# Patient Record
Sex: Female | Born: 1960 | Race: White | Hispanic: No | Smoking: Former smoker
Health system: Southern US, Academic
[De-identification: ages and names within clinical notes are randomized; demographics above are authoritative.]

## PROBLEM LIST (undated history)

## (undated) DIAGNOSIS — H409 Unspecified glaucoma: Secondary | ICD-10-CM

## (undated) DIAGNOSIS — E079 Disorder of thyroid, unspecified: Secondary | ICD-10-CM

## (undated) DIAGNOSIS — K219 Gastro-esophageal reflux disease without esophagitis: Secondary | ICD-10-CM

## (undated) DIAGNOSIS — I10 Essential (primary) hypertension: Secondary | ICD-10-CM

## (undated) DIAGNOSIS — H401330 Pigmentary glaucoma, bilateral, stage unspecified: Secondary | ICD-10-CM

## (undated) DIAGNOSIS — E785 Hyperlipidemia, unspecified: Secondary | ICD-10-CM

## (undated) DIAGNOSIS — Z972 Presence of dental prosthetic device (complete) (partial): Secondary | ICD-10-CM

## (undated) DIAGNOSIS — R32 Unspecified urinary incontinence: Secondary | ICD-10-CM

## (undated) DIAGNOSIS — J45909 Unspecified asthma, uncomplicated: Secondary | ICD-10-CM

## (undated) DIAGNOSIS — D649 Anemia, unspecified: Secondary | ICD-10-CM

## (undated) DIAGNOSIS — J309 Allergic rhinitis, unspecified: Secondary | ICD-10-CM

## (undated) DIAGNOSIS — G2581 Restless legs syndrome: Secondary | ICD-10-CM

## (undated) DIAGNOSIS — E063 Autoimmune thyroiditis: Secondary | ICD-10-CM

## (undated) DIAGNOSIS — T7840XA Allergy, unspecified, initial encounter: Secondary | ICD-10-CM

## (undated) DIAGNOSIS — R400 Somnolence: Secondary | ICD-10-CM

## (undated) DIAGNOSIS — J189 Pneumonia, unspecified organism: Secondary | ICD-10-CM

## (undated) DIAGNOSIS — K317 Polyp of stomach and duodenum: Secondary | ICD-10-CM

## (undated) DIAGNOSIS — I499 Cardiac arrhythmia, unspecified: Secondary | ICD-10-CM

## (undated) DIAGNOSIS — B019 Varicella without complication: Secondary | ICD-10-CM

## (undated) DIAGNOSIS — M1990 Unspecified osteoarthritis, unspecified site: Secondary | ICD-10-CM

## (undated) DIAGNOSIS — I471 Supraventricular tachycardia, unspecified: Secondary | ICD-10-CM

## (undated) DIAGNOSIS — S83206A Unspecified tear of unspecified meniscus, current injury, right knee, initial encounter: Secondary | ICD-10-CM

## (undated) DIAGNOSIS — E039 Hypothyroidism, unspecified: Secondary | ICD-10-CM

## (undated) DIAGNOSIS — Z8719 Personal history of other diseases of the digestive system: Secondary | ICD-10-CM

## (undated) DIAGNOSIS — N3941 Urge incontinence: Secondary | ICD-10-CM

## (undated) DIAGNOSIS — K760 Fatty (change of) liver, not elsewhere classified: Secondary | ICD-10-CM

## (undated) DIAGNOSIS — J4540 Moderate persistent asthma, uncomplicated: Secondary | ICD-10-CM

## (undated) DIAGNOSIS — B029 Zoster without complications: Secondary | ICD-10-CM

## (undated) DIAGNOSIS — K449 Diaphragmatic hernia without obstruction or gangrene: Secondary | ICD-10-CM

## (undated) DIAGNOSIS — R490 Dysphonia: Secondary | ICD-10-CM

## (undated) DIAGNOSIS — F32A Depression, unspecified: Secondary | ICD-10-CM

## (undated) DIAGNOSIS — I83893 Varicose veins of bilateral lower extremities with other complications: Secondary | ICD-10-CM

## (undated) DIAGNOSIS — D259 Leiomyoma of uterus, unspecified: Secondary | ICD-10-CM

## (undated) HISTORY — DX: Gastro-esophageal reflux disease without esophagitis: K21.9

## (undated) HISTORY — DX: Varicella without complication: B01.9

## (undated) HISTORY — DX: Anemia, unspecified: D64.9

## (undated) HISTORY — DX: Zoster without complications: B02.9

## (undated) HISTORY — DX: Pigmentary glaucoma, bilateral, stage unspecified: H40.1330

## (undated) HISTORY — DX: Leiomyoma of uterus, unspecified: D25.9

## (undated) HISTORY — DX: Allergy, unspecified, initial encounter: T78.40XA

## (undated) HISTORY — DX: Unspecified osteoarthritis, unspecified site: M19.90

## (undated) HISTORY — DX: Unspecified urinary incontinence: R32

## (undated) HISTORY — DX: Polyp of stomach and duodenum: K31.7

## (undated) HISTORY — DX: Diaphragmatic hernia without obstruction or gangrene: K44.9

## (undated) HISTORY — PX: EYE SURGERY: SHX253

## (undated) HISTORY — PX: HX SINUS SURGERY: 2100001108

## (undated) HISTORY — PX: HX LAP CHOLECYSTECTOMY: SHX56

## (undated) HISTORY — DX: Unspecified glaucoma: H40.9

## (undated) HISTORY — DX: Disorder of thyroid, unspecified: E07.9

---

## 1998-01-11 HISTORY — PX: NASAL SINUS SURGERY: SHX719

## 2000-01-12 HISTORY — PX: CHOLECYSTECTOMY, LAPAROSCOPIC: SHX56

## 2000-01-12 HISTORY — PX: CHOLECYSTECTOMY: SHX55

## 2012-01-12 HISTORY — PX: COLONOSCOPY: SHX174

## 2015-05-03 ENCOUNTER — Encounter (HOSPITAL_BASED_OUTPATIENT_CLINIC_OR_DEPARTMENT_OTHER): Payer: Self-pay | Admitting: Emergency Medicine

## 2015-05-03 ENCOUNTER — Emergency Department (HOSPITAL_BASED_OUTPATIENT_CLINIC_OR_DEPARTMENT_OTHER)
Admission: EM | Admit: 2015-05-03 | Discharge: 2015-05-04 | Disposition: A | Discharge status: Home / Self Care | Payer: BLUE CROSS/BLUE SHIELD | Attending: Emergency Medicine | Admitting: Emergency Medicine

## 2015-05-03 DIAGNOSIS — M549 Dorsalgia, unspecified: Secondary | ICD-10-CM | POA: Insufficient documentation

## 2015-05-03 DIAGNOSIS — Z9049 Acquired absence of other specified parts of digestive tract: Secondary | ICD-10-CM | POA: Insufficient documentation

## 2015-05-03 DIAGNOSIS — R1011 Right upper quadrant pain: Secondary | ICD-10-CM | POA: Insufficient documentation

## 2015-05-03 DIAGNOSIS — J45909 Unspecified asthma, uncomplicated: Secondary | ICD-10-CM | POA: Insufficient documentation

## 2015-05-03 DIAGNOSIS — R10A0 Flank pain, unspecified side: Secondary | ICD-10-CM

## 2015-05-03 DIAGNOSIS — Z79899 Other long term (current) drug therapy: Secondary | ICD-10-CM | POA: Diagnosis not present

## 2015-05-03 DIAGNOSIS — R109 Unspecified abdominal pain: Secondary | ICD-10-CM

## 2015-05-03 HISTORY — DX: Disorder of thyroid, unspecified: E07.9

## 2015-05-03 HISTORY — DX: Unspecified asthma, uncomplicated: J45.909

## 2015-05-03 LAB — URINALYSIS, ROUTINE W REFLEX MICROSCOPIC
Bilirubin Urine: NEGATIVE
Glucose, UA: NEGATIVE mg/dL
Hgb urine dipstick: NEGATIVE
Ketones, ur: NEGATIVE mg/dL
Leukocytes, UA: NEGATIVE
Nitrite: NEGATIVE
Protein, ur: NEGATIVE mg/dL
Specific Gravity, Urine: 1.004 — ABNORMAL LOW (ref 1.005–1.030)
pH: 5.5 (ref 5.0–8.0)

## 2015-05-03 LAB — COMPREHENSIVE METABOLIC PANEL
ALT: 27 U/L (ref 14–54)
AST: 27 U/L (ref 15–41)
Albumin: 4.2 g/dL (ref 3.5–5.0)
Alkaline Phosphatase: 61 U/L (ref 38–126)
Anion gap: 13 (ref 5–15)
BUN: 17 mg/dL (ref 6–20)
CO2: 21 mmol/L — ABNORMAL LOW (ref 22–32)
Calcium: 9.1 mg/dL (ref 8.9–10.3)
Chloride: 106 mmol/L (ref 101–111)
Creatinine, Ser: 0.77 mg/dL (ref 0.44–1.00)
GFR calc Af Amer: >60 mL/min (ref >60)
GFR calc non Af Amer: >60 mL/min (ref >60)
Glucose, Bld: 91 mg/dL (ref 65–99)
Potassium: 3.8 mmol/L (ref 3.5–5.1)
Sodium: 140 mmol/L (ref 135–145)
Total Bilirubin: 0.4 mg/dL (ref 0.3–1.2)
Total Protein: 7.7 g/dL (ref 6.5–8.1)

## 2015-05-03 LAB — CBC WITH DIFFERENTIAL/PLATELET
Basophils Absolute: 0.1 10*3/uL (ref 0.0–0.1)
Basophils Relative: 1 %
Eosinophils Absolute: 0.4 10*3/uL (ref 0.0–0.7)
Eosinophils Relative: 4 %
HCT: 37.4 % (ref 36.0–46.0)
Hemoglobin: 12.4 g/dL (ref 12.0–15.0)
Lymphocytes Relative: 27 %
Lymphs Abs: 2.7 10*3/uL (ref 0.7–4.0)
MCH: 34.3 pg — ABNORMAL HIGH (ref 26.0–34.0)
MCHC: 33.2 g/dL (ref 30.0–36.0)
MCV: 103.3 fL — ABNORMAL HIGH (ref 78.0–100.0)
Monocytes Absolute: 0.8 10*3/uL (ref 0.1–1.0)
Monocytes Relative: 8 %
Neutro Abs: 6.3 10*3/uL (ref 1.7–7.7)
Neutrophils Relative %: 60 %
Platelets: 390 10*3/uL (ref 150–400)
RBC: 3.62 MIL/uL — ABNORMAL LOW (ref 3.87–5.11)
RDW: 11.8 % (ref 11.5–15.5)
WBC: 10.3 10*3/uL (ref 4.0–10.5)

## 2015-05-03 NOTE — ED Provider Notes (Signed)
CSN: [id number]     Arrival date & time 05/03/15  2215 History  By signing my name below, I, Cathy Baxter, attest that this documentation has been prepared under the direction and in the presence of Everlene Balls, MD . Electronically Signed: Rowan Baxter, Scribe. 05/03/2015. 11:33 PM.   Chief Complaint  Patient presents with  . Flank Pain   The history is provided by the patient. No language interpreter was used.   HPI Comments:  Cathy Baxter is a 55 y.o. female who presents to the Emergency Department complaining of intermittent stabbing right flank pain for the past 3-4 months. Friend reports pt can drink one beer with occasional relief; no exacerbating factors noted. Pt reports associated back pain today. She has not seen her PCP for her symptoms. Pt reports past cholecystectomy. Denies vomiting, diarrhea, dysuria, hematuria, fever, irregular vaginal bleeding, or irregular vaginal discharge.  Past Medical History  Diagnosis Date  . Thyroid disease   . Asthma    Past Surgical History  Procedure Laterality Date  . Cholecystectomy     History reviewed. No pertinent family history. Social History  Substance Use Topics  . Smoking status: Never Smoker   . Smokeless tobacco: None  . Alcohol Use: 1.2 - 1.8 oz/week    2-3 Cans of beer per week     Comment: 2 -3 days    OB History    No data available     Review of Systems  Gastrointestinal: Negative for vomiting and diarrhea.  Genitourinary: Positive for flank pain. Negative for dysuria, hematuria, vaginal bleeding and vaginal discharge.  Musculoskeletal: Positive for back pain.  All other systems reviewed and are negative.   Allergies  Percocet; Amoxicillin; and Demerol  Home Medications   Prior to Admission medications   Medication Sig Start Date End Date Taking? Authorizing Provider  levothyroxine (SYNTHROID, LEVOTHROID) 50 MCG tablet Take 50 mcg by mouth daily before breakfast.   Yes Historical Provider, MD   mometasone-formoterol (DULERA) 100-5 MCG/ACT AERO Inhale 2 puffs into the lungs 2 (two) times daily.   Yes Historical Provider, MD   BP 115/82 mmHg  Pulse 85  Temp(Src) 97.8 F (36.6 C) (Oral)  Resp 18  Ht 5\' 3"  (1.6 m)  Wt 160 lb (72.576 kg)  BMI 28.35 kg/m2  SpO2 99% Physical Exam  Constitutional: She is oriented to person, place, and time. She appears well-developed and well-nourished. No distress.  HENT:  Head: Normocephalic and atraumatic.  Nose: Nose normal.  Mouth/Throat: Oropharynx is clear and moist. No oropharyngeal exudate.  Eyes: Conjunctivae and EOM are normal. Pupils are equal, round, and reactive to light. No scleral icterus.  Neck: Normal range of motion. Neck supple. No JVD present. No tracheal deviation present. No thyromegaly present.  Cardiovascular: Normal rate, regular rhythm and normal heart sounds.  Exam reveals no gallop and no friction rub.   No murmur heard. Pulmonary/Chest: Effort normal and breath sounds normal. No respiratory distress. She has no wheezes. She exhibits no tenderness.  Abdominal: Soft. Bowel sounds are normal. She exhibits no distension and no mass. There is no tenderness. There is no rebound and no guarding.  Musculoskeletal: Normal range of motion. She exhibits no edema or tenderness.  Lymphadenopathy:    She has no cervical adenopathy.  Neurological: She is alert and oriented to person, place, and time. No cranial nerve deficit. She exhibits normal muscle tone.  Skin: Skin is warm and dry. No rash noted. No erythema. No pallor.  Nursing note and  vitals reviewed.   ED Course  Procedures  DIAGNOSTIC STUDIES:  Oxygen Saturation is 96% on RA, adequate by my interpretation.    COORDINATION OF CARE:  11:29 PM Will evaluate blood work. Recommended pt follow-up with OB/GYN for vaginal [place]. Discussed treatment plan with pt at bedside and pt agreed to plan.  Labs Review Labs Reviewed  CBC WITH DIFFERENTIAL/PLATELET - Abnormal; Notable  for the following:    RBC 3.62 (*)    MCV 103.3 (*)    MCH 34.3 (*)    All other components within normal limits  COMPREHENSIVE METABOLIC PANEL - Abnormal; Notable for the following:    CO2 21 (*)    All other components within normal limits  URINALYSIS, ROUTINE W REFLEX MICROSCOPIC (NOT AT [facility]) - Abnormal; Notable for the following:    Specific Gravity, Urine 1.004 (*)    All other components within normal limits  LIPASE, BLOOD    Imaging Review No results found. I have personally reviewed and evaluated these images and lab results as part of my medical decision-making.   EKG Interpretation None      MDM   Final diagnoses:  Flank pain   Patient presents emergency department for abdominal pain. She states this has been going on for several months. Laboratory studies are unremarkable, physical exam reveals no tenderness. Patient advised to follow-up with primary care physician for further evaluation and possible vaginal ultrasound to evaluate for fibroids or cysts. She images good understanding. She appears well in no acute distress, vital signs were within her normal limits and she is safe for discharge.   I personally performed the services described in this documentation, which was scribed in my presence. The recorded information has been reviewed and is accurate.      Everlene Balls, MD 05/04/15 [phone]

## 2015-05-03 NOTE — ED Notes (Signed)
Patient reports that she has had right flank pain x "months" that patient. Is giggling and in no distress in triage. The patient reports that she came tonight because " I did not want to live with this all my life"

## 2015-05-03 NOTE — Discharge Instructions (Signed)
Abdominal Pain, Adult Cathy Baxter, your blood work and urine results are normal.  See a primary care physician within 3 days for close follow up.  Take tylenol or ibuprofen as needed for pain control.  If symptoms worsen, come back to the ED immediately. Thank you. Many things can cause belly (abdominal) pain. Most times, the belly pain is not dangerous. Many cases of belly pain can be watched and treated at home. HOME CARE   Do not take medicines that help you go poop (laxatives) unless told to by your doctor.  Only take medicine as told by your doctor.  Eat or drink as told by your doctor. Your doctor will tell you if you should be on a special diet. GET HELP IF:  You do not know what is causing your belly pain.  You have belly pain while you are sick to your stomach (nauseous) or have runny poop (diarrhea).  You have pain while you pee or poop.  Your belly pain wakes you up at night.  You have belly pain that gets worse or better when you eat.  You have belly pain that gets worse when you eat fatty foods.  You have a fever. GET HELP RIGHT AWAY IF:   The pain does not go away within 2 hours.  You keep throwing up (vomiting).  The pain changes and is only in the right or left part of the belly.  You have bloody or tarry looking poop. MAKE SURE YOU:   Understand these instructions.  Will watch your condition.  Will get help right away if you are not doing well or get worse.   This information is not intended to replace advice given to you by your health care provider. Make sure you discuss any questions you have with your health care provider.   Document Released: 06/16/2007 Document Revised: 01/18/2014 Document Reviewed: 09/06/2012 Elsevier Interactive Patient Education 2016 [employer]  Flank Pain Flank pain is pain in your side. The flank is the area of your side between your upper belly (abdomen) and your back. Pain in this area can be caused by many different  things. [place] care and treatment will depend on the cause of your pain.  Rest as told by your doctor.  Drink enough fluids to keep your pee (urine) clear or pale yellow.  Only take medicine as told by your doctor.  Tell your doctor about any changes in your pain.  Follow up with your doctor. GET HELP RIGHT AWAY IF:   Your pain does not get better with medicine.   You have new symptoms or your symptoms get worse.  Your pain gets worse.   You have belly (abdominal) pain.   You are short of breath.   You always feel sick to your stomach (nauseous).   You keep throwing up (vomiting).   You have puffiness (swelling) in your belly.   You feel light-headed or you pass out (faint).   You have blood in your pee.  You have a fever or lasting symptoms for more than 2-3 days.  You have a fever and your symptoms suddenly get worse. MAKE SURE YOU:   Understand these instructions.  Will watch your condition.  Will get help right away if you are not doing well or get worse.   This information is not intended to replace advice given to you by your health care provider. Make sure you discuss any questions you have with your health care provider.  Document Released: 10/07/2007 Document Revised: 01/18/2014 Document Reviewed: 08/12/2011 Elsevier Interactive Patient Education [employer].

## 2015-05-03 NOTE — ED Notes (Addendum)
Denies any sx at this time. Reports intermitant RUQ pain for months. Has had pain and nausea. (denies: vd, fever, bleeding, urinary or vaginal sx, last BM yesterday, "thought she was constipated", last ate 1900. H/o gall bladder surgery and hiatal hernia. Alert, NAD, calm, interactive.

## 2015-05-04 LAB — LIPASE, BLOOD: Lipase: 21 U/L (ref 11–51)

## 2015-05-04 NOTE — ED Notes (Signed)
Pt given d/c instructions as per chart. Verbalizes understanding. No questions. 

## 2015-05-08 ENCOUNTER — Inpatient Hospital Stay: Payer: BLUE CROSS/BLUE SHIELD

## 2015-05-09 ENCOUNTER — Ambulatory Visit: Payer: BLUE CROSS/BLUE SHIELD | Attending: Physician Assistant | Admitting: Physician Assistant

## 2015-05-09 VITALS — BP 118/81 | HR 70 | Temp 98.3°F | Resp 16 | Ht 63.0 in | Wt 165.0 lb

## 2015-05-09 DIAGNOSIS — Z79899 Other long term (current) drug therapy: Secondary | ICD-10-CM | POA: Insufficient documentation

## 2015-05-09 DIAGNOSIS — Z9049 Acquired absence of other specified parts of digestive tract: Secondary | ICD-10-CM | POA: Diagnosis not present

## 2015-05-09 DIAGNOSIS — G8929 Other chronic pain: Secondary | ICD-10-CM | POA: Diagnosis not present

## 2015-05-09 DIAGNOSIS — R1031 Right lower quadrant pain: Secondary | ICD-10-CM | POA: Insufficient documentation

## 2015-05-09 DIAGNOSIS — Z880 Allergy status to penicillin: Secondary | ICD-10-CM | POA: Diagnosis not present

## 2015-05-09 MED ORDER — FLUTICASONE PROPIONATE 50 MCG/ACT NA SUSP: 2.0000 | Freq: Every day | NASAL | Status: DC

## 2015-05-09 MED ORDER — OMEPRAZOLE 40 MG PO CPDR: 40.0000 mg | DELAYED_RELEASE_CAPSULE | Freq: Every day | ORAL | Status: DC

## 2015-05-09 NOTE — Progress Notes (Signed)
Patient's here for ED f/up flank pain mid and lower back  rating pain at 8/10.  Patient is also experiencing pain in L hip, that started this morning, rated pain 8/10.

## 2015-05-09 NOTE — Progress Notes (Signed)
Cathy Baxter  [id number]  [id number]  DOB - 08-Apr-1960  Chief Complaint  Patient presents with  . Follow-up  . Flank Pain       Subjective:   Cathy Baxter is a 55 y.o. female here today for establishment of care. She was in the emergency department on 05/03/2015. She is new to this area. She states that maybe one year ago she had some abdominal pain and was seen by gastroenterology. She had endoscopy and colonoscopy which looked okay. Since that time she's had a CT of the abdomen and that was reportedly normal as well. I do not have any records, again she recently moved to this area. Paragraph on 422 she went to our emergency department with complaints of stabbing right flank and abdominal pain. Located to the right lower quadrant through to her back occasionally. It's been going on for the last 3-4 months almost daily. No nausea or vomiting. No dysuria. No fevers. No vaginal bleeding or vaginal discharge. No injury or trauma. She's been afebrile. She stopped taking Protonix and Bentyl lately because she didn't feel like they were working. Also she states that the Protonix cost too much. In the emergency department her labs looked okay. Her urinalysis looked okay. She does not describe anything at that visit.  ROS: GEN: denies fever or chills, denies change in weight Skin: denies lesions or rashes HEENT: denies headache, earache, epistaxis, sore throat, or neck pain LUNGS: denies SHOB, dyspnea, PND, orthopnea CV: denies CP or palpitations ABD: denies abd pain, N or V EXT: denies muscle spasms or swelling; no pain in lower ext, no weakness NEURO: denies numbness or tingling, denies sz, stroke or TIA  Problem  Abdominal Pain, Chronic, Right Lower Quadrant    ALLERGIES: Allergies  Allergen Reactions  . Percocet [Oxycodone-Acetaminophen] Shortness Of Breath  . Amoxicillin Hives  . Demerol [Meperidine] Palpitations    PAST MEDICAL HISTORY: Past Medical History    Diagnosis Date  . Thyroid disease   . Asthma     PAST SURGICAL HISTORY: Past Surgical History  Procedure Laterality Date  . Cholecystectomy      MEDICATIONS AT HOME: Prior to Admission medications   Medication Sig Start Date End Date Taking? Authorizing Provider  cetirizine (ZYRTEC) 10 MG tablet Take 10 mg by mouth daily.   Yes Historical Provider, MD  dicyclomine (BENTYL) 20 MG tablet Take 20 mg by mouth every 6 (six) hours.   Yes Historical Provider, MD  levothyroxine (SYNTHROID, LEVOTHROID) 50 MCG tablet Take 50 mcg by mouth daily before breakfast.   Yes Historical Provider, MD  mometasone-formoterol (DULERA) 100-5 MCG/ACT AERO Inhale 2 puffs into the lungs 2 (two) times daily.   Yes Historical Provider, MD  fluticasone (FLONASE) 50 MCG/ACT nasal spray Place 2 sprays into both nostrils daily. 05/09/15   Tiffany Daneil Dan, PA-C  omeprazole (PRILOSEC) 40 MG capsule Take 1 capsule (40 mg total) by mouth daily. 05/09/15   Tiffany Daneil Dan, PA-C  pantoprazole (PROTONIX) 40 MG tablet Take 40 mg by mouth daily. Reported on 05/09/2015    Historical Provider, MD     Objective:   Filed Vitals:   05/09/15 1401  BP: 118/81  Pulse: 70  Temp: 98.3 F (36.8 C)  TempSrc: Oral  Resp: 16  Height: 5\' 3"  (1.6 m)  Weight: 165 lb (74.844 kg)  SpO2: 98%    Exam General appearance : Awake, alert, not in any distress. Speech Clear. Not toxic looking HEENT: Atraumatic and Normocephalic, pupils equally reactive  to light and accomodation Neck: supple, no JVD. No cervical lymphadenopathy.   Abdomen: Bowel sounds present, slightly diffusely tender and not distended with no guarding, rigidity or rebound. Extremities: B/L Lower Ext shows no edema, both legs are warm to touch Neurology: Awake alert, and oriented X 3, CN II-XII intact, Non focal Skin:No Rash   Assessment & Plan  1. Acute on Chronic RLQ Abdominal Pain/right flank pain unclear etiology  -Restart Bentyl at higher dose  -Restart  PPI  -GYN referral  -Made need GI referral if sxs persists   Return in about 4 weeks (around 06/06/2015).  The patient was given clear instructions to go to ER or return to medical center if symptoms don't improve, worsen or new problems develop. The patient verbalized understanding. The patient was told to call to get lab results if they haven't heard anything in the next week.   This note has been created with Surveyor, quantity. Any transcriptional errors are unintentional.    Zettie Pho, PA-C [facility] [place], [place]   05/09/2015, 2:33 PM

## 2015-05-14 ENCOUNTER — Encounter: Payer: Self-pay | Admitting: Obstetrics and Gynecology

## 2015-06-09 ENCOUNTER — Ambulatory Visit: Payer: BLUE CROSS/BLUE SHIELD | Admitting: Family Medicine

## 2015-06-12 ENCOUNTER — Encounter: Payer: BLUE CROSS/BLUE SHIELD | Admitting: Obstetrics and Gynecology

## 2015-06-13 ENCOUNTER — Ambulatory Visit: Payer: BLUE CROSS/BLUE SHIELD | Admitting: Family Medicine

## 2015-07-13 ENCOUNTER — Emergency Department (HOSPITAL_COMMUNITY): Payer: BLUE CROSS/BLUE SHIELD

## 2015-07-13 ENCOUNTER — Emergency Department (HOSPITAL_COMMUNITY)
Admission: EM | Admit: 2015-07-13 | Discharge: 2015-07-13 | Disposition: A | Discharge status: Home / Self Care | Payer: BLUE CROSS/BLUE SHIELD | Attending: Emergency Medicine | Admitting: Emergency Medicine

## 2015-07-13 ENCOUNTER — Encounter (HOSPITAL_COMMUNITY): Payer: Self-pay | Admitting: *Deleted

## 2015-07-13 DIAGNOSIS — Z79899 Other long term (current) drug therapy: Secondary | ICD-10-CM | POA: Insufficient documentation

## 2015-07-13 DIAGNOSIS — Y9389 Activity, other specified: Secondary | ICD-10-CM | POA: Insufficient documentation

## 2015-07-13 DIAGNOSIS — Z7951 Long term (current) use of inhaled steroids: Secondary | ICD-10-CM | POA: Insufficient documentation

## 2015-07-13 DIAGNOSIS — Y990 Civilian activity done for income or pay: Secondary | ICD-10-CM | POA: Diagnosis not present

## 2015-07-13 DIAGNOSIS — Y929 Unspecified place or not applicable: Secondary | ICD-10-CM | POA: Insufficient documentation

## 2015-07-13 DIAGNOSIS — S82832A Other fracture of upper and lower end of left fibula, initial encounter for closed fracture: Secondary | ICD-10-CM | POA: Insufficient documentation

## 2015-07-13 DIAGNOSIS — J45909 Unspecified asthma, uncomplicated: Secondary | ICD-10-CM | POA: Insufficient documentation

## 2015-07-13 DIAGNOSIS — X501XXA Overexertion from prolonged static or awkward postures, initial encounter: Secondary | ICD-10-CM | POA: Diagnosis not present

## 2015-07-13 DIAGNOSIS — S8992XA Unspecified injury of left lower leg, initial encounter: Secondary | ICD-10-CM | POA: Diagnosis present

## 2015-07-13 MED ORDER — HYDROCODONE-ACETAMINOPHEN 5-325 MG PO TABS: 1.0000 | ORAL_TABLET | Freq: Four times a day (QID) | ORAL | Status: DC | PRN

## 2015-07-13 MED ORDER — KETOROLAC TROMETHAMINE 60 MG/2ML IM SOLN
60.0000 mg | Freq: Once | INTRAMUSCULAR | Status: AC
  Administered 2015-07-13: 60 mg via INTRAMUSCULAR
  Filled 2015-07-13: qty 2

## 2015-07-13 MED ORDER — HYDROCODONE-ACETAMINOPHEN 5-325 MG PO TABS
1.0000 | ORAL_TABLET | Freq: Once | ORAL | Status: AC
  Administered 2015-07-13: 1 via ORAL
  Filled 2015-07-13: qty 1

## 2015-07-13 NOTE — Discharge Instructions (Signed)
Fibular Fracture With Rehab °The fibula is the smaller of the two lower leg bones and is vulnerable to breaks (fracture). Fibular fractures may go fully through the bone (complete) or partially (incomplete). The bone fragments are rarely out of alignment (displaced fracture). Fibula fractures may occur anywhere along the bone. However, this document only discusses fractures that do not involve a leg joint. Fibular fractures are not often a severe injury because the bone supports only about 17% of the body weight. °SYMPTOMS  °· Moderate to severe pain in the lower leg. °· Tenderness and swelling in the leg or calf. °· Bleeding and/or bruising (contusion) in the leg. °· Inability to bear weight on the injured extremity. °· Visible deformity, if the fracture is displaced. °· Numbness and coldness in the leg and foot, beyond the fracture site, if blood supply is impaired. °CAUSES  °Fractures occur when a force is placed on the bone that is greater than it can withstand. Common causes of fibular fracture include: °· Direct hit (trauma) (i.e., hockey or lacrosse check to the lower leg). °· Stress fracture (weakening of the bone from repeated stress). °· Indirect injury, caused by twisting, turning quickly, or violent muscle contraction. °RISK INCREASES WITH: °· Contact sports (i.e., football, soccer, lacrosse, hockey). °· Sports that can cause twisted ankle injury (i.e., skiing, basketball). °· Bony abnormalities (i.e., osteoporosis or bone tumors). °· Metabolism disorders, hormone problems, and nutrition deficiency and disorders (i.e., anorexia and bulimia). °· Poor strength and flexibility. °PREVENTION  °· Warm up and stretch properly before activity. °· Maintain physical fitness: °¨ Strength, flexibility, and endurance. °¨ Cardiovascular fitness. °· Wear properly fitted and padded protective equipment (i.e., shin guards for soccer). °PROGNOSIS  °If treated properly, fibular fractures usually heal in 4 to 6 weeks.    °RELATED COMPLICATIONS  °· Failure of bone to heal (nonunion). °· Bone heals in a poor position (malunion). °· Increased pressure inside the leg (compartment syndrome) due to injury that disrupts the blood supply to the leg and foot and injures the nerves and muscles (uncommon). °· Shortening of the injured bones. °· Hindrance of normal bone growth in children. °· Risks of surgery: infection, bleeding, injury to nerves (numbness, weakness, paralysis), need for further surgery. °· Longer healing time if activity is resumed too soon. °TREATMENT °Treatment first involves ice, medicine, and elevation of the leg to reduce pain and inflammation. People with fibular fractures are advised to walk using crutches. A brace or walking boot may be given to restrain the injured leg and allow for healing. Sometimes, surgery is needed to place a rod, plate, or screws in the bones in order to fix the fracture. After surgery, the leg is restrained. After restraint (with or without surgery), it is important to complete strengthening and stretching exercises to regain strength and a full range of motion. Exercises may be completed at home or with a therapist. °MEDICATION  °· If pain medicine is needed, nonsteroidal anti-inflammatory medicines (aspirin and ibuprofen), or other minor pain relievers (acetaminophen), are often advised. °· Do not take pain medicine for 7 days before surgery. °· Prescription pain relievers may be given if your health care provider thinks they are needed. Use only as directed and only as much as you need. °SEEK MEDICAL CARE IF: °· Symptoms get worse or do not improve in 2 weeks, despite treatment. °· The following occur after restraint or surgery. (Report any of these signs immediately): °¨ Swelling above or below the fracture site. °¨ Severe, persistent pain. °¨   Blue or gray skin below the fracture site, especially under the toenails. Numbness or loss of feeling below the fracture site. °¨ New, unexplained  symptoms develop. (Drugs used in treatment may produce side effects.) °EXERCISES  °RANGE OF MOTION (ROM) AND STRETCHING EXERCISES - Fibular Fracture °These exercises may help you when beginning to recover from your injury. Your symptoms may go away with or without further involvement from your physician, physical therapist or athletic trainer. While completing these exercises, remember:  °· Restoring tissue flexibility helps normal motion to return to the joints. This allows healthier, less painful movement and activity. °· An effective stretch should be held for at least 30 seconds. °· A stretch should never be painful. You should only feel a gentle lengthening or release in the stretched tissue. °RANGE OF MOTION - Dorsi/Plantar Flexion °· While sitting with your right / left knee straight, draw the top of your foot upwards by flexing your ankle. Then reverse the motion, pointing your toes downward. °· Hold each position for __________ seconds. °· After completing your first set of exercises, repeat this exercise with your knee bent. °Repeat __________ times. Complete this exercise __________ times per day.  °STRETCH - Gastrocsoleus  °· Sit with your right / left leg extended. Holding onto both ends of a belt or towel, loop it around the ball of your foot. °· Keeping your right / left ankle and foot relaxed and your knee straight, pull your foot and ankle toward you using the belt. °· You should feel a gentle stretch behind your calf or knee. Hold this position for __________ seconds. °Repeat __________ times. Complete this stretch __________ times per day.  °RANGE OF MOTION- Ankle Plantar Flexion  °· Sit with your right / left leg crossed over your opposite knee. °· Use your opposite hand to pull the top of your foot and toes toward you. °· You should feel a gentle stretch on the top of your foot and ankle. Hold this position for __________ seconds. °Repeat __________ times. Complete __________ times per day.    °RANGE OF MOTION - Ankle Eversion °· Sit with your right / left ankle crossed over your opposite knee. °· Grip your foot with your opposite hand, placing your thumb on the top of your foot and your fingers across the bottom of your foot. °· Gently push your foot downward with a slight rotation so your littlest toes rise slightly toward the ceiling. °· You should feel a gentle stretch on the inside of your ankle. Hold the stretch for __________ seconds. °Repeat __________ times. Complete this exercise __________ times per day.  °RANGE OF MOTION - Ankle Inversion °· Sit with your right / left ankle crossed over your opposite knee. °· Grip your foot with your opposite hand, placing your thumb on the bottom of your foot and your fingers across the top of your foot. °· Gently pull your foot so the smallest toe comes toward you and your thumb pushes the inside of the ball of your foot away from you. °· You should feel a gentle stretch on the outside of your ankle. Hold the stretch for __________ seconds. °Repeat __________ times. Complete this exercise __________ times per day.  °RANGE OF MOTION - Ankle Alphabet °· Imagine your right / left big toe is a pen. °· Keeping your hip and knee still, write out the entire alphabet with your "pen." Make the letters as large as you can, without increasing any discomfort. °Repeat __________ times. Complete this exercise   __________ times per day.  °RANGE OF MOTION - Ankle Dorsiflexion, Active Assisted °· Remove your shoes and sit on a chair, preferably not on a carpeted surface. °· Place your right / left foot on the floor, directly under your knee. Extend your opposite leg for support. °· Keeping your heel down, slide your right / left foot back toward the chair, until you feel a stretch at your ankle or calf. If you do not feel a stretch, slide your bottom forward to the edge of the chair, while still keeping your heel down. °· Hold this stretch for __________ seconds. °Repeat  __________ times. Complete this stretch __________ times per day.  °STRENGTHENING EXERCISES - Fibular Fracture °These exercises may help you when beginning to recover from your injury. They may resolve your symptoms with or without further involvement from your physician, physical therapist or athletic trainer. While completing these exercises, remember:  °· Muscles can gain both the endurance and the strength needed for everyday activities through controlled exercises. °· Complete these exercises as instructed by your physician, physical therapist or athletic trainer. Increase the resistance and repetitions only as guided. °· You may experience muscle soreness or fatigue, but the pain or discomfort you are trying to eliminate should never worsen during these exercises. If this pain does get worse, stop and make certain you are following the directions exactly. If the pain is still present after adjustments, discontinue the exercise until you can discuss the trouble with your clinician. °STRENGTH - Dorsiflexors °· Secure a rubber exercise band or tubing to a fixed object (table, pole) and loop the other end around your right / left foot. °· Sit on the floor, facing the fixed object. The band should be slightly tense when your foot is relaxed. °· Slowly draw your foot back toward you, using your ankle and toes. °· Hold this position for __________ seconds. Slowly release the tension in the band and return your foot to the starting position. °Repeat __________ times. Complete this exercise __________ times per day.  °STRENGTH - Plantar-flexors °· Sit with your right / left leg extended. Holding onto both ends of a rubber exercise band or tubing, loop it around the ball of your foot. Keep a slight tension in the band. °· Slowly push your toes away from you, pointing them downward. °· Hold this position for __________ seconds. Return to the starting position slowly, controlling the tension in the band. °Repeat  __________ times. Complete this exercise __________ times per day.  °STRENGTH - Plantar-flexors, Standing  °· Stand with your feet shoulder width apart. Place your hands on a wall or table to steady yourself, using as little support as needed. °· Keeping your weight evenly spread over the width of your feet, rise up on your toes.* °· Hold this position for __________ seconds. °Repeat __________ times. Complete this exercise __________ times per day.  °*If this is too easy, shift your weight toward your right / left leg until you feel challenged. Ultimately, you may be asked to do this exercise while standing on your right / left foot only. °STRENGTH - Towel Curls °· Sit in a chair, on a non-carpeted surface. °· Place your foot on a towel, keeping your heel on the floor. °· Pull the towel toward your heel only by curling your toes. Keep your heel on the floor. °· If instructed by your physician, physical therapist or athletic trainer, add ____________________ at the end of the towel. °Repeat __________ times. Complete this exercise __________   times per day. °STRENGTH - Ankle Eversion °· Secure one end of a rubber exercise band or tubing to a fixed object (table, pole). Loop the other end around your foot, just before your toes. °· Place your fists between your knees. This will focus your strengthening at your ankle. °· Drawing the band across your opposite foot, away from the pole, slowly pull your little toe out and up. Make sure the band is positioned to resist the entire motion. °· Hold this position for __________ seconds. °· Return to the starting position slowly, controlling the tension in the band. °Repeat __________ times. Complete this exercise __________ times per day.  °STRENGTH - Ankle Inversion °· Secure one end of a rubber exercise band or tubing to a fixed object (table, pole). Loop the other end around your foot, just before your toes. °· Place your fists between your knees. This will focus your  strengthening at your ankle. °· Slowly, pull your big toe up and in, making sure the band is positioned to resist the entire motion. °· Hold this position for __________ seconds. °· Return to the starting position slowly, controlling the tension in the band. °Repeat __________ times. Complete this exercises __________ times per day.  °  °This information is not intended to replace advice given to you by your health care provider. Make sure you discuss any questions you have with your health care provider. °  °Document Released: 12/28/2004 Document Revised: 01/18/2014 Document Reviewed: 04/11/2008 °Elsevier Interactive Patient Education ©2016 [employer] ° °

## 2015-07-13 NOTE — ED Provider Notes (Addendum)
CSN: [id number]     Arrival date & time 07/13/15  1506 History   First MD Initiated Contact with Patient 07/13/15 1513     Chief Complaint  Patient presents with  . Knee Injury     (Consider location/radiation/quality/duration/timing/severity/associated sxs/prior Treatment) HPI Comments: Patient states that she has had pain in her left knee for the last several days and attempted to go to the clinic yesterday but they were close. However today while at work she turned which twisted her knee and she felt a pop with sudden severe pain in her left knee. She has been unable to ambulate since that time. The pain is significantly worse with bending the knee. She has never had anything like this in the past. No prior surgery to the knee  Patient is a 55 y.o. female presenting with knee pain. The history is provided by the patient.  Knee Pain Location:  Knee Time since incident:  2 days Injury: no   Knee location:  L knee Pain details:    Quality:  Shooting and throbbing   Radiates to:  L leg   Severity:  Severe   Onset quality:  Gradual   Duration:  2 days   Timing:  Constant   Progression:  Worsening Chronicity:  New Prior injury to area:  No Relieved by:  Nothing Worsened by:  Bearing weight, flexion and rotation Ineffective treatments:  NSAIDs Associated symptoms: decreased ROM, stiffness and swelling   Risk factors: obesity   Risk factors: no known bone disorder and no recent illness     Past Medical History  Diagnosis Date  . Thyroid disease   . Asthma    Past Surgical History  Procedure Laterality Date  . Cholecystectomy     No family history on file. Social History  Substance Use Topics  . Smoking status: Never Smoker   . Smokeless tobacco: Not on file  . Alcohol Use: 1.2 - 1.8 oz/week    2-3 Cans of beer per week     Comment: 2 -3 days    OB History    No data available     Review of Systems  Musculoskeletal: Positive for stiffness.  All other systems  reviewed and are negative.     Allergies  Percocet; Amoxicillin; and Demerol  Home Medications   Prior to Admission medications   Medication Sig Start Date End Date Taking? Authorizing Provider  cetirizine (ZYRTEC) 10 MG tablet Take 10 mg by mouth daily.    Historical Provider, MD  dicyclomine (BENTYL) 20 MG tablet Take 20 mg by mouth every 6 (six) hours.    Historical Provider, MD  fluticasone (FLONASE) 50 MCG/ACT nasal spray Place 2 sprays into both nostrils daily. 05/09/15   Brayton Caves, PA-C  levothyroxine (SYNTHROID, LEVOTHROID) 50 MCG tablet Take 50 mcg by mouth daily before breakfast.    Historical Provider, MD  mometasone-formoterol (DULERA) 100-5 MCG/ACT AERO Inhale 2 puffs into the lungs 2 (two) times daily.    Historical Provider, MD  omeprazole (PRILOSEC) 40 MG capsule Take 1 capsule (40 mg total) by mouth daily. 05/09/15   Tiffany Daneil Dan, PA-C  pantoprazole (PROTONIX) 40 MG tablet Take 40 mg by mouth daily. Reported on 05/09/2015    Historical Provider, MD   There were no vitals taken for this visit. Physical Exam  Constitutional: She is oriented to person, place, and time. She appears well-developed and well-nourished. She appears distressed.  Crying and appears to be in pain  HENT:  Head: Normocephalic and atraumatic.  Mouth/Throat: Oropharynx is clear and moist.  Eyes: Conjunctivae and EOM are normal. Pupils are equal, round, and reactive to light.  Neck: Normal range of motion. Neck supple.  Cardiovascular: Normal rate and intact distal pulses.   Pulmonary/Chest: Effort normal.  Musculoskeletal: She exhibits tenderness. She exhibits no edema.       Left knee: She exhibits decreased range of motion and swelling. She exhibits no deformity and no LCL laxity. Tenderness found. Lateral joint line tenderness noted. No medial joint line tenderness noted.       Legs: Neurological: She is alert and oriented to person, place, and time.  Skin: Skin is warm and dry. No rash  noted. No erythema.  Psychiatric: She has a normal mood and affect. Her behavior is normal.  Nursing note and vitals reviewed.   ED Course  Procedures (including critical care time) Labs Review Labs Reviewed - No data to display  Imaging Review Dg Knee Complete 4 Views Left  07/13/2015  CLINICAL DATA:  Acute on chronic knee pain after twist and fall at work today. Heard a pop. EXAM: LEFT KNEE - COMPLETE 4+ VIEW COMPARISON:  None. FINDINGS: Tiny bony fragment at fibular head with underlying donor site. No evidence of dislocation, or joint effusion. No evidence of arthropathy or other focal bone abnormality. Soft tissues are unremarkable. IMPRESSION: Age indeterminate tiny fibular head avulsion injury, recommend correlation with point tenderness. No dislocation. Electronically Signed   By: Elon Alas M.D.   On: 07/13/2015 16:02   I have personally reviewed and evaluated these images and lab results as part of my medical decision-making.   EKG Interpretation None      MDM   Final diagnoses:  Closed fracture fibula, head, left, initial encounter   Patient is a 55 year old female with a history of asthma and thyroid disease presenting today with worsening left knee pain. Negative and sore for the last few days but today while at work she twisted and felt a pop and since that time is unable to bear weight. Do a lot of walking and climbing ladders at work but cannot recall other than turning any other specific trauma. She will occasionally have pain in her knee but nothing like this.  Pain with flexion and extension of the left knee and the majority the pain is localized over the left meniscus and popliteal fossa.  Mild effusion on exam.  Imaging pending.  4:09 PM Imaging with possible fibular head avulsion.  She does have tenderness over that area.  Will place pt in knee immobilizer and f/u with ortho.  Blanchie Dessert, MD 07/13/15 1611  Blanchie Dessert, MD 07/13/15 1626

## 2015-07-13 NOTE — ED Notes (Signed)
See triage note.  Pt reports her L leg has been swollen and was going to be seen for it today after work.  Pt is A&Ox 4.

## 2015-07-13 NOTE — ED Notes (Signed)
Bed: [id number] Expected date:  Expected time:  Means of arrival:  Comments: EMS- knee injury

## 2015-07-13 NOTE — ED Notes (Signed)
Per EMS, pt picked up from [employer] where she works.  States she turned and heard a "pop" from her L knee.  EMS unable to assess pt's L knee because pt did not allow them to cut her pants.

## 2015-08-05 ENCOUNTER — Other Ambulatory Visit: Payer: Self-pay | Admitting: Orthopaedic Surgery

## 2015-08-05 DIAGNOSIS — M25562 Pain in left knee: Secondary | ICD-10-CM

## 2015-08-07 ENCOUNTER — Ambulatory Visit
Admission: RE | Admit: 2015-08-07 | Discharge: 2015-08-07 | Disposition: A | Discharge status: Home / Self Care | Payer: BLUE CROSS/BLUE SHIELD | Source: Ambulatory Visit | Attending: Orthopaedic Surgery | Admitting: Orthopaedic Surgery

## 2015-08-07 DIAGNOSIS — M25562 Pain in left knee: Secondary | ICD-10-CM

## 2015-08-12 HISTORY — PX: KNEE ARTHROSCOPY: SUR90

## 2015-08-14 ENCOUNTER — Ambulatory Visit (INDEPENDENT_AMBULATORY_CARE_PROVIDER_SITE_OTHER): Payer: Self-pay | Admitting: Ophthalmology

## 2015-09-18 ENCOUNTER — Ambulatory Visit (INDEPENDENT_AMBULATORY_CARE_PROVIDER_SITE_OTHER): Payer: BC Managed Care – PPO | Admitting: Ophthalmology

## 2015-09-18 DIAGNOSIS — H5213 Myopia, bilateral: Secondary | ICD-10-CM

## 2015-09-18 DIAGNOSIS — E039 Hypothyroidism, unspecified: Secondary | ICD-10-CM

## 2015-09-18 DIAGNOSIS — H401333 Pigmentary glaucoma, bilateral, severe stage: Secondary | ICD-10-CM

## 2015-09-18 DIAGNOSIS — H269 Unspecified cataract: Secondary | ICD-10-CM

## 2015-09-19 ENCOUNTER — Encounter (INDEPENDENT_AMBULATORY_CARE_PROVIDER_SITE_OTHER): Payer: Self-pay | Admitting: Ophthalmology

## 2015-09-19 DIAGNOSIS — E039 Hypothyroidism, unspecified: Secondary | ICD-10-CM | POA: Insufficient documentation

## 2015-09-19 DIAGNOSIS — H5213 Myopia, bilateral: Secondary | ICD-10-CM | POA: Insufficient documentation

## 2015-09-19 DIAGNOSIS — H401333 Pigmentary glaucoma, bilateral, severe stage: Secondary | ICD-10-CM | POA: Insufficient documentation

## 2015-09-19 DIAGNOSIS — H269 Unspecified cataract: Secondary | ICD-10-CM | POA: Insufficient documentation

## 2015-09-19 NOTE — Progress Notes (Signed)
[facility]  [facility]ated by [facility]  [address]  [place] [id number]  Dept: [phone]       Patient Name: Kaitlyn PiqueDebra Dean  MRN#: [id number]  Birthdate: [birth year]    Date of Service: 09/18/2015    Chief Complaint     Glaucoma          Kaitlyn Dean is a 55 y.o. female who presents today for evaluation/consultation of:  HPI     Glaucoma   In both eyes.  Duration of months.           Comments   Patient referred by Kaitlyn Dean for elevated IOP.    Patient was noted to have IOP greater than 30 with c/d ratios of 0.7 OU on 02/22/13 and 04/08/15.  Patient was referred to Kaitlyn Dean in 2015 and to me most recently.  Patient was to see me in August, but she canceled due to illness.         Last edited by Kaitlyn Maineharlton, Kaitlyn Stenzel, MD on 09/19/2015  4:43 PM. (History)        ROS     Positive for: Gastrointestinal, HENT, Endocrine, Eyes, Psychiatric, Allergic/Imm    Negative for: Constitutional, Neurological, Skin, Genitourinary, Musculoskeletal, Cardiovascular, Respiratory, Heme/Lymph    Last edited by Kaitlyn Maineharlton, Kaitlyn Morga, MD on 09/19/2015  4:43 PM. (History)          Past Surgical History:   Procedure Laterality Date    HX LAP CHOLECYSTECTOMY      HX SINUS SURGERY              has a past medical history of GERD (gastroesophageal reflux disease); Glaucoma; and Thyroid disease.    Patient Active Problem List   Diagnosis    Pigmentary glaucoma of both eyes, severe stage    Myopia of both eyes    Cataract of both eyes    Hypothyroid       Family History:  Family History   Problem Relation Age of Onset    Cataract Father     Cataract Sister     Cancer Son     Glaucoma Daughter     Stroke Daughter            Social History:     Social History   Substance Use Topics    Smoking status: Former Smoker     Packs/day: 3.00     Years: 10.00    Smokeless tobacco: Not on file    Alcohol use No              Assessment:    1. Pigmentary glaucoma of both eyes, severe stage    2. Myopia of both eyes    3. Cataract of both  eyes    4. Hypothyroidism, unspecified type        Ophthalmic Plan of Care:    Start Cosopt OU BID  ON photos taken today   Pachymetry today (thin CCT)  RTC in 4 weeks for VF, repeat IOP check on Cosopt    Follow up:    I have asked Kaitlyn Dean to follow up in 4 weeks         I have seen and examined the above patient. I discussed the above diagnoses listed in the assessment and the above ophthalmic plan of care with the patient and patient's family. All questions were answered. I reviewed and, when necessary, made changes to the technician/resident note, documented ophthalmology exam, chief complaint, history of present illness, allergies, review  of systems, past medical, past surgical, family and social history. I personally reviewed and interpreted all testing and/or imaging performed at this visit and agree with the resident's or fellow's interpretation. Any exceptions/additions are edited/noted in the relevant encounter fields.    Kaitlyn Maine, MD 09/19/2015, 16:54

## 2015-09-27 ENCOUNTER — Encounter (HOSPITAL_COMMUNITY): Payer: Self-pay | Admitting: Emergency Medicine

## 2015-09-27 ENCOUNTER — Emergency Department (HOSPITAL_COMMUNITY)
Admission: EM | Admit: 2015-09-27 | Discharge: 2015-09-28 | Disposition: A | Discharge status: Home / Self Care | Payer: BLUE CROSS/BLUE SHIELD | Attending: Emergency Medicine | Admitting: Emergency Medicine

## 2015-09-27 DIAGNOSIS — M7989 Other specified soft tissue disorders: Secondary | ICD-10-CM | POA: Diagnosis not present

## 2015-09-27 DIAGNOSIS — M25562 Pain in left knee: Secondary | ICD-10-CM | POA: Diagnosis not present

## 2015-09-27 DIAGNOSIS — Z79899 Other long term (current) drug therapy: Secondary | ICD-10-CM | POA: Insufficient documentation

## 2015-09-27 DIAGNOSIS — J45909 Unspecified asthma, uncomplicated: Secondary | ICD-10-CM | POA: Insufficient documentation

## 2015-09-27 NOTE — ED Notes (Signed)
Bed: WA14 Expected date:  Expected time:  Means of arrival:  Comments: Knee pain 

## 2015-09-27 NOTE — ED Provider Notes (Signed)
[facility] DEPT Provider Note   CSN: [id number] Arrival date & time: 09/27/15  2335  By signing my name below, I, Jeanell Sparrow, attest that this documentation has been prepared under the direction and in the presence of non-physician practitioner, Shary Decamp, PA-C. Electronically Signed: Jeanell Sparrow, Scribe. 09/27/2015. 11:42 PM.  History   Chief Complaint Chief Complaint  Patient presents with  . Knee Pain   The history is provided by the patient. No language interpreter was used.   HPI Comments: Cathy Baxter is a 55 y.o. female who presents to the Emergency Department complaining of constant moderate left knee pain that started more than 3 weeks ago. Pt states she had an arthroscopic knee surgery done by Dr. Ninfa Linden for a "tear" on 09/04/15. Follow-up appointment is in 5 days. Pt describes the pain as worsening, non-radiating, 8/10 in severity, exacerbated by movement, and relieved by ice and massage. Knee has associated swelling. Denies any known trauma, hx of DVT, knee redness, fever, chest pain, or SOB. No other symptoms noted.    Past Medical History:  Diagnosis Date  . Asthma   . Thyroid disease     Patient Active Problem List   Diagnosis Date Noted  . Abdominal pain, chronic, right lower quadrant 05/09/2015    Past Surgical History:  Procedure Laterality Date  . CHOLECYSTECTOMY    . KNEE SURGERY Left 08/2015    OB History    No data available       Home Medications    Prior to Admission medications   Medication Sig Start Date End Date Taking? Authorizing Provider  cetirizine (ZYRTEC) 10 MG tablet Take 10 mg by mouth daily.    Historical Provider, MD  dicyclomine (BENTYL) 20 MG tablet Take 20 mg by mouth every 6 (six) hours.    Historical Provider, MD  fluticasone (FLONASE) 50 MCG/ACT nasal spray Place 2 sprays into both nostrils daily. 05/09/15   Brayton Caves, PA-C  HYDROcodone-acetaminophen (NORCO/VICODIN) 5-325 MG tablet Take 1-2 tablets by mouth  every 6 (six) hours as needed for moderate pain. 07/13/15   Blanchie Dessert, MD  levothyroxine (SYNTHROID, LEVOTHROID) 50 MCG tablet Take 50 mcg by mouth daily before breakfast.    Historical Provider, MD  mometasone-formoterol (DULERA) 100-5 MCG/ACT AERO Inhale 2 puffs into the lungs 2 (two) times daily.    Historical Provider, MD  omeprazole (PRILOSEC) 40 MG capsule Take 1 capsule (40 mg total) by mouth daily. 05/09/15   Tiffany Daneil Dan, PA-C  pantoprazole (PROTONIX) 40 MG tablet Take 40 mg by mouth daily. Reported on 05/09/2015    Historical Provider, MD   Family History No family history on file.  Social History Social History  Substance Use Topics  . Smoking status: Never Smoker  . Smokeless tobacco: Never Used  . Alcohol use 1.2 - 1.8 oz/week    2 - 3 Cans of beer per week     Comment: 2 -3 days     Allergies   Percocet [oxycodone-acetaminophen]; Amoxicillin; and Demerol [meperidine]   Review of Systems Review of Systems A complete 10 system review of systems was obtained and all systems are negative except as noted in the HPI and PMH.   Physical Exam Updated Vital Signs BP 92/64 (BP Location: Left Arm)   Pulse 104   Temp 98.5 F (36.9 C) (Oral)   Resp 20   SpO2 100%   Physical Exam  Constitutional: She is oriented to person, place, and time. She appears well-developed and well-nourished. No  distress.  HENT:  Head: Normocephalic and atraumatic.  Eyes: Conjunctivae are normal.  Neck: Neck supple.  Cardiovascular: Normal rate and regular rhythm.   Pulmonary/Chest: Effort normal.  Abdominal: Soft.  Musculoskeletal:       Left knee: She exhibits decreased range of motion and swelling. She exhibits no deformity, no laceration and no erythema.  Left Knee with circumferential swelling noted. No erythema. NVI. Motor/sensation intact.    Neurological: She is alert and oriented to person, place, and time.  Skin: Skin is warm and dry.  Psychiatric: She has a normal mood and  affect.  Nursing note and vitals reviewed.  ED Treatments / Results  DIAGNOSTIC STUDIES: Oxygen Saturation is 100% on RA, normal by my interpretation.    COORDINATION OF CARE: 11:48 PM- Pt advised of plan for treatment and pt agrees.  Labs (all labs ordered are listed, but only abnormal results are displayed) Labs Reviewed - No data to display  EKG  EKG Interpretation None      Radiology No results found.  Procedures Procedures (including critical care time)  Medications Ordered in ED Medications - No data to display   Initial Impression / Assessment and Plan / ED Course  I have reviewed the triage vital signs and the nursing notes.  Pertinent labs & imaging results that were available during my care of the patient were reviewed by me and considered in my medical decision making (see chart for details).  Clinical Course   Final Clinical Impressions(s) / ED Diagnoses  I have reviewed and evaluated the relevant laboratory values I have reviewed and evaluated the relevant imaging studies.  I have reviewed the relevant previous healthcare records. I have reviewed EMS Documentation. I obtained HPI from historian. Patient discussed with supervising physician  ED Course:  Assessment: Pt is a 55yF with hx Left Knee Arthroscopy on 8-24 by Dr. Ninfa Linden who presents with left knee swelling. Noted swelling increase from surgery. No fevers at home. On exam, pt in NAD. Nontoxic/nonseptic appearing. VSS. Afebrile. Lungs CTA. Heart RRR. Left knee with no erythema. NVI. Motor/sensation intact. Pt had knee arthroscopy done in August due to torn meniscus. CBC unremarkable. BMP unremarkable. XR withotu acute abnormalities. DVT U/S ordered for AM due BLE pain along thighs. Discussed with supervising physician. Will order DVT work up for AM. Plan is to [place] with follow up to Orthopedics. I do not suspect septic joint based on labs and examination. Likely post op inflammation due to  arthroscopic surgical repair of meniscal damage. Given LovenoxAt time of discharge, Patient is in no acute distress. Vital Signs are stable. Patient is able to ambulate. Patient able to tolerate PO.    Disposition/Plan:  DC Home Additional Verbal discharge instructions given and discussed with patient.  Pt Instructed to f/u with Orthopedics in the next week for evaluation and treatment of symptoms. Return precautions given Pt acknowledges and agrees with plan  Supervising Physician April Palumbo, MD   Final diagnoses:  Left knee pain    New Prescriptions New Prescriptions   No medications on file    I personally performed the services described in this documentation, which was scribed in my presence. The recorded information has been reviewed and is accurate.     Shary Decamp, PA-C 09/28/15 0221    Veatrice Kells, MD 09/28/15 [facility] [place]

## 2015-09-27 NOTE — ED Triage Notes (Signed)
Pt transported from home by EMS with c/o L knee pain. Pt reports recent surgery 8/24, worsening pain last 2 week. EMS reports edema can be seen around knee.

## 2015-09-28 ENCOUNTER — Ambulatory Visit (HOSPITAL_COMMUNITY): Payer: BLUE CROSS/BLUE SHIELD

## 2015-09-28 ENCOUNTER — Emergency Department (HOSPITAL_COMMUNITY): Payer: BLUE CROSS/BLUE SHIELD

## 2015-09-28 LAB — CBC
HCT: 39.9 % (ref 36.0–46.0)
Hemoglobin: 13.2 g/dL (ref 12.0–15.0)
MCH: 33.4 pg (ref 26.0–34.0)
MCHC: 33.1 g/dL (ref 30.0–36.0)
MCV: 101 fL — ABNORMAL HIGH (ref 78.0–100.0)
Platelets: 438 10*3/uL — ABNORMAL HIGH (ref 150–400)
RBC: 3.95 MIL/uL (ref 3.87–5.11)
RDW: 12.4 % (ref 11.5–15.5)
WBC: 9.6 10*3/uL (ref 4.0–10.5)

## 2015-09-28 LAB — BASIC METABOLIC PANEL
Anion gap: 8 (ref 5–15)
BUN: 19 mg/dL (ref 6–20)
CO2: 29 mmol/L (ref 22–32)
Calcium: 9.7 mg/dL (ref 8.9–10.3)
Chloride: 104 mmol/L (ref 101–111)
Creatinine, Ser: 0.86 mg/dL (ref 0.44–1.00)
GFR calc Af Amer: >60 mL/min (ref >60)
GFR calc non Af Amer: >60 mL/min (ref >60)
Glucose, Bld: 97 mg/dL (ref 65–99)
Potassium: 4 mmol/L (ref 3.5–5.1)
Sodium: 141 mmol/L (ref 135–145)

## 2015-09-28 MED ORDER — ENOXAPARIN SODIUM 80 MG/0.8ML ~~LOC~~ SOLN
75.0000 mg | Freq: Once | SUBCUTANEOUS | Status: AC
  Administered 2015-09-28: 75 mg via SUBCUTANEOUS
  Filled 2015-09-28: qty 0.8

## 2015-09-28 NOTE — Discharge Instructions (Signed)
Please read and follow all provided instructions.  Your diagnoses today include:  1. Left knee pain     Tests performed today include: Vital signs. See below for your results today.   Medications prescribed:  Take as prescribed   Home care instructions:  Follow any educational materials contained in this packet.  Follow-up instructions: Please follow-up with your orthopedic provider for further evaluation of symptoms and treatment   IMPORTANT PATIENT INSTRUCTIONS:  You have been scheduled for an Outpatient Vascular Study at [facility].    If tomorrow is a Saturday or Sunday, please go to the [facility] Emergency Department Registration Desk at 8 am tomorrow morning and tell them you are there for a vascular study.  If tomorrow is a weekday (Monday-Friday), please go to [facility] Admitting Department at 8 am and tell them you are  there for a vascular study.  Return instructions:  Please return to the Emergency Department if you do not get better, if you get worse, or new symptoms OR  - Fever (temperature greater than 101.64F)  - Bleeding that does not stop with holding pressure to the area    -Severe pain (please note that you may be more sore the day after your accident)  - Chest Pain  - Difficulty breathing  - Severe nausea or vomiting  - Inability to tolerate food and liquids  - Passing out  - Skin becoming red around your wounds  - Change in mental status (confusion or lethargy)  - New numbness or weakness    Please return if you have any other emergent concerns.  Additional Information:  Your vital signs today were: BP 92/64 (BP Location: Left Arm)    Pulse 104    Temp 98.5 F (36.9 C) (Oral)    Resp 20    Ht 5\' 1"  (1.549 m)    Wt 74.8 kg    SpO2 100%    BMI 31.18 kg/m  If your blood pressure (BP) was elevated above 135/85 this visit, please have this repeated by your doctor within one month. ---------------

## 2015-09-29 ENCOUNTER — Ambulatory Visit (HOSPITAL_COMMUNITY)
Admission: RE | Admit: 2015-09-29 | Discharge: 2015-09-29 | Disposition: A | Discharge status: Home / Self Care | Payer: BLUE CROSS/BLUE SHIELD | Source: Ambulatory Visit | Attending: Physician Assistant | Admitting: Physician Assistant

## 2015-09-29 DIAGNOSIS — M79661 Pain in right lower leg: Secondary | ICD-10-CM | POA: Insufficient documentation

## 2015-09-29 DIAGNOSIS — M79609 Pain in unspecified limb: Secondary | ICD-10-CM

## 2015-09-29 DIAGNOSIS — M79662 Pain in left lower leg: Secondary | ICD-10-CM | POA: Diagnosis not present

## 2015-09-29 DIAGNOSIS — M7989 Other specified soft tissue disorders: Secondary | ICD-10-CM | POA: Diagnosis not present

## 2015-09-29 NOTE — Progress Notes (Signed)
VASCULAR LAB PRELIMINARY  PRELIMINARY  PRELIMINARY  PRELIMINARY  Bilateral lower extremity venous duplex completed.    Preliminary report:  Bilateral:  No evidence of DVT, superficial thrombosis, or Baker's Cyst.   Nachman Sundt, RVS 09/29/2015, 12:07 PM

## 2015-10-17 ENCOUNTER — Ambulatory Visit (INDEPENDENT_AMBULATORY_CARE_PROVIDER_SITE_OTHER): Payer: BLUE CROSS/BLUE SHIELD | Admitting: Internal Medicine

## 2015-10-17 ENCOUNTER — Encounter: Payer: Self-pay | Admitting: Internal Medicine

## 2015-10-17 VITALS — BP 120/82 | HR 81 | Ht 62.0 in | Wt 188.0 lb

## 2015-10-17 DIAGNOSIS — E039 Hypothyroidism, unspecified: Secondary | ICD-10-CM | POA: Insufficient documentation

## 2015-10-17 DIAGNOSIS — R7989 Other specified abnormal findings of blood chemistry: Secondary | ICD-10-CM

## 2015-10-17 DIAGNOSIS — R946 Abnormal results of thyroid function studies: Secondary | ICD-10-CM | POA: Diagnosis not present

## 2015-10-17 LAB — T4, FREE: Free T4: 0.9 ng/dL (ref 0.60–1.60)

## 2015-10-17 LAB — TSH: TSH: 2.5 u[IU]/mL (ref 0.35–4.50)

## 2015-10-17 LAB — T3, FREE: T3, Free: 3.1 pg/mL (ref 2.3–4.2)

## 2015-10-17 MED ORDER — LEVOTHYROXINE SODIUM 25 MCG PO TABS: 25.0000 ug | ORAL_TABLET | Freq: Every day | ORAL | 1 refills | Status: DC

## 2015-10-17 NOTE — Progress Notes (Addendum)
Patient ID: Cathy Baxter, female   DOB: 07-Nov-1960, 55 y.o.   MRN: [id number]   HPI  Cathy Baxter is a 55 y.o.-year-old female, referred by her gastroenterologist, Dr. Collene Mares, for evaluation for uncontrolled hypothyroidism.  She was dx'ed with hypothyroidism 3 years ago. She was started on LT4 50 mcg and decreased to LT4 25 mcg daily in 08/2015.  I reviewed pt's thyroid tests: 09/09/2015: TSH 0.25 (0.4-4.3) Reportedly normal thyroid tests before, by checks   She takes LT4: - am, fasting - with water - eats >30 min later - Protonix in the evening   Pt describes: - + weight gain - + fatigue - + cold intolerance - mo tremors - no palpitations - no depression, no anxiety - + constipation now but was having frequent stools before changing the LT4 dose  - no dry skin - no hair loss  Pt denies feeling nodules in neck, hoarseness, dysphagia/odynophagia, SOB with lying down.  She has + FH of thyroid disorders in: sister, daughter (who also has celiac ds). No FH of thyroid cancer.  No h/o radiation tx to head or neck. No recent use of iodine supplements.  ROS: Constitutional: + see HPI, + excessive urination Eyes: + blurry vision, no xerophthalmia ENT: no sore throat, + see HPI Cardiovascular: no CP/SOB/palpitations/leg swelling Respiratory: no cough/SOB Gastrointestinal: no N/V/D/+ C Musculoskeletal: no muscle/joint aches Skin: no rashes Neurological: no tremors/numbness/tingling/dizziness Psychiatric: no depression/anxiety  Past Medical History:  Diagnosis Date  . Asthma   . Thyroid disease    Past Surgical History:  Procedure Laterality Date  . CHOLECYSTECTOMY    . KNEE SURGERY Left 08/2015   Social History   Social History  . Marital status: Divorced    Spouse name: N/A  . Number of children: 3   Occupational History  . sales   Social History Main Topics  . Smoking status: former Smoker, quit in the 80s  . Smokeless tobacco: Never Used  . Alcohol use 1.2  - 1.8 oz/week    2 - 3 Cans of beer per week     Comment: 2 -3 days   . Drug use: No   Current Outpatient Prescriptions on File Prior to Visit  Medication Sig Dispense Refill  . cetirizine (ZYRTEC) 10 MG tablet Take 10 mg by mouth daily as needed for allergies.     Marland Kitchen dorzolamide-timolol (COSOPT) 22.3-6.8 MG/ML ophthalmic solution Place 1 drop into both eyes 2 (two) times daily.     . fluticasone (FLONASE) 50 MCG/ACT nasal spray Place 2 sprays into both nostrils daily. (Patient taking differently: Place 2 sprays into both nostrils daily as needed (for nasal congestion). ) 16 g 6  . levothyroxine (SYNTHROID, LEVOTHROID) 25 MCG tablet Take 25 mcg by mouth daily before breakfast.     . mometasone-formoterol (DULERA) 100-5 MCG/ACT AERO Inhale 2 puffs into the lungs 2 (two) times daily as needed (for exacerbation of asthma symtoms).     . pantoprazole (PROTONIX) 40 MG tablet Take 40 mg by mouth 2 (two) times daily. Reported on 05/09/2015    . diclofenac (VOLTAREN) 75 MG EC tablet Take 75 mg by mouth 2 (two) times daily as needed (for pain.).     Marland Kitchen HYDROcodone-acetaminophen (NORCO) 7.5-325 MG tablet Take 1 tablet by mouth every 6 (six) hours as needed (for pain.).     Marland Kitchen HYDROcodone-acetaminophen (NORCO/VICODIN) 5-325 MG tablet Take 1-2 tablets by mouth every 6 (six) hours as needed for moderate pain. (Patient not taking: Reported on 10/17/2015) 10  tablet 0  . methocarbamol (ROBAXIN) 500 MG tablet Take 500 mg by mouth every 6 (six) hours as needed for muscle spasms.     Marland Kitchen omeprazole (PRILOSEC) 40 MG capsule Take 1 capsule (40 mg total) by mouth daily. (Patient not taking: Reported on 10/17/2015) 30 capsule 3   No current facility-administered medications on file prior to visit.    Allergies  Allergen Reactions  . Percocet [Oxycodone-Acetaminophen] Shortness Of Breath  . Amoxicillin Hives  . Demerol [Meperidine] Palpitations   No family history on file.  PE: BP 120/82 (BP Location: Left Arm,  Patient Position: Sitting)   Pulse 81   Ht 5\' 2"  (1.575 m)   Wt 188 lb (85.3 kg)   SpO2 96%   BMI 34.39 kg/m  Wt Readings from Last 3 Encounters:  10/17/15 188 lb (85.3 kg)  09/27/15 165 lb (74.8 kg)  05/09/15 165 lb (74.8 kg)   Constitutional: overweight, in NAD Eyes: PERRLA, EOMI, no exophthalmos ENT: moist mucous membranes, no thyromegaly, no cervical lymphadenopathy Cardiovascular: RRR, No MRG Respiratory: CTA B Gastrointestinal: abdomen soft, NT, ND, BS+ Musculoskeletal: no deformities, strength intact in all 4 Skin: moist, warm, no rashes Neurological: no tremor with outstretched hands, DTR normal in all 4  ASSESSMENT: 1. Hypothyroidism  PLAN:  1. Patient with 3 years h/o hypothyroidism, on levothyroxine therapy. She had hyperthyroid sxs: hyperdefecation, fatigue, weight loss this summer >> referred to GI >> A TSH checked by Dr. Collene Mares was found to be suppressed >> Pt was advised to decrease her LT4 dose. Sxs improved after this >> now constipated, gained weight, irritability. - she does not appear to have a goiter, thyroid nodules, or neck compression symptoms - She continues LT4 25 mcg now - We discussed about correct intake of levothyroxine, fasting, with water, separated by at least 30 minutes from breakfast, and separated by more than 4 hours from calcium, iron, multivitamins, acid reflux medications (PPIs). She is taking it correctly, but sometimes later in the day >> advised to put tablets on the nightstand to take 1st thing in am. - will check thyroid tests today: TSH, free T4, free T3, TPO and ATA Abs - If labs today are abnormal, we will need to change the LT4 dose and she will need to return in 6 weeks for repeat labs - Otherwise, I will see her back in 4 months  Office Visit on 10/17/2015  Component Date Value Ref Range Status  . TSH 10/17/2015 2.50  0.35 - 4.50 uIU/mL Final  . Free T4 10/17/2015 0.90  0.60 - 1.60 ng/dL Final   Comment: Specimens from patients  who are undergoing biotin therapy and /or ingesting biotin supplements may contain high levels of biotin.  The higher biotin concentration in these specimens interferes with this Free T4 assay.  Specimens that contain high levels  of biotin may cause false high results for this Free T4 assay.  Please interpret results in light of the total clinical presentation of the patient.    . T3, Free 10/17/2015 3.1  2.3 - 4.2 pg/mL Final  . Thyroglobulin Ab 10/18/2015 <1  <2 IU/mL Final  . Thyroperoxidase Ab SerPl-aCnc 10/18/2015 <1  <9 IU/mL Final   Thyroid Ab's negative. TFTs are normal >> will refill her LT4 25 mcg   Philemon Kingdom, MD PhD [facility] Endocrinology

## 2015-10-17 NOTE — Patient Instructions (Signed)
Please continue Levothyroxine 25 mcg daily.  Take the thyroid hormone every day, with water, at least 30 minutes before breakfast, separated by at least 4 hours from: - acid reflux medications - calcium - iron - multivitamins  Please come back in 4 months.

## 2015-10-18 LAB — THYROID PEROXIDASE ANTIBODY: Thyroperoxidase Ab SerPl-aCnc: <1 IU/mL (ref <9)

## 2015-10-18 LAB — THYROGLOBULIN ANTIBODY: Thyroglobulin Ab: <1 IU/mL (ref <2)

## 2015-10-21 ENCOUNTER — Telehealth: Payer: Self-pay

## 2015-10-21 NOTE — Telephone Encounter (Signed)
Called patient and gave lab results. Patient had no questions or concerns.  

## 2015-10-23 ENCOUNTER — Ambulatory Visit (INDEPENDENT_AMBULATORY_CARE_PROVIDER_SITE_OTHER): Payer: BLUE CROSS/BLUE SHIELD | Admitting: Orthopaedic Surgery

## 2015-10-23 ENCOUNTER — Encounter (INDEPENDENT_AMBULATORY_CARE_PROVIDER_SITE_OTHER): Payer: BC Managed Care – PPO | Admitting: Ophthalmology

## 2015-10-23 DIAGNOSIS — M25562 Pain in left knee: Secondary | ICD-10-CM

## 2015-11-06 DIAGNOSIS — D259 Leiomyoma of uterus, unspecified: Secondary | ICD-10-CM | POA: Insufficient documentation

## 2015-11-06 DIAGNOSIS — E785 Hyperlipidemia, unspecified: Secondary | ICD-10-CM | POA: Insufficient documentation

## 2015-11-06 DIAGNOSIS — F1010 Alcohol abuse, uncomplicated: Secondary | ICD-10-CM | POA: Insufficient documentation

## 2015-11-06 DIAGNOSIS — R748 Abnormal levels of other serum enzymes: Secondary | ICD-10-CM | POA: Insufficient documentation

## 2015-11-06 DIAGNOSIS — O3410 Maternal care for benign tumor of corpus uteri, unspecified trimester: Secondary | ICD-10-CM

## 2015-11-06 DIAGNOSIS — K828 Other specified diseases of gallbladder: Secondary | ICD-10-CM | POA: Insufficient documentation

## 2015-11-06 DIAGNOSIS — J45909 Unspecified asthma, uncomplicated: Secondary | ICD-10-CM | POA: Insufficient documentation

## 2015-11-06 DIAGNOSIS — K449 Diaphragmatic hernia without obstruction or gangrene: Secondary | ICD-10-CM | POA: Insufficient documentation

## 2015-11-06 HISTORY — DX: Leiomyoma of uterus, unspecified: D25.9

## 2015-11-06 HISTORY — DX: Maternal care for benign tumor of corpus uteri, unspecified trimester: O34.10

## 2015-11-06 NOTE — Progress Notes (Deleted)
*  IMAGE* Office Visit Note  Patient: Cathy Baxter             Date of Birth: 1960-05-05           MRN: [id number]             PCP: Linda Hedges D.O. Referring: Juanita Craver M.D. Visit Date: 11/07/2015    Subjective:  No chief complaint on file.   History of Present Illness: Cathy Baxter is a 55 y.o. female ***   Activities of Daily Living:  Patient reports morning stiffness for *** {minute/hour:19697}.   Patient {ACTIONS;DENIES/REPORTS:21021675::"Denies"} nocturnal pain.  Difficulty dressing/grooming: {ACTIONS;DENIES/REPORTS:21021675::"Denies"} Difficulty climbing stairs: {ACTIONS;DENIES/REPORTS:21021675::"Denies"} Difficulty getting out of chair: {ACTIONS;DENIES/REPORTS:21021675::"Denies"} Difficulty using hands for taps, buttons, cutlery, and/or writing: {ACTIONS;DENIES/REPORTS:21021675::"Denies"}   No Rheumatology ROS completed.   PMFS History:  Patient Active Problem List   Diagnosis Date Noted  . Hypothyroidism 10/17/2015  . Abdominal pain, chronic, right lower quadrant 05/09/2015    Past Medical History:  Diagnosis Date  . Asthma   . Thyroid disease     No family history on file. Past Surgical History:  Procedure Laterality Date  . CHOLECYSTECTOMY    . KNEE SURGERY Left 08/2015   Social History   Social History Narrative  . No narrative on file     Objective: Vital Signs: There were no vitals taken for this visit.   Physical Exam   Musculoskeletal Exam: ***  CDAI Exam: No CDAI exam completed.    Investigation: No additional findings.   Imaging: No results found.  Speciality Comments: No specialty comments available.    Procedures:  No procedures performed Allergies: Percocet [oxycodone-acetaminophen]; Amoxicillin; and Demerol [meperidine]   Assessment / Plan: Visit Diagnoses: No diagnosis found.   No problem-specific Assessment & Plan notes found for this encounter.   Follow-Up Instructions: No Follow-up on file.  Orders: No  orders of the defined types were placed in this encounter.  No orders of the defined types were placed in this encounter.

## 2015-11-07 ENCOUNTER — Ambulatory Visit: Payer: Self-pay | Admitting: Rheumatology

## 2015-11-20 ENCOUNTER — Ambulatory Visit (INDEPENDENT_AMBULATORY_CARE_PROVIDER_SITE_OTHER): Payer: BLUE CROSS/BLUE SHIELD | Admitting: Orthopaedic Surgery

## 2015-12-08 ENCOUNTER — Ambulatory Visit: Payer: Self-pay | Admitting: Rheumatology

## 2015-12-25 ENCOUNTER — Encounter (INDEPENDENT_AMBULATORY_CARE_PROVIDER_SITE_OTHER): Payer: Self-pay | Admitting: Ophthalmology

## 2016-01-08 ENCOUNTER — Encounter (HOSPITAL_BASED_OUTPATIENT_CLINIC_OR_DEPARTMENT_OTHER): Payer: Self-pay | Admitting: *Deleted

## 2016-01-08 ENCOUNTER — Emergency Department (HOSPITAL_BASED_OUTPATIENT_CLINIC_OR_DEPARTMENT_OTHER)
Admission: EM | Admit: 2016-01-08 | Discharge: 2016-01-08 | Disposition: A | Discharge status: Home / Self Care | Payer: BLUE CROSS/BLUE SHIELD | Attending: Emergency Medicine | Admitting: Emergency Medicine

## 2016-01-08 DIAGNOSIS — R109 Unspecified abdominal pain: Secondary | ICD-10-CM | POA: Diagnosis not present

## 2016-01-08 DIAGNOSIS — J45909 Unspecified asthma, uncomplicated: Secondary | ICD-10-CM | POA: Diagnosis not present

## 2016-01-08 DIAGNOSIS — R197 Diarrhea, unspecified: Secondary | ICD-10-CM | POA: Diagnosis not present

## 2016-01-08 DIAGNOSIS — Z79899 Other long term (current) drug therapy: Secondary | ICD-10-CM | POA: Insufficient documentation

## 2016-01-08 DIAGNOSIS — E039 Hypothyroidism, unspecified: Secondary | ICD-10-CM | POA: Insufficient documentation

## 2016-01-08 DIAGNOSIS — R112 Nausea with vomiting, unspecified: Secondary | ICD-10-CM | POA: Diagnosis not present

## 2016-01-08 LAB — URINALYSIS, ROUTINE W REFLEX MICROSCOPIC
Bilirubin Urine: NEGATIVE
Glucose, UA: NEGATIVE mg/dL
Hgb urine dipstick: NEGATIVE
Ketones, ur: NEGATIVE mg/dL
Leukocytes, UA: NEGATIVE
Nitrite: NEGATIVE
Protein, ur: NEGATIVE mg/dL
Specific Gravity, Urine: 1.015 (ref 1.005–1.030)
pH: 5 (ref 5.0–8.0)

## 2016-01-08 LAB — COMPREHENSIVE METABOLIC PANEL
ALT: 42 U/L (ref 14–54)
AST: 32 U/L (ref 15–41)
Albumin: 4 g/dL (ref 3.5–5.0)
Alkaline Phosphatase: 66 U/L (ref 38–126)
Anion gap: 7 (ref 5–15)
BUN: 16 mg/dL (ref 6–20)
CO2: 26 mmol/L (ref 22–32)
Calcium: 8.6 mg/dL — ABNORMAL LOW (ref 8.9–10.3)
Chloride: 106 mmol/L (ref 101–111)
Creatinine, Ser: 0.62 mg/dL (ref 0.44–1.00)
GFR calc Af Amer: >60 mL/min (ref >60)
GFR calc non Af Amer: >60 mL/min (ref >60)
Glucose, Bld: 94 mg/dL (ref 65–99)
Potassium: 3.5 mmol/L (ref 3.5–5.1)
Sodium: 139 mmol/L (ref 135–145)
Total Bilirubin: 0.2 mg/dL — ABNORMAL LOW (ref 0.3–1.2)
Total Protein: 7.4 g/dL (ref 6.5–8.1)

## 2016-01-08 LAB — CBC WITH DIFFERENTIAL/PLATELET
Basophils Absolute: 0 10*3/uL (ref 0.0–0.1)
Basophils Relative: 0 %
Eosinophils Absolute: 0.2 10*3/uL (ref 0.0–0.7)
Eosinophils Relative: 3 %
HCT: 39.8 % (ref 36.0–46.0)
Hemoglobin: 13 g/dL (ref 12.0–15.0)
Lymphocytes Relative: 20 %
Lymphs Abs: 1.6 10*3/uL (ref 0.7–4.0)
MCH: 33.2 pg (ref 26.0–34.0)
MCHC: 32.7 g/dL (ref 30.0–36.0)
MCV: 101.5 fL — ABNORMAL HIGH (ref 78.0–100.0)
Monocytes Absolute: 0.7 10*3/uL (ref 0.1–1.0)
Monocytes Relative: 9 %
Neutro Abs: 5.3 10*3/uL (ref 1.7–7.7)
Neutrophils Relative %: 68 %
Platelets: 342 10*3/uL (ref 150–400)
RBC: 3.92 MIL/uL (ref 3.87–5.11)
RDW: 11.3 % — ABNORMAL LOW (ref 11.5–15.5)
WBC: 7.9 10*3/uL (ref 4.0–10.5)

## 2016-01-08 LAB — LIPASE, BLOOD: Lipase: 20 U/L (ref 11–51)

## 2016-01-08 MED ORDER — LOPERAMIDE HCL 2 MG PO CAPS: 2.0000 mg | ORAL_CAPSULE | Freq: Four times a day (QID) | ORAL | 0 refills | Status: DC | PRN

## 2016-01-08 MED ORDER — DICYCLOMINE HCL 20 MG PO TABS: 20.0000 mg | ORAL_TABLET | Freq: Two times a day (BID) | ORAL | 0 refills | Status: DC

## 2016-01-08 MED ORDER — LOPERAMIDE HCL 2 MG PO CAPS
2.0000 mg | ORAL_CAPSULE | Freq: Once | ORAL | Status: AC
  Administered 2016-01-08: 2 mg via ORAL
  Filled 2016-01-08: qty 1

## 2016-01-08 MED ORDER — DICYCLOMINE HCL 10 MG PO CAPS
20.0000 mg | ORAL_CAPSULE | Freq: Once | ORAL | Status: AC
  Administered 2016-01-08: 20 mg via ORAL
  Filled 2016-01-08: qty 2

## 2016-01-08 MED FILL — SM ANTI-DIARRHEAL 2 MG CAPL: 2 | 12 days supply | Qty: 48 | Fill #0

## 2016-01-08 MED FILL — DICYCLOMINE 20 MG TABLET: 20 | 10 days supply | Qty: 20 | Fill #0

## 2016-01-08 NOTE — ED Triage Notes (Signed)
Vomiting and diarrhea 

## 2016-01-08 NOTE — Discharge Instructions (Signed)
Take the prescribed medication as directed.  Recommend gentle diet for now, progress back to normal as tolerated. Follow-up with GI if symptoms persist or do not seem to be improving over the next few days. Return to the ED for new or worsening symptoms.

## 2016-01-08 NOTE — ED Provider Notes (Signed)
[facility] Provider Note   CSN: [id number] Arrival date & time: 01/08/16  1224     History   Chief Complaint Chief Complaint  Patient presents with  . Emesis    HPI Cathy Baxter is a 55 y.o. female.  The history is provided by the patient and medical records.    55 year old female with history of asthma and thyroid disease, presenting to the ED for abdominal pain, nausea, vomiting, diarrhea. Patient reports she went to [place] over the Christmas holiday worse visiting her daughter was sick with a GI bug as well as her grandson. States initially she had some nausea and vomiting, but has had diarrhea for the past 3 days now. Vomiting seems to have resolved. States she has some intermittent pains in her mid abdomen. Sometimes sharp, sometimes a dull ache. He has not had any difficulty eating or drinking, but states food seems to "go right to her". States diarrhea has been watery and nonbloody. She denies any fever or chills. No travel out of the country or dietary changes. States she was told in the past that she may have IBS. States she saw a GI doctor at one point and was started on medication, however stopped taking it.  States she has been told she has gastritis in the past as well.  She has not tried any medication for her symptoms.  Hx of cholecystectomy.  Past Medical History:  Diagnosis Date  . Asthma   . Thyroid disease     Patient Active Problem List   Diagnosis Date Noted  . Elevated liver enzymes 11/06/2015  . Hiatal hernia 11/06/2015  . Biliary dyskinesia 11/06/2015  . Hyperlipidemia 11/06/2015  . Asthma 11/06/2015  . Alcohol abuse 11/06/2015  . Uterine fibroids in pregnancy, postpartum condition 11/06/2015  . Hypothyroidism 10/17/2015  . Abdominal pain, chronic, right lower quadrant 05/09/2015    Past Surgical History:  Procedure Laterality Date  . CHOLECYSTECTOMY    . KNEE SURGERY Left 08/2015    OB History    No data available        Home Medications    Prior to Admission medications   Medication Sig Start Date End Date Taking? Authorizing Provider  cetirizine (ZYRTEC) 10 MG tablet Take 10 mg by mouth daily as needed for allergies.    Yes Historical Provider, MD  levothyroxine (SYNTHROID, LEVOTHROID) 25 MCG tablet Take 1 tablet (25 mcg total) by mouth daily before breakfast. 10/17/15  Yes Philemon Kingdom, MD  mometasone-formoterol (DULERA) 100-5 MCG/ACT AERO Inhale 2 puffs into the lungs 2 (two) times daily as needed (for exacerbation of asthma symtoms).    Yes Historical Provider, MD  diclofenac (VOLTAREN) 75 MG EC tablet Take 75 mg by mouth 2 (two) times daily as needed (for pain.).  09/12/15   Historical Provider, MD  dorzolamide-timolol (COSOPT) 22.3-6.8 MG/ML ophthalmic solution Place 1 drop into both eyes 2 (two) times daily.  09/18/15   Historical Provider, MD  fluticasone (FLONASE) 50 MCG/ACT nasal spray Place 2 sprays into both nostrils daily. Patient taking differently: Place 2 sprays into both nostrils daily as needed (for nasal congestion).  05/09/15   Tiffany Daneil Dan, PA-C  HYDROcodone-acetaminophen (NORCO) 7.5-325 MG tablet Take 1 tablet by mouth every 6 (six) hours as needed (for pain.).  09/04/15   Historical Provider, MD  HYDROcodone-acetaminophen (NORCO/VICODIN) 5-325 MG tablet Take 1-2 tablets by mouth every 6 (six) hours as needed for moderate pain. Patient not taking: Reported on 10/17/2015 07/13/15   Blanchie Dessert,  MD  methocarbamol (ROBAXIN) 500 MG tablet Take 500 mg by mouth every 6 (six) hours as needed for muscle spasms.  09/04/15   Historical Provider, MD  omeprazole (PRILOSEC) 40 MG capsule Take 1 capsule (40 mg total) by mouth daily. Patient not taking: Reported on 10/17/2015 05/09/15   Brayton Caves, PA-C  pantoprazole (PROTONIX) 40 MG tablet Take 40 mg by mouth 2 (two) times daily. Reported on 05/09/2015    Historical Provider, MD    Family History No family history on file.  Social  History Social History  Substance Use Topics  . Smoking status: Never Smoker  . Smokeless tobacco: Never Used  . Alcohol use 1.2 - 1.8 oz/week    2 - 3 Cans of beer per week     Comment: 2 -3 days      Allergies   Percocet [oxycodone-acetaminophen]; Amoxicillin; and Demerol [meperidine]   Review of Systems Review of Systems  Gastrointestinal: Positive for abdominal pain, diarrhea, nausea and vomiting.  All other systems reviewed and are negative.    Physical Exam Updated Vital Signs BP 124/77   Pulse 78   Temp 98.4 F (36.9 C) (Oral)   Resp 16   Ht 5' 1.5" (1.562 m)   Wt 85.3 kg   SpO2 99%   BMI 34.95 kg/m   Physical Exam  Constitutional: She is oriented to person, place, and time. Vital signs are normal. She appears well-developed and well-nourished.  Water bottle and bag of peanut M&M's at the bedside  HENT:  Head: Normocephalic and atraumatic.  Mouth/Throat: Oropharynx is clear and moist.  Eyes: Conjunctivae and EOM are normal. Pupils are equal, round, and reactive to light.  Neck: Normal range of motion.  Cardiovascular: Normal rate, regular rhythm and normal heart sounds.   Pulmonary/Chest: Effort normal and breath sounds normal.  Abdominal: Soft. Bowel sounds are normal. There is no tenderness. There is no rigidity and no guarding.  Abdomen soft, no distention, no focal tenderness, no peritoneal signs  Musculoskeletal: Normal range of motion.  Neurological: She is alert and oriented to person, place, and time.  Skin: Skin is warm and dry.  Psychiatric: She has a normal mood and affect.  Nursing note and vitals reviewed.    ED Treatments / Results  Labs (all labs ordered are listed, but only abnormal results are displayed) Labs Reviewed  CBC WITH DIFFERENTIAL/PLATELET - Abnormal; Notable for the following:       Result Value   MCV 101.5 (*)    RDW 11.3 (*)    All other components within normal limits  COMPREHENSIVE METABOLIC PANEL - Abnormal;  Notable for the following:    Calcium 8.6 (*)    Total Bilirubin 0.2 (*)    All other components within normal limits  LIPASE, BLOOD  URINALYSIS, ROUTINE W REFLEX MICROSCOPIC    EKG  EKG Interpretation None       Radiology No results found.  Procedures Procedures (including critical care time)  Medications Ordered in ED Medications  dicyclomine (BENTYL) capsule 20 mg (20 mg Oral Given 01/08/16 1403)  loperamide (IMODIUM) capsule 2 mg (2 mg Oral Given 01/08/16 1403)     Initial Impression / Assessment and Plan / ED Course  I have reviewed the triage vital signs and the nursing notes.  Pertinent labs & imaging results that were available during my care of the patient were reviewed by me and considered in my medical decision making (see chart for details).  Clinical Course  55 year old female here with nausea, vomiting, and diarrhea. Daughter and grandson recently sick with similar symptoms. She is afebrile and nontoxic. Abdomen is soft and benign. Lab work is reassuring. Patient was treated here with oral medications with improvement of her symptoms. She has been eating candy and drinking fluids in the ED without difficulty. She's not had any active emesis or diarrhea here. Suspect. We'll plan to discharge home with symptomatic care. She has GI doctor that she will follow-up with if symptoms not improving in the next few days.  Discussed plan with patient, she acknowledged understanding and agreed with plan of care.  Return precautions given for new or worsening symptoms.  Final Clinical Impressions(s) / ED Diagnoses   Final diagnoses:  Nausea vomiting and diarrhea    New Prescriptions New Prescriptions   DICYCLOMINE (BENTYL) 20 MG TABLET    Take 1 tablet (20 mg total) by mouth 2 (two) times daily.   LOPERAMIDE (IMODIUM) 2 MG CAPSULE    Take 1 capsule (2 mg total) by mouth 4 (four) times daily as needed for diarrhea or loose stools.     Larene Pickett, PA-C 01/08/16  1614    Virgel Manifold, MD 01/09/16 1154

## 2016-02-17 ENCOUNTER — Ambulatory Visit: Payer: BLUE CROSS/BLUE SHIELD | Admitting: Internal Medicine

## 2016-02-20 ENCOUNTER — Encounter: Payer: Self-pay | Admitting: Family Medicine

## 2016-02-20 ENCOUNTER — Ambulatory Visit (INDEPENDENT_AMBULATORY_CARE_PROVIDER_SITE_OTHER): Payer: BLUE CROSS/BLUE SHIELD | Admitting: Family Medicine

## 2016-02-20 VITALS — BP 128/85 | HR 74 | Temp 98.2°F | Resp 20 | Ht 62.0 in | Wt 188.2 lb

## 2016-02-20 DIAGNOSIS — Z6834 Body mass index (BMI) 34.0-34.9, adult: Secondary | ICD-10-CM | POA: Diagnosis not present

## 2016-02-20 DIAGNOSIS — R635 Abnormal weight gain: Secondary | ICD-10-CM

## 2016-02-20 DIAGNOSIS — H401330 Pigmentary glaucoma, bilateral, stage unspecified: Secondary | ICD-10-CM

## 2016-02-20 DIAGNOSIS — J45909 Unspecified asthma, uncomplicated: Secondary | ICD-10-CM

## 2016-02-20 DIAGNOSIS — E039 Hypothyroidism, unspecified: Secondary | ICD-10-CM | POA: Diagnosis not present

## 2016-02-20 DIAGNOSIS — H269 Unspecified cataract: Secondary | ICD-10-CM | POA: Insufficient documentation

## 2016-02-20 DIAGNOSIS — Z7689 Persons encountering health services in other specified circumstances: Secondary | ICD-10-CM

## 2016-02-20 DIAGNOSIS — E669 Obesity, unspecified: Secondary | ICD-10-CM | POA: Insufficient documentation

## 2016-02-20 LAB — TSH: TSH: 1.09 u[IU]/mL (ref 0.35–4.50)

## 2016-02-20 NOTE — Progress Notes (Signed)
Patient ID: Cathy Baxter, female  DOB: [birth year], 56 y.o.   MRN: [id number] Patient Care Team    Relationship Specialty Notifications Start End  Ma Hillock, DO PCP - General Family Medicine  02/20/16     Subjective:  Cathy Baxter is a 56 y.o.  female present for new patient establishment. All past medical history, surgical history, allergies, family history, immunizations, medications and social history were obtained and entered in the electronic medical record today. All recent labs, ED visits and hospitalizations within the last year were reviewed.  She has multiple concerns today.   She complains to for weight gain, blurred vision and need of hypothyroid medication. She states on October she established with an endocrinologist for her thyroid and her dose was of synthroid was decreased form 50 mcg to 25 mcg daily. She reports compliance with medication and does take on empty stomach with water only. She states she has increase in unintentional weight gain and occasional constipation.   She has a h/o asthma that is worse with spring, and use dulera and flonase, zyrtec during at that time.   She reports R>L hand numbness at night and sometimes when grasping the bar on her exercise machine.    There is no immunization history on file for this patient.   Past Medical History:  Diagnosis Date  . Allergy   . Anemia   . Arthritis   . Asthma   . Chicken pox   . GERD (gastroesophageal reflux disease)   . Glaucoma   . Thyroid disease   . Urinary incontinence    Allergies  Allergen Reactions  . Percocet [Oxycodone-Acetaminophen] Shortness Of Breath  . Amoxicillin Hives    Has patient had a PCN reaction causing immediate rash, facial/tongue/throat swelling, SOB or lightheadedness with hypotension: Yes Has patient had a PCN reaction causing severe rash involving mucus membranes or skin necrosis: No Has patient had a PCN reaction that required hospitalization No Has  patient had a PCN reaction occurring within the last 10 years: Yes  If all of the above answers are "NO", then may proceed with Cephalosporin use.   . Demerol [Meperidine] Palpitations   Past Surgical History:  Procedure Laterality Date  . CHOLECYSTECTOMY    . KNEE SURGERY Left 08/2015  . NASAL SINUS SURGERY     Family History  Problem Relation Age of Onset  . Heart disease Mother   . Early death Father   . Cancer Son    Social History   Social History  . Marital status: Divorced    Spouse name: N/A  . Number of children: N/A  . Years of education: N/A   Occupational History  . Not on file.   Social History Main Topics  . Smoking status: Former Research scientist (life sciences)  . Smokeless tobacco: Never Used  . Alcohol use No  . Drug use: No  . Sexual activity: Yes   Other Topics Concern  . Not on file   Social History Narrative  . No narrative on file   Allergies as of 02/20/2016      Reactions   Percocet [oxycodone-acetaminophen] Shortness Of Breath   Amoxicillin Hives   Has patient had a PCN reaction causing immediate rash, facial/tongue/throat swelling, SOB or lightheadedness with hypotension: Yes Has patient had a PCN reaction causing severe rash involving mucus membranes or skin necrosis: No Has patient had a PCN reaction that required hospitalization No Has patient had a PCN reaction occurring within the last 10 years:  Yes  If all of the above answers are "NO", then may proceed with Cephalosporin use.   Demerol [meperidine] Palpitations      Medication List       Accurate as of 02/20/16  2:16 PM. Always use your most recent med list.          cetirizine 10 MG tablet Commonly known as:  ZYRTEC Take 10 mg by mouth daily as needed for allergies.   dicyclomine 20 MG tablet Commonly known as:  BENTYL Take 1 tablet (20 mg total) by mouth 2 (two) times daily.   dorzolamide-timolol 22.3-6.8 MG/ML ophthalmic solution Commonly known as:  COSOPT Place 1 drop into both eyes 2  (two) times daily.   DULERA 100-5 MCG/ACT Aero Generic drug:  mometasone-formoterol Inhale 2 puffs into the lungs 2 (two) times daily as needed (for exacerbation of asthma symtoms).   fluticasone 50 MCG/ACT nasal spray Commonly known as:  FLONASE Place 2 sprays into both nostrils daily.   levothyroxine 25 MCG tablet Commonly known as:  SYNTHROID, LEVOTHROID Take 1 tablet (25 mcg total) by mouth daily before breakfast.   loperamide 2 MG capsule Commonly known as:  IMODIUM Take 1 capsule (2 mg total) by mouth 4 (four) times daily as needed for diarrhea or loose stools.   pantoprazole 40 MG tablet Commonly known as:  PROTONIX Take 40 mg by mouth 2 (two) times daily. Reported on 05/09/2015   valACYclovir 1000 MG tablet Commonly known as:  VALTREX        Recent Results (from the past 2160 hour(s))  CBC with Differential     Status: Abnormal   Collection Time: 01/08/16  2:01 PM  Result Value Ref Range   WBC 7.9 4.0 - 10.5 K/uL   RBC 3.92 3.87 - 5.11 MIL/uL   Hemoglobin 13.0 12.0 - 15.0 g/dL   HCT 39.8 36.0 - 46.0 %   MCV 101.5 (H) 78.0 - 100.0 fL   MCH 33.2 26.0 - 34.0 pg   MCHC 32.7 30.0 - 36.0 g/dL   RDW 11.3 (L) 11.5 - 15.5 %   Platelets 342 150 - 400 K/uL   Neutrophils Relative % 68 %   Neutro Abs 5.3 1.7 - 7.7 K/uL   Lymphocytes Relative 20 %   Lymphs Abs 1.6 0.7 - 4.0 K/uL   Monocytes Relative 9 %   Monocytes Absolute 0.7 0.1 - 1.0 K/uL   Eosinophils Relative 3 %   Eosinophils Absolute 0.2 0.0 - 0.7 K/uL   Basophils Relative 0 %   Basophils Absolute 0.0 0.0 - 0.1 K/uL  Comprehensive metabolic panel     Status: Abnormal   Collection Time: 01/08/16  2:01 PM  Result Value Ref Range   Sodium 139 135 - 145 mmol/L   Potassium 3.5 3.5 - 5.1 mmol/L   Chloride 106 101 - 111 mmol/L   CO2 26 22 - 32 mmol/L   Glucose, Bld 94 65 - 99 mg/dL   BUN 16 6 - 20 mg/dL   Creatinine, Ser 0.62 0.44 - 1.00 mg/dL   Calcium 8.6 (L) 8.9 - 10.3 mg/dL   Total Protein 7.4 6.5 - 8.1  g/dL   Albumin 4.0 3.5 - 5.0 g/dL   AST 32 15 - 41 U/L   ALT 42 14 - 54 U/L   Alkaline Phosphatase 66 38 - 126 U/L   Total Bilirubin 0.2 (L) 0.3 - 1.2 mg/dL   GFR calc non Af Amer >60 >60 mL/min   GFR calc Af  Amer >60 >60 mL/min    Comment: (NOTE) The eGFR has been calculated using the CKD EPI equation. This calculation has not been validated in all clinical situations. eGFR's persistently <60 mL/min signify possible Chronic Kidney Disease.    Anion gap 7 5 - 15  Lipase, blood     Status: None   Collection Time: 01/08/16  2:01 PM  Result Value Ref Range   Lipase 20 11 - 51 U/L  Urinalysis, Routine w reflex microscopic     Status: None   Collection Time: 01/08/16  2:08 PM  Result Value Ref Range   Color, Urine YELLOW YELLOW   APPearance CLEAR CLEAR   Specific Gravity, Urine 1.015 1.005 - 1.030   pH 5.0 5.0 - 8.0   Glucose, UA NEGATIVE NEGATIVE mg/dL   Hgb urine dipstick NEGATIVE NEGATIVE   Bilirubin Urine NEGATIVE NEGATIVE   Ketones, ur NEGATIVE NEGATIVE mg/dL   Protein, ur NEGATIVE NEGATIVE mg/dL   Nitrite NEGATIVE NEGATIVE   Leukocytes, UA NEGATIVE NEGATIVE    Comment: Microscopic not done on urines with negative protein, blood, leukocytes, nitrite, or glucose < 500 mg/dL.    No results found.   ROS: 14 pt review of systems performed and negative (unless mentioned in an HPI)  Objective: BP 128/85 (BP Location: Right Arm, Patient Position: Sitting, Cuff Size: Large)   Pulse 74   Temp 98.2 F (36.8 C)   Resp 20   Ht _0  (1.575 m)   Wt 188 lb 4 oz (85.4 kg)   SpO2 98%   BMI 34.43 kg/m  Gen: Afebrile. No acute distress. Nontoxic in appearance, well-developed, well-nourished, obese.  HENT: AT. [place]. MMM Eyes:Pupils Equal Round Reactive to light, Extraocular movements intact,  Conjunctiva without redness, discharge or icterus. Neck/lymp/endocrine: Supple,no lymphadenopathy, no thyromegaly CV: RRR 1/6 SM, no edema Chest: CTAB, no wheeze, rhonchi or crackles.  Abd:  Soft. NTND. BS present.  Skin: no rashes, purpura or petechiae.  Neuro/Msk: Normal gait. PERLA. EOMi. Alert. Oriented x3.   Psych: Normal affect, dress and demeanor. Normal speech. Normal thought content and judgment.   Assessment/plan: Cathy Baxter is a 56 y.o. female present for establishment of care with multiple concerns.  BMI 34.0-34.9,adult/Weight gain - exercise > 150 minutes a week. Watch diet closely. Gave reference for calorie calculator to track calorie daily need.  - try myfittnesspal.  - TSH - Hemoglobin A1c - F/u as needed  Pigmentary glaucoma of both eyes, unspecified glaucoma stage - h/o glaucoma, myopia and cataracts.  - Ambulatory referral to Ophthalmology  Hypothyroidism, unspecified type - uncertain with pts story. She is established with endocrine and was checked in Oct for her thyroid. No h/o nodules.  - will repeat today since symptomatic and advise once lab results are available.   Asthma, unspecified asthma severity, unspecified whether complicated, unspecified whether persistent - currently controlled.  - only needs inhalers during spring (usually).   No records currently available. Yearly physical advised.   Electronically signed by: Howard Pouch, DO [place]

## 2016-02-20 NOTE — Patient Instructions (Signed)
Take flonase daily.  I will call you with lab results and better guide you with weight after I get those results.   Get a arm band, as we discussed and use for 2 weeks to see if it is helpful. If discomfort persists then we will need to consider referral. Try to not to sleep on a bended arm.   Exercise > 150 minutes a week.  Try using -->  http://www.calculator.net/calorie-calculator.html This can guide you on amount of calories needed for your daily intake.    Please help [place] help you:  We are honored you have chosen [facility] for your Primary Care home. Below you will find basic instructions that you may need to access in the future. Please help [place] help you by reading the instructions, which cover many of the frequent questions we experience.   Prescription refills and request:  -In order to allow more efficient response time, please call your pharmacy for all refills. They will forward the request electronically to [place]. This allows for the quickest possible response. Request left on a nurse line can take longer to refill, since these are checked as time allows between office patients and other phone calls.  - refill request can take up to 3-5 working days to complete.  - If request is sent electronically and request is appropiate, it is usually completed in 1-2 business days.  - all patients will need to be seen routinely for all chronic medical conditions requiring prescription medications (see follow-up below). If you are overdue for follow up on your condition, you will be asked to make an appointment and we will call in enough medication to cover you until your appointment (up to 30 days).  - all controlled substances will require a face to face visit to request/refill.  - if you desire your prescriptions to go through a new pharmacy, and have an active script at original pharmacy, you will need to call your pharmacy and have scripts transferred to new pharmacy. This is completed  between the pharmacy locations and not by your provider.    Results: If any images or labs were ordered, it can take up to 1 week to get results depending on the test ordered and the lab/facility running and resulting the test. - Normal or stable results, which do not need further discussion, will be released to your mychart immediately with attached note to you. A call will not be generated for normal results. Please make certain to sign up for mychart. If you have questions on how to activate your mychart you can call the front office.  - If your results need further discussion, our office will attempt to contact you via phone, and if unable to reach you after 2 attempts, we will release your abnormal result to your mychart with instructions.  - All results will be automatically released in mychart after 1 week.  - Your provider will provide you with explanation and instruction on all relevant material in your results. Please keep in mind, results and labs may appear confusing or abnormal to the untrained eye, but it does not mean they are actually abnormal for you personally. If you have any questions about your results that are not covered, or you desire more detailed explanation than what was provided, you should make an appointment with your provider to do so.   Our office handles many outgoing and incoming calls daily. If we have not contacted you within 1 week about your results, please check  your mychart to see if there is a message first and if not, then contact our office.  In helping with this matter, you help decrease call volume, and therefore allow [place] to be able to respond to patients needs more efficiently.   Acute office visits (sick visit):  An acute visit is intended for a new problem and are scheduled in shorter time slots to allow schedule openings for patients with new problems. This is the appropriate visit to discuss a new problem. In order to provide you with excellent quality  medical care with proper time for you to explain your problem, have an exam and receive treatment with instructions, these appointments should be limited to one new problem per visit. If you experience a new problem, in which you desire to be addressed, please make an acute office visit, we save openings on the schedule to accommodate you. Please do not save your new problem for any other type of visit, let us take care of it properly and quickly for you.   Follow up visits:  Depending on your condition(s) your provider will need to see you routinely in order to provide you with quality care and prescribe medication(s). Most chronic conditions (Example: hypertension, Diabetes, depression/anxiety... etc), require visits a couple times a year. Your provider will instruct you on proper follow up for your personal medical conditions and history. Please make certain to make follow up appointments for your condition as instructed. Failing to do so could result in lapse in your medication treatment/refills. If you request a refill, and are overdue to be seen on a condition, we will always provide you with a 30 day script (once) to allow you time to schedule.    Medicare wellness (well visit): - we have a wonderful Nurse Maudie Mercury), that will meet with you and provide you will yearly medicare wellness visits. These visits should occur yearly (can not be scheduled less than 1 calendar year apart) and cover preventive health, immunizations, advance directives and screenings you are entitled to yearly through your medicare benefits. Do not miss out on your entitled benefits, this is when medicare will pay for these benefits to be ordered for you.  These are strongly encouraged by your provider and is the appropriate type of visit to make certain you are up to date with all preventive health benefits. If you have not had your medicare wellness exam in the last 12 months, please make certain to schedule one by calling the  office and schedule your medicare wellness with Maudie Mercury as soon as possible.   Yearly physical (well visit):  - Adults are recommended to be seen yearly for physicals. Check with your insurance and date of your last physical, most insurances require one calendar year between physicals. Physicals include all preventive health topics, screenings, medical exam and labs that are appropriate for gender/age and history. You may have fasting labs needed at this visit. This is a well visit (not a sick visit), acute topics should not be covered during this visit.  - Pediatric patients are seen more frequently when they are younger. Your provider will advise you on well child visit timing that is appropriate for your their age. - This is not a medicare wellness visit. Medicare wellness exams do not have an exam portion to the visit. Some medicare companies allow for a physical, some do not allow a yearly physical. If your medicare allows a yearly physical you can schedule the medicare wellness with our nurse Maudie Mercury and have  your physical with your provider after, on the same day. Please check with insurance for your full benefits.   Late Policy/No Shows:  - all new patients should arrive 15-30 minutes earlier than appointment to allow [place] time  to  obtain all personal demographics,  insurance information and for you to complete office paperwork. - All established patients should arrive 10-15 minutes earlier than appointment time to update all information and be checked in .  - In our best efforts to run on time, if you are late for your appointment you will be asked to either reschedule or if able, we will work you back into the schedule. There will be a wait time to work you back in the schedule,  depending on availability.  - If you are unable to make it to your appointment as scheduled, please call 24 hours ahead of time to allow [place] to fill the time slot with someone else who needs to be seen. If you do not cancel your  appointment ahead of time, you may be charged a no show fee.

## 2016-02-21 LAB — HEMOGLOBIN A1C
Hgb A1c MFr Bld: 5.2 % (ref <5.7)
Mean Plasma Glucose: 103 mg/dL

## 2016-02-23 ENCOUNTER — Telehealth: Payer: Self-pay | Admitting: Family Medicine

## 2016-02-23 MED ORDER — LEVOTHYROXINE SODIUM 25 MCG PO TABS: 25.0000 ug | ORAL_TABLET | Freq: Every day | ORAL | 3 refills | Status: DC

## 2016-02-23 NOTE — Telephone Encounter (Signed)
Spoke with patient reviewed lab results and instructions with patient. Patient verbalized understanding . 

## 2016-02-23 NOTE — Telephone Encounter (Signed)
Please call pt: - her labs are normal.  - She had concerns about weight gain. I would recommend she exercise > 150 minutes a week, and monitor calorie intake using the calorie calculator site provided on AVS and try Myfittnesspal (phone APP to help track). - The PREP program at [employer] has also been helpful to others. I would encourage her to look into this program.

## 2016-04-06 ENCOUNTER — Ambulatory Visit: Payer: BLUE CROSS/BLUE SHIELD | Admitting: Internal Medicine

## 2016-05-28 ENCOUNTER — Ambulatory Visit (INDEPENDENT_AMBULATORY_CARE_PROVIDER_SITE_OTHER): Payer: BLUE CROSS/BLUE SHIELD | Admitting: Internal Medicine

## 2016-05-28 ENCOUNTER — Encounter: Payer: Self-pay | Admitting: Internal Medicine

## 2016-05-28 VITALS — BP 104/66 | HR 65 | Temp 98.2°F | Ht 62.0 in | Wt 187.4 lb

## 2016-05-28 DIAGNOSIS — E039 Hypothyroidism, unspecified: Secondary | ICD-10-CM | POA: Diagnosis not present

## 2016-05-28 LAB — T4, FREE: Free T4: 0.97 ng/dL (ref 0.60–1.60)

## 2016-05-28 LAB — TSH: TSH: 0.87 u[IU]/mL (ref 0.35–4.50)

## 2016-05-28 MED ORDER — LEVOTHYROXINE SODIUM 25 MCG PO TABS: 25.0000 ug | ORAL_TABLET | Freq: Every day | ORAL | 3 refills | Status: DC

## 2016-05-28 NOTE — Progress Notes (Signed)
Patient ID: Cathy Baxter, female   DOB: [birth year], 56 y.o.   MRN: [id number]   HPI  Cathy Baxter is a 56 y.o.-year-old female, initially referred by her gastroenterologist, Dr. Collene Mares, now returning for uncontrolled hypothyroidism, dx 2014  I reviewed pt's thyroid tests: Lab Results  Component Value Date   TSH 1.09 02/20/2016   TSH 2.50 10/17/2015   FREET4 0.90 10/17/2015   T3FREE 3.1 10/17/2015  09/09/2015: TSH 0.25 (0.4-4.3) Reportedly normal thyroid tests before.  Thyroid Abs were normal: Component     Latest Ref Rng & Units 10/17/2015  Thyroglobulin Ab     <2 IU/mL <1  Thyroperoxidase Ab SerPl-aCnc     <9 IU/mL <1   Pt is on levothyroxine 25 mcg daily (dose decreased in 08/2015), taken: - in am - fasting - at least 30 min from b'fast - no Ca, Fe, MVI - + PPIs in the evenings - not on Biotin  Pt denies: - feeling nodules in neck - hoarseness - dysphagia - choking - SOB with lying down  She has + FH of thyroid disorders in: sister, daughter (who also has celiac ds). No FH of thyroid cancer. No h/o radiation tx to head or neck.  No seaweed or kelp. No recent contrast studies. No herbal supplements. No Biotin use. No recent steroids use.   New dx of glaucoma.  ROS: Constitutional: + weight gain, + fatigue, + subjective hypothermia, + nocturia Eyes: + blurry vision, no xerophthalmia ENT: no sore throat, no nodules palpated in throat, + dysphagia, no odynophagia, no hoarseness Cardiovascular: no CP/no SOB/+ palpitations/+ leg swelling Respiratory: + Cough, + wheezing Gastrointestinal: no N/no V/no D/+ C/+ acid reflux Musculoskeletal: no muscle aches/no joint aches Skin: no rashes, no hair loss Neurological: no tremors/no numbness/no tingling/no dizziness + Irritability  I reviewed pt's medications, allergies, PMH, social hx, family hx, and changes were documented in the history of present illness. Otherwise, unchanged from my initial visit note.  Past Medical  History:  Diagnosis Date  . Allergy   . Anemia   . Arthritis   . Asthma   . Chicken pox   . GERD (gastroesophageal reflux disease)   . Hiatal hernia   . Pigmentary glaucoma of both eyes   . Polyp of stomach   . Shingles   . Thyroid disease   . Urinary incontinence   . Uterine fibroid    Past Surgical History:  Procedure Laterality Date  . CHOLECYSTECTOMY  2002  . KNEE ARTHROSCOPY Left 08/2015  . NASAL SINUS SURGERY  2000   Social History   Social History  . Marital status: Divorced    Spouse name: N/A  . Number of children: 3   Occupational History  . sales   Social History Main Topics  . Smoking status: former Smoker, quit in the 18s  . Smokeless tobacco: Never Used  . Alcohol use 1.2 - 1.8 oz/week    2 - 3 Cans of beer per week     Comment: 2 -3 days   . Drug use: No   Current Outpatient Prescriptions on File Prior to Visit  Medication Sig Dispense Refill  . cetirizine (ZYRTEC) 10 MG tablet Take 10 mg by mouth daily as needed for allergies.     Marland Kitchen dicyclomine (BENTYL) 20 MG tablet Take 1 tablet (20 mg total) by mouth 2 (two) times daily. (Patient not taking: Reported on 02/20/2016) 20 tablet 0  . dorzolamide-timolol (COSOPT) 22.3-6.8 MG/ML ophthalmic solution Place 1 drop into both eyes  2 (two) times daily.     . fluticasone (FLONASE) 50 MCG/ACT nasal spray Place 2 sprays into both nostrils daily. (Patient taking differently: Place 2 sprays into both nostrils daily as needed (for nasal congestion). ) 16 g 6  . levothyroxine (SYNTHROID, LEVOTHROID) 25 MCG tablet Take 1 tablet (25 mcg total) by mouth daily before breakfast. 90 tablet 3  . loperamide (IMODIUM) 2 MG capsule Take 1 capsule (2 mg total) by mouth 4 (four) times daily as needed for diarrhea or loose stools. 12 capsule 0  . mometasone-formoterol (DULERA) 100-5 MCG/ACT AERO Inhale 2 puffs into the lungs 2 (two) times daily as needed (for exacerbation of asthma symtoms).     . pantoprazole (PROTONIX) 40 MG tablet  Take 40 mg by mouth 2 (two) times daily. Reported on 05/09/2015    . valACYclovir (VALTREX) 1000 MG tablet      No current facility-administered medications on file prior to visit.    Allergies  Allergen Reactions  . Percocet [Oxycodone-Acetaminophen] Shortness Of Breath  . Amoxicillin Hives  . Demerol [Meperidine] Palpitations   Family History  Problem Relation Age of Onset  . Heart disease Mother   . Early death Father   . Cancer Son 10       Neuroepithelioma     PE: BP 104/66 (BP Location: Left Arm, Patient Position: Sitting, Cuff Size: Normal)   Pulse 65   Temp 98.2 F (36.8 C) (Oral)   Ht 5\' 2"  (1.575 m)   Wt 187 lb 6.4 oz (85 kg)   SpO2 98%   BMI 34.28 kg/m  Wt Readings from Last 3 Encounters:  05/28/16 187 lb 6.4 oz (85 kg)  02/20/16 188 lb 4 oz (85.4 kg)  01/08/16 188 lb (85.3 kg)   Constitutional: overweight, in NAD Eyes: PERRLA, EOMI, no exophthalmos ENT: moist mucous membranes, no thyromegaly, no cervical lymphadenopathy Cardiovascular: RRR,  + 1/6 SEM, no RG Respiratory: CTA B Gastrointestinal: abdomen soft, NT, ND, BS+ Musculoskeletal: no deformities, strength intact in all 4 Skin: moist, warm, no rashes Neurological: no tremor with outstretched hands, DTR normal in all 4  ASSESSMENT: 1. Hypothyroidism  2. Obesity  PLAN:  1. Patient with 3 years h/o hypothyroidism, on levothyroxine therapy. She had hyperthyroid sxs: hyperdefecation, fatigue, weight loss this summer >> referred to GI >> TSH found to be low >> LT4 dose decreased from 50 to 25 mcg daily. Sxs improved after this, but she developed weight gain, constipation, irritability. These are now better. - latest thyroid labs reviewed with pt >> normal  - she continues on LT4 25 mcg daily - we discussed about taking the thyroid hormone every day, with water, >30 minutes before breakfast, separated by >4 hours from acid reflux medications, calcium, iron, multivitamins. Pt. is taking it correctly  now. - will check thyroid tests today: TSH and fT4 - If labs are abnormal, she will need to return for repeat TFTs in 1.5 months - OTW, RTC in 1 year for a visit and  6 mo for labs  2. Obesity - We discussed at length about what to do to improve her weight. I suggested a plant-based diet and gave her materials..   Needs refills.  Office Visit on 05/28/2016  Component Date Value Ref Range Status  . TSH 05/28/2016 0.87  0.35 - 4.50 uIU/mL Final  . Free T4 05/28/2016 0.97  0.60 - 1.60 ng/dL Final   Comment: Specimens from patients who are undergoing biotin therapy and /or ingesting biotin supplements  may contain high levels of biotin.  The higher biotin concentration in these specimens interferes with this Free T4 assay.  Specimens that contain high levels  of biotin may cause false high results for this Free T4 assay.  Please interpret results in light of the total clinical presentation of the patient.     Labs are great. We'll continue the current dose of levothyroxine.  Philemon Kingdom, MD PhD [facility] Endocrinology

## 2016-05-28 NOTE — Patient Instructions (Addendum)
Please continue Levothyroxine 25 mcg daily.  Take the thyroid hormone every day, with water, at least 30 minutes before breakfast, separated by at least 4 hours from: - acid reflux medications - calcium - iron - multivitamins  Please come back in 1 year but in 6 months for labs.  Plant-based diet materials: - Lectures (you tube):  Alyssa Grove: "Breaking the Food Seduction"  Doug Lisle: "How to Lose Weight, without Losing Your Mind" - Documentaries:  [place] over [place] and Nearly Dead  The [employer] of the [place]  Overweight and undernourished - Books:  Alyssa Grove: "Program for Reversing Diabetes"  Alyssa Grove: "[employer]ger: "How Not to Die"  Heath Gold: "The [place] Study"  Norma Fredrickson: "Supermarket Vegan" (cookbook) - Facebook pages:   Moshe Salis[place] versus Knives  Nutrition facts  Vegucated  [place]

## 2016-05-31 ENCOUNTER — Encounter (HOSPITAL_BASED_OUTPATIENT_CLINIC_OR_DEPARTMENT_OTHER): Payer: Self-pay | Admitting: Emergency Medicine

## 2016-05-31 ENCOUNTER — Emergency Department (HOSPITAL_BASED_OUTPATIENT_CLINIC_OR_DEPARTMENT_OTHER)
Admission: EM | Admit: 2016-05-31 | Discharge: 2016-05-31 | Disposition: A | Discharge status: Home / Self Care | Payer: BLUE CROSS/BLUE SHIELD | Attending: Emergency Medicine | Admitting: Emergency Medicine

## 2016-05-31 ENCOUNTER — Telehealth: Payer: Self-pay

## 2016-05-31 DIAGNOSIS — T7840XA Allergy, unspecified, initial encounter: Secondary | ICD-10-CM | POA: Diagnosis present

## 2016-05-31 DIAGNOSIS — E039 Hypothyroidism, unspecified: Secondary | ICD-10-CM | POA: Diagnosis not present

## 2016-05-31 DIAGNOSIS — Z87891 Personal history of nicotine dependence: Secondary | ICD-10-CM | POA: Insufficient documentation

## 2016-05-31 DIAGNOSIS — T3695XA Adverse effect of unspecified systemic antibiotic, initial encounter: Secondary | ICD-10-CM | POA: Insufficient documentation

## 2016-05-31 DIAGNOSIS — J45909 Unspecified asthma, uncomplicated: Secondary | ICD-10-CM | POA: Insufficient documentation

## 2016-05-31 DIAGNOSIS — T50905A Adverse effect of unspecified drugs, medicaments and biological substances, initial encounter: Secondary | ICD-10-CM

## 2016-05-31 DIAGNOSIS — Z79899 Other long term (current) drug therapy: Secondary | ICD-10-CM | POA: Insufficient documentation

## 2016-05-31 MED ORDER — LORATADINE 10 MG PO TABS: 10.0000 mg | ORAL_TABLET | Freq: Every day | ORAL | 0 refills | Status: DC

## 2016-05-31 NOTE — ED Provider Notes (Signed)
[facility]PT [facility] Provider Note   CSN: [id number] Arrival date & time: 05/31/16  1217     History   Chief Complaint Chief Complaint  Patient presents with  . Facial Swelling  . Allergic Reaction    HPI Cathy Baxter is a 56 y.o. female.  HPI Patient presents to the emergency room for evaluation for facial swelling. Patient states she was seen at an urgent care this past week for sinus congestion and inflammation. She had been noticing green nasal drainage. She was prescribed doxycycline and prednisone. His morning when she woke up she felt that her face was swollen and she was itchy all over. She also felt that her throat was clogged. She denied any difficulty speaking. She denied any difficulty breathing. She did not notice any rash.  Patient states she called the clinic was told to come to the emergency room for evaluation Past Medical History:  Diagnosis Date  . Allergy   . Anemia   . Arthritis   . Asthma   . Chicken pox   . GERD (gastroesophageal reflux disease)   . Hiatal hernia   . Pigmentary glaucoma of both eyes   . Polyp of stomach   . Shingles   . Thyroid disease   . Urinary incontinence   . Uterine fibroid     Patient Active Problem List   Diagnosis Date Noted  . BMI 34.0-34.9,adult 02/20/2016  . Cataract of both eyes 02/20/2016  . Pigmentary glaucoma of both eyes   . Hiatal hernia 11/06/2015  . Hyperlipidemia 11/06/2015  . Asthma 11/06/2015  . Uterine fibroids in pregnancy, postpartum condition 11/06/2015  . Hypothyroidism 10/17/2015    Past Surgical History:  Procedure Laterality Date  . CHOLECYSTECTOMY  2002  . KNEE ARTHROSCOPY Left 08/2015  . NASAL SINUS SURGERY  2000    OB History    Gravida Para Term Preterm AB Living   3 3 3     2    SAB TAB Ectopic Multiple Live Births                   Home Medications    Prior to Admission medications   Medication Sig Start Date End Date Taking? Authorizing Provider  cetirizine (ZYRTEC)  10 MG tablet Take 10 mg by mouth daily as needed for allergies.     [provider]  dicyclomine (BENTYL) 20 MG tablet Take 1 tablet (20 mg total) by mouth 2 (two) times daily. 01/08/16   Larene Pickett, PA-C  dorzolamide-timolol (COSOPT) 22.3-6.8 MG/ML ophthalmic solution Place 1 drop into both eyes 2 (two) times daily.  09/18/15   [provider]  doxycycline (VIBRA-TABS) 100 MG tablet  05/20/16   [provider]  fluticasone (FLONASE) 50 MCG/ACT nasal spray Place 2 sprays into both nostrils daily. Patient taking differently: Place 2 sprays into both nostrils daily as needed (for nasal congestion).  05/09/15   Brayton Caves, PA-C  latanoprost (XALATAN) 0.005 % ophthalmic solution  05/24/16   [provider]  levothyroxine (SYNTHROID, LEVOTHROID) 25 MCG tablet Take 1 tablet (25 mcg total) by mouth daily before breakfast. 05/28/16   Philemon Kingdom, MD  loperamide (IMODIUM) 2 MG capsule Take 1 capsule (2 mg total) by mouth 4 (four) times daily as needed for diarrhea or loose stools. 01/08/16   Larene Pickett, PA-C  loratadine (CLARITIN) 10 MG tablet Take 1 tablet (10 mg total) by mouth daily. 05/31/16   Dorie Rank, MD  mometasone-formoterol [facility]) 100-5 MCG/ACT  AERO Inhale 2 puffs into the lungs 2 (two) times daily as needed (for exacerbation of asthma symtoms).     [provider]  pantoprazole (PROTONIX) 40 MG tablet Take 40 mg by mouth 2 (two) times daily. Reported on 05/09/2015    [provider]  predniSONE (STERAPRED UNI-PAK 48 TAB) 10 MG (48) TBPK tablet  05/20/16   [provider]  valACYclovir (VALTREX) 1000 MG tablet  02/13/16   [provider]    Family History Family History  Problem Relation Age of Onset  . Heart disease Mother   . Early death Father   . Cancer Son 46       Neuroepithelioma     Social History Social History  Substance Use Topics  . Smoking status: Former Smoker    Types: Cigarettes  .  Smokeless tobacco: Never Used  . Alcohol use No     Allergies   Percocet [oxycodone-acetaminophen]; Amoxicillin; and Demerol [meperidine]   Review of Systems Review of Systems  All other systems reviewed and are negative.    Physical Exam Updated Vital Signs BP (!) 128/94   Pulse 76   Temp 99.1 F (37.3 C) (Oral)   Resp 18   Ht 1.575 m (5\' 2" )   Wt 84.8 kg (187 lb)   SpO2 100%   BMI 34.20 kg/m   Physical Exam  Constitutional: She appears well-developed and well-nourished. No distress.  HENT:  Head: Normocephalic and atraumatic.  Right Ear: External ear normal.  Left Ear: External ear normal.  Mouth/Throat: No oropharyngeal exudate.  No edema  Eyes: Conjunctivae are normal. Pupils are equal, round, and reactive to light. Right eye exhibits no discharge. Left eye exhibits no discharge. No scleral icterus.  Neck: Normal range of motion. Neck supple. No tracheal deviation present.  No neck swelling  Cardiovascular: Normal rate, regular rhythm and normal heart sounds.   Pulmonary/Chest: Effort normal and breath sounds normal. No stridor. No respiratory distress.  No stridor, no wheezes  Abdominal: She exhibits no distension.  Musculoskeletal: She exhibits no edema.  Lymphadenopathy:    She has no cervical adenopathy.  Neurological: She is alert. Cranial nerve deficit: no gross deficits.  Skin: Skin is warm and dry. No rash noted.  Psychiatric: She has a normal mood and affect.  Nursing note and vitals reviewed.    ED Treatments / Results    Radiology No results found.  Procedures Procedures (including critical care time)  Medications Ordered in ED Medications - No data to display   Initial Impression / Assessment and Plan / ED Course  I have reviewed the triage vital signs and the nursing notes.  Pertinent labs & imaging results that were available during my care of the patient were reviewed by me and considered in my medical decision making (see chart  for details).   patient felt like she was having an allergic reaction to the antibiotics. I don't see any objective evidence of allergic reaction at this point however her symptoms are improving.  I do not think she needs to continue any antibiotics.  I would have her continue her steroids and I will add on antihistamines. Final Clinical Impressions(s) / ED Diagnoses   Final diagnoses:  Adverse effect of drug, initial encounter    New Prescriptions New Prescriptions   LORATADINE (CLARITIN) 10 MG TABLET    Take 1 tablet (10 mg total) by mouth daily.     Dorie Rank, MD 05/31/16 1321

## 2016-05-31 NOTE — Telephone Encounter (Signed)
-----   Message from Philemon Kingdom, MD sent at 05/28/2016  5:30 PM EDT ----- Cathy Baxter, can you please call pt: Labs are great. I refilled her levothyroxine.

## 2016-05-31 NOTE — Telephone Encounter (Signed)
Called patient and gave lab results. Patient had no questions or concerns.  

## 2016-05-31 NOTE — ED Notes (Signed)
Pt reports that her "throat feels clogged" since about 0730 this morning.  Pt has been taking antibx for past 4 days for sinus infection.  States she "feels itchy" all over but has not noticed any rash.

## 2016-05-31 NOTE — ED Triage Notes (Signed)
Pt states she feels her face swelling and throat swelling several hours ago, pt talking in full sentences, NAD notd

## 2016-05-31 NOTE — Discharge Instructions (Signed)
Continue the steroids, take antihistamines as prescribed, he can also take over-the-counter decongestants for sinus symptoms. Follow-up with primary doctor if not improving over the next week

## 2016-06-02 ENCOUNTER — Other Ambulatory Visit: Payer: Self-pay | Admitting: Physician Assistant

## 2016-06-17 ENCOUNTER — Encounter (HOSPITAL_BASED_OUTPATIENT_CLINIC_OR_DEPARTMENT_OTHER): Payer: Self-pay

## 2016-06-17 DIAGNOSIS — E039 Hypothyroidism, unspecified: Secondary | ICD-10-CM | POA: Diagnosis not present

## 2016-06-17 DIAGNOSIS — J45909 Unspecified asthma, uncomplicated: Secondary | ICD-10-CM | POA: Insufficient documentation

## 2016-06-17 DIAGNOSIS — Z87891 Personal history of nicotine dependence: Secondary | ICD-10-CM | POA: Insufficient documentation

## 2016-06-17 DIAGNOSIS — L03011 Cellulitis of right finger: Secondary | ICD-10-CM | POA: Insufficient documentation

## 2016-06-17 DIAGNOSIS — M79644 Pain in right finger(s): Secondary | ICD-10-CM | POA: Diagnosis present

## 2016-06-17 DIAGNOSIS — Z79899 Other long term (current) drug therapy: Secondary | ICD-10-CM | POA: Diagnosis not present

## 2016-06-17 NOTE — ED Triage Notes (Signed)
Pt has swelling and redness around nailbed to index finger on right hand

## 2016-06-18 ENCOUNTER — Emergency Department (HOSPITAL_BASED_OUTPATIENT_CLINIC_OR_DEPARTMENT_OTHER)
Admission: EM | Admit: 2016-06-18 | Discharge: 2016-06-18 | Disposition: A | Discharge status: Home / Self Care | Payer: BLUE CROSS/BLUE SHIELD | Attending: Emergency Medicine | Admitting: Emergency Medicine

## 2016-06-18 DIAGNOSIS — L03011 Cellulitis of right finger: Secondary | ICD-10-CM

## 2016-06-18 MED ORDER — LIDOCAINE HCL 2 % IJ SOLN
5.0000 mL | Freq: Once | INTRAMUSCULAR | Status: DC
  Filled 2016-06-18: qty 20

## 2016-06-18 MED ORDER — TRAMADOL HCL 50 MG PO TABS: 50.0000 mg | ORAL_TABLET | Freq: Four times a day (QID) | ORAL | 0 refills | Status: DC | PRN

## 2016-06-18 MED ORDER — SULFAMETHOXAZOLE-TRIMETHOPRIM 800-160 MG PO TABS: 2.0000 | ORAL_TABLET | Freq: Two times a day (BID) | ORAL | 0 refills | Status: AC

## 2016-06-18 NOTE — ED Provider Notes (Signed)
[facility] Provider Note   CSN: [id number] Arrival date & time: 06/17/16  2330     History   Chief Complaint Chief Complaint  Patient presents with  . Hand Pain    HPI Cathy Baxter is a 56 y.o. female.  HPI  Pt presentign with redness and pain around edge of fingernail of right index finger.  She states she first noted the pain and swelling this morning and it has been getting worse throughout the day today.  She states she is not sure but thinks she had a small cut in that area prior.  Pain is worse with palpation but is constantly throbbing.  No drainage from wound.  No fever/chills.  She has not had any treatment prior to arrival.  There are no other associated systemic symptoms, there are no other alleviating or modifying factors.   Past Medical History:  Diagnosis Date  . Allergy   . Anemia   . Arthritis   . Asthma   . Chicken pox   . GERD (gastroesophageal reflux disease)   . Hiatal hernia   . Pigmentary glaucoma of both eyes   . Polyp of stomach   . Shingles   . Thyroid disease   . Urinary incontinence   . Uterine fibroid     Patient Active Problem List   Diagnosis Date Noted  . BMI 34.0-34.9,adult 02/20/2016  . Cataract of both eyes 02/20/2016  . Pigmentary glaucoma of both eyes   . Hiatal hernia 11/06/2015  . Hyperlipidemia 11/06/2015  . Asthma 11/06/2015  . Uterine fibroids in pregnancy, postpartum condition 11/06/2015  . Hypothyroidism 10/17/2015    Past Surgical History:  Procedure Laterality Date  . CHOLECYSTECTOMY  2002  . KNEE ARTHROSCOPY Left 08/2015  . NASAL SINUS SURGERY  2000    OB History    Gravida Para Term Preterm AB Living   3 3 3     2    SAB TAB Ectopic Multiple Live Births                   Home Medications    Prior to Admission medications   Medication Sig Start Date End Date Taking? Authorizing Provider  cetirizine (ZYRTEC) 10 MG tablet Take 10 mg by mouth daily as needed for allergies.     [provider]  dicyclomine (BENTYL) 20 MG tablet Take 1 tablet (20 mg total) by mouth 2 (two) times daily. 01/08/16   Larene Pickett, PA-C  dorzolamide-timolol (COSOPT) 22.3-6.8 MG/ML ophthalmic solution Place 1 drop into both eyes 2 (two) times daily.  09/18/15   [provider]  doxycycline (VIBRA-TABS) 100 MG tablet  05/20/16   [provider]  fluticasone (FLONASE) 50 MCG/ACT nasal spray Place 2 sprays into both nostrils daily. Patient taking differently: Place 2 sprays into both nostrils daily as needed (for nasal congestion).  05/09/15   Brayton Caves, PA-C  latanoprost (XALATAN) 0.005 % ophthalmic solution  05/24/16   [provider]  levothyroxine (SYNTHROID, LEVOTHROID) 25 MCG tablet Take 1 tablet (25 mcg total) by mouth daily before breakfast. 05/28/16   Philemon Kingdom, MD  loperamide (IMODIUM) 2 MG capsule Take 1 capsule (2 mg total) by mouth 4 (four) times daily as needed for diarrhea or loose stools. 01/08/16   Larene Pickett, PA-C  loratadine (CLARITIN) 10 MG tablet Take 1 tablet (10 mg total) by mouth daily. 05/31/16   Dorie Rank, MD  mometasone-formoterol (DULERA) 100-5 MCG/ACT AERO Inhale 2 puffs  into the lungs 2 (two) times daily as needed (for exacerbation of asthma symtoms).     [provider]  pantoprazole (PROTONIX) 40 MG tablet Take 40 mg by mouth 2 (two) times daily. Reported on 05/09/2015    [provider]  predniSONE (STERAPRED UNI-PAK 48 TAB) 10 MG (48) TBPK tablet  05/20/16   [provider]  sulfamethoxazole-trimethoprim (BACTRIM DS,SEPTRA DS) 800-160 MG tablet Take 2 tablets by mouth 2 (two) times daily. 06/18/16 06/25/16  Alfonzo Beers, MD  traMADol (ULTRAM) 50 MG tablet Take 1 tablet (50 mg total) by mouth every 6 (six) hours as needed. 06/18/16   Alfonzo Beers, MD  valACYclovir (VALTREX) 1000 MG tablet  02/13/16   [provider]    Family History Family History  Problem Relation Age of Onset  . Heart  disease Mother   . Early death Father   . Cancer Son 58       Neuroepithelioma     Social History Social History  Substance Use Topics  . Smoking status: Former Smoker    Types: Cigarettes  . Smokeless tobacco: Never Used  . Alcohol use No     Allergies   Percocet [oxycodone-acetaminophen]; Amoxicillin; and Demerol [meperidine]   Review of Systems Review of Systems  ROS reviewed and all otherwise negative except for mentioned in HPI   Physical Exam Updated Vital Signs BP 130/69 (BP Location: Left Arm)   Pulse 72   Temp 98.7 F (37.1 C) (Oral)   Resp 18   Ht 5\' 2"  (1.575 m)   Wt 84.8 kg (187 lb)   SpO2 100%   BMI 34.20 kg/m  Vitals reviewed Physical Exam Physical Examination: General appearance - alert, well appearing, and in no distress Mental status - alert, oriented to person, place, and time Eyes - no conjunctival injection, no scleral icterus Neurological - alert, oriented x 3, sensation intact distally in finger, full flexion and extension intact Musculoskeletal - no joint tenderness, deformity or swelling Extremities - peripheral pulses normal, no pedal edema, no clubbing or cyanosis Skin - normal coloration and turgor, paronychia present at lateral nail edge of right index finger with associated erythema and soft tissue swelling, no felon  ED Treatments / Results  Labs (all labs ordered are listed, but only abnormal results are displayed) Labs Reviewed - No data to display  EKG  EKG Interpretation None       Radiology No results found.  Procedures .Marland KitchenIncision and Drainage Date/Time: 06/18/2016 3:03 AM Performed by: Alfonzo Beers Authorized by: Alfonzo Beers   Consent:    Consent obtained:  Verbal   Consent given by:  Patient   Risks discussed:  Bleeding, incomplete drainage and pain Location:    Type:  Abscess   Location:  Upper extremity   Upper extremity location:  Finger   Finger location:  R index finger Pre-procedure details:     Skin preparation:  Chloraprep Anesthesia (see MAR for exact dosages):    Anesthesia method:  Nerve block   Block location:  Right index finger, digital block   Block needle gauge:  27 G   Block anesthetic:  Lidocaine 2% w/o epi   Block injection procedure:  Anatomic landmarks identified, negative aspiration for blood, anatomic landmarks palpated, introduced needle and incremental injection   Block outcome:  Anesthesia achieved Procedure type:    Complexity:  Simple Procedure details:    Incision types:  Single straight   Incision depth:  Dermal   Scalpel blade:  11  Drainage:  Purulent   Drainage amount:  Copious   Wound treatment:  Wound left open   Packing materials:  None Post-procedure details:    Patient tolerance of procedure:  Tolerated well, no immediate complications   (including critical care time)  Medications Ordered in ED Medications  lidocaine (XYLOCAINE) 2 % (with pres) injection 100 mg (not administered)     Initial Impression / Assessment and Plan / ED Course  I have reviewed the triage vital signs and the nursing notes.  Pertinent labs & imaging results that were available during my care of the patient were reviewed by me and considered in my medical decision making (see chart for details).     Pt presenting with paronychia of right index finger.  This was drained with copious pus output. No felon present. Pt started on bactrim.  Discharged with strict return precautions.  Pt agreeable with plan.  Final Clinical Impressions(s) / ED Diagnoses   Final diagnoses:  Paronychia of right index finger    New Prescriptions Discharge Medication List as of 06/18/2016  3:03 AM    START taking these medications   Details  sulfamethoxazole-trimethoprim (BACTRIM DS,SEPTRA DS) 800-160 MG tablet Take 2 tablets by mouth 2 (two) times daily., Starting Fri 06/18/2016, Until Fri 06/25/2016, Print    traMADol (ULTRAM) 50 MG tablet Take 1 tablet (50 mg total) by mouth every  6 (six) hours as needed., Starting Fri 06/18/2016, Print         Alfonzo Beers, MD 06/18/16 [phone]

## 2016-06-18 NOTE — Discharge Instructions (Signed)
Return to the ED with any concerns including increased pain or swelling, increased redness of finger or streaking up the finger or hand, fever/chills, or any other alarming symptoms

## 2016-09-02 ENCOUNTER — Ambulatory Visit (INDEPENDENT_AMBULATORY_CARE_PROVIDER_SITE_OTHER): Payer: BLUE CROSS/BLUE SHIELD | Admitting: Family Medicine

## 2016-09-02 ENCOUNTER — Encounter: Payer: Self-pay | Admitting: Family Medicine

## 2016-09-02 VITALS — BP 106/74 | HR 69 | Temp 97.8°F | Resp 20 | Ht 62.0 in | Wt 186.0 lb

## 2016-09-02 DIAGNOSIS — R600 Localized edema: Secondary | ICD-10-CM

## 2016-09-02 LAB — COMPREHENSIVE METABOLIC PANEL
ALT: 36 U/L — ABNORMAL HIGH (ref 0–35)
AST: 26 U/L (ref 0–37)
Albumin: 4.1 g/dL (ref 3.5–5.2)
Alkaline Phosphatase: 63 U/L (ref 39–117)
BUN: 17 mg/dL (ref 6–23)
CO2: 30 mEq/L (ref 19–32)
Calcium: 9.2 mg/dL (ref 8.4–10.5)
Chloride: 104 mEq/L (ref 96–112)
Creatinine, Ser: 0.71 mg/dL (ref 0.40–1.20)
GFR: 90.26 mL/min (ref >60.00)
Glucose, Bld: 88 mg/dL (ref 70–99)
Potassium: 4.1 mEq/L (ref 3.5–5.1)
Sodium: 138 mEq/L (ref 135–145)
Total Bilirubin: 0.4 mg/dL (ref 0.2–1.2)
Total Protein: 7.1 g/dL (ref 6.0–8.3)

## 2016-09-02 LAB — CBC
HCT: 41 % (ref 36.0–46.0)
Hemoglobin: 13.4 g/dL (ref 12.0–15.0)
MCHC: 32.6 g/dL (ref 30.0–36.0)
MCV: 104.1 fl — ABNORMAL HIGH (ref 78.0–100.0)
Platelets: 392 10*3/uL (ref 150.0–400.0)
RBC: 3.94 Mil/uL (ref 3.87–5.11)
RDW: 12.6 % (ref 11.5–15.5)
WBC: 7.3 10*3/uL (ref 4.0–10.5)

## 2016-09-02 LAB — TSH: TSH: 2.17 u[IU]/mL (ref 0.35–4.50)

## 2016-09-02 NOTE — Patient Instructions (Addendum)
I have written a script for compression stockings.  I will call you with labs once resulted.   Low sodium diet, keep feet elevated when able. This is likely venous insufficiency.   Chronic Venous Insufficiency Chronic venous insufficiency, also called venous stasis, is a condition that prevents blood from being pumped effectively through the veins in your legs. Blood may no longer be pumped effectively from the legs back to the heart. This condition can range from mild to severe. With proper treatment, you should be able to continue with an active life. What are the causes? Chronic venous insufficiency occurs when the vein walls become stretched, weakened, or damaged, or when valves within the vein are damaged. Some common causes of this include:  High blood pressure inside the veins (venous hypertension).  Increased blood pressure in the leg veins from long periods of sitting or standing.  Inflammation of a vein (phlebitis) that causes a blood clot to form.   What increases the risk? The following factors may make you more likely to develop this condition:  Having a family history of this condition.  Obesity.  Pregnancy.  Living without enough physical activity or exercise (sedentary lifestyle).  Smoking.  Having a job that requires long periods of standing or sitting in one place.  Being a certain age. Women in their 30s and 63s and men in their 46s are more likely to develop this condition.  What are the signs or symptoms? Symptoms of this condition include:  Veins that are enlarged, bulging, or twisted (varicose veins).  Skin breakdown or ulcers.  Reddened or discolored skin on the front of the leg.  Brown, smooth, tight, and painful skin just above the ankle, usually on the inside of the leg (lipodermatosclerosis).  Swelling.  How is this diagnosed? This condition may be diagnosed based on:  Your medical history.  A physical exam.  Tests, such as: ? A  procedure that creates an image of a blood vessel and nearby organs and provides information about blood flow through the blood vessel (duplex ultrasound). ? A procedure that tests blood flow (plethysmography). ? A procedure to look at the veins using X-ray and dye (venogram).  How is this treated? The goals of treatment are to help you return to an active life and to minimize pain or disability. Treatment depends on the severity of your condition, and it may include:  Wearing compression stockings. These can help relieve symptoms and help prevent your condition from getting worse. However, they do not cure the condition.  Sclerotherapy. This is a procedure involving an injection of a material that "dissolves" damaged veins.  Surgery. This may involve: ? Removing a diseased vein (vein stripping). ? Cutting off blood flow through the vein (laser ablation surgery). ? Repairing a valve.  Follow these instructions at home:  Wear compression stockings as told by your health care provider. These stockings help to prevent blood clots and reduce swelling in your legs.  Take over-the-counter and prescription medicines only as told by your health care provider.  Stay active by exercising, walking, or doing different activities. Ask your health care provider what activities are safe for you and how much exercise you need.  Drink enough fluid to keep your urine clear or pale yellow.  Do not use any products that contain nicotine or tobacco, such as cigarettes and e-cigarettes. If you need help quitting, ask your health care provider.  Keep all follow-up visits as told by your health care provider. This is  important. Contact a health care provider if:  You have redness, swelling, or more pain in the affected area.  You see a red streak or line that extends up or down from the affected area.  You have skin breakdown or a loss of skin in the affected area, even if the breakdown is small.  You  get an injury in the affected area. Get help right away if:  You get an injury and an open wound in the affected area.  You have severe pain that does not get better with medicine.  You have sudden numbness or weakness in the foot or ankle below the affected area, or you have trouble moving your foot or ankle.  You have a fever and you have worse or persistent symptoms.  You have chest pain.  You have shortness of breath. Summary  Chronic venous insufficiency, also called venous stasis, is a condition that prevents blood from being pumped effectively through the veins in your legs.  Chronic venous insufficiency occurs when the vein walls become stretched, weakened, or damaged, or when valves within the vein are damaged.  Treatment for this condition depends on how severe your condition is, and it may involve wearing compression stockings or having a procedure.  Make sure you stay active by exercising, walking, or doing different activities. Ask your health care provider what activities are safe for you and how much exercise you need. This information is not intended to replace advice given to you by your health care provider. Make sure you discuss any questions you have with your health care provider. Document Released: 05/03/2006 Document Revised: 11/17/2015 Document Reviewed: 11/17/2015 Elsevier Interactive Patient Education  2017 [employer].

## 2016-09-02 NOTE — Progress Notes (Signed)
Cathy Baxter , 1960/06/14, 56 y.o., female MRN: [id number] Patient Care Team    Relationship Specialty Notifications Start End  Ma Hillock, DO PCP - General Family Medicine  08/30/16   Philemon Kingdom, MD Consulting Physician Internal Medicine  02/20/16    Comment: endocrine  Linda Hedges, DO Consulting Physician Obstetrics and Gynecology  02/20/16     Chief Complaint  Patient presents with  . Leg Swelling     Subjective: Pt presents for an OV with complaints of Bilateral leg swelling of one-week duration. Patient reports she was at the beach when her legs started to swell. She felt that her right leg was more swollen than her left leg. Her family got her concerned when they started talking about potential blood clots. She reports that this has happened once before, and resolved on its own. She states that the issue this time improved after about a week when she had her feet propped up. She tries to eat lower sodium. She does admit to drinking some alcohol while at the beach. She does have a thyroid disorder, and has not followed up for repeat TSH. She reports the swelling is almost completely resolved now.   Depression screen PHQ 2/9 02/20/2016  Decreased Interest 0  Down, Depressed, Hopeless 0  PHQ - 2 Score 0    Allergies  Allergen Reactions  . Percocet [Oxycodone-Acetaminophen] Shortness Of Breath  . Amoxicillin Hives    Has patient had a PCN reaction causing immediate rash, facial/tongue/throat swelling, SOB or lightheadedness with hypotension: Yes Has patient had a PCN reaction causing severe rash involving mucus membranes or skin necrosis: No Has patient had a PCN reaction that required hospitalization No Has patient had a PCN reaction occurring within the last 10 years: Yes  If all of the above answers are "NO", then may proceed with Cephalosporin use.   . Demerol [Meperidine] Palpitations   Social History  Substance Use Topics  . Smoking status: Former Smoker   Types: Cigarettes  . Smokeless tobacco: Never Used  . Alcohol use No   Past Medical History:  Diagnosis Date  . Allergy   . Anemia   . Arthritis   . Asthma   . Chicken pox   . GERD (gastroesophageal reflux disease)   . Hiatal hernia   . Pigmentary glaucoma of both eyes   . Polyp of stomach   . Shingles   . Thyroid disease   . Urinary incontinence   . Uterine fibroid    Past Surgical History:  Procedure Laterality Date  . CHOLECYSTECTOMY  2002  . KNEE ARTHROSCOPY Left 08/2015  . NASAL SINUS SURGERY  2000   Family History  Problem Relation Age of Onset  . Heart disease Mother   . Early death Father   . Cancer Son 10       Neuroepithelioma    Allergies as of 09/02/2016      Reactions   Percocet [oxycodone-acetaminophen] Shortness Of Breath   Amoxicillin Hives   Has patient had a PCN reaction causing immediate rash, facial/tongue/throat swelling, SOB or lightheadedness with hypotension: Yes Has patient had a PCN reaction causing severe rash involving mucus membranes or skin necrosis: No Has patient had a PCN reaction that required hospitalization No Has patient had a PCN reaction occurring within the last 10 years: Yes  If all of the above answers are "NO", then may proceed with Cephalosporin use.   Demerol [meperidine] Palpitations      Medication List  Accurate as of 09/02/16 10:14 AM. Always use your most recent med list.          dorzolamide-timolol 22.3-6.8 MG/ML ophthalmic solution Commonly known as:  COSOPT Place 1 drop into both eyes 2 (two) times daily.   fluticasone 50 MCG/ACT nasal spray Commonly known as:  FLONASE Place 2 sprays into both nostrils daily.   latanoprost 0.005 % ophthalmic solution Commonly known as:  XALATAN   levothyroxine 25 MCG tablet Commonly known as:  SYNTHROID, LEVOTHROID Take 1 tablet (25 mcg total) by mouth daily before breakfast.   loratadine 10 MG tablet Commonly known as:  CLARITIN Take 1 tablet (10 mg total)  by mouth daily.   pantoprazole 40 MG tablet Commonly known as:  PROTONIX Take 40 mg by mouth 2 (two) times daily. Reported on 05/09/2015       All past medical history, surgical history, allergies, family history, immunizations andmedications were updated in the EMR today and reviewed under the history and medication portions of their EMR.     ROS: Negative, with the exception of above mentioned in HPI   Objective:  BP 106/74 (BP Location: Right Arm, Patient Position: Sitting, Cuff Size: Large)   Pulse 69   Temp 97.8 F (36.6 C) (Oral)   Resp 20   Ht '5\' 2"'  (1.575 m)   Wt 186 lb (84.4 kg)   SpO2 97%   BMI 34.02 kg/m  Body mass index is 34.02 kg/m. Gen: Afebrile. No acute distress. Nontoxic in appearance, well developed, well nourished. Obese. HENT: AT. [place]. MMM, no oral lesions.  Eyes:Pupils Equal Round Reactive to light, Extraocular movements intact,  Conjunctiva without redness, discharge or icterus. Neck/lymp/endocrine: Supple, no lymphadenopathy, no thyromegaly CV: RRR, no edema Chest: CTAB, no wheeze or crackles. Good air movement, normal resp effort.  Abd: Soft. Obese. NTND. BS present.  MSK: No erythema, no obvious soft tissue swelling, no deformities noted. Diffuse mild tenderness to palpation bilateral lower extremities. No edema. Varicosities present bilaterally. Neurovascularly intact distally. Skin: No rashes, purpura or petechiae.  Neuro:  Normal gait. PERLA. EOMi. Alert. Oriented x3   No exam data present No results found. No results found for this or any previous visit (from the past 24 hour(s)).  Assessment/Plan: Cathy Baxter is a 56 y.o. female present for OV for  Bilateral leg edema/hypothyroidism - Suspect venous insufficiency, patient was on her feet drinking some alcohol and not eating her usual diet over the last week while at the beach in the heat. 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 Low-sodium diet. recheck labs today to ensure TSH is adequately replaced - Also discussed if discomfort increases, and she would like a referral to pain specialist we can place For her to address her varicosities which could be causing some discomfort. - Compression stockings - CBC - Comp Met (CMET) - TSH - Follow-up when necessary   Reviewed expectations re: course of current medical issues.  Discussed self-management of symptoms.  Outlined signs and symptoms indicating need for more acute intervention.  Patient verbalized understanding and all questions were answered.  Patient received an After-Visit Summary.    No orders of the defined types were placed in this encounter.    Note is dictated utilizing voice recognition software. Although note has been proof read prior to signing, occasional typographical errors still can be missed. If any questions arise, please do not hesitate to call for verification.  electronically signed by:  Howard Pouch, DO  [place]

## 2016-09-06 ENCOUNTER — Telehealth: Payer: Self-pay | Admitting: Family Medicine

## 2016-09-06 MED ORDER — LEVOTHYROXINE SODIUM 25 MCG PO TABS: 25.0000 ug | ORAL_TABLET | Freq: Every day | ORAL | 3 refills | Status: DC

## 2016-09-06 NOTE — Telephone Encounter (Signed)
Patient notified and verbalized understanding. Patient will call back to schedule an appointment with Dr. Raoul Pitch.

## 2016-09-06 NOTE — Telephone Encounter (Signed)
Please call pt: Her labs are all stable, with the small exception of increase in the size of her blood cells. This is commonly seen with alcohol consumption, thyroid disorders or certain vitamin deficiencies. Her thyroid levels are normal, I have called in refills for her on this medication.  I would recommend she start a B-Complex daily vitamin.  Avoid alcohol consumption. Follow up provider appt in 3 months for repeat labs after following above recommendations and we will discuss and repeat her labs

## 2016-09-30 ENCOUNTER — Emergency Department (HOSPITAL_COMMUNITY)
Admission: EM | Admit: 2016-09-30 | Discharge: 2016-09-30 | Disposition: A | Discharge status: Home / Self Care | Payer: BLUE CROSS/BLUE SHIELD | Attending: Emergency Medicine | Admitting: Emergency Medicine

## 2016-09-30 DIAGNOSIS — R109 Unspecified abdominal pain: Secondary | ICD-10-CM | POA: Diagnosis present

## 2016-09-30 DIAGNOSIS — Z79899 Other long term (current) drug therapy: Secondary | ICD-10-CM | POA: Diagnosis not present

## 2016-09-30 DIAGNOSIS — E039 Hypothyroidism, unspecified: Secondary | ICD-10-CM | POA: Diagnosis not present

## 2016-09-30 DIAGNOSIS — R197 Diarrhea, unspecified: Secondary | ICD-10-CM | POA: Diagnosis not present

## 2016-09-30 DIAGNOSIS — Z87891 Personal history of nicotine dependence: Secondary | ICD-10-CM | POA: Diagnosis not present

## 2016-09-30 LAB — URINALYSIS, ROUTINE W REFLEX MICROSCOPIC
Bilirubin Urine: NEGATIVE
Glucose, UA: NEGATIVE mg/dL
Hgb urine dipstick: NEGATIVE
Ketones, ur: NEGATIVE mg/dL
Nitrite: NEGATIVE
Protein, ur: NEGATIVE mg/dL
Specific Gravity, Urine: 1.008 (ref 1.005–1.030)
pH: 5 (ref 5.0–8.0)

## 2016-09-30 LAB — COMPREHENSIVE METABOLIC PANEL
ALT: 98 U/L — ABNORMAL HIGH (ref 14–54)
AST: 70 U/L — ABNORMAL HIGH (ref 15–41)
Albumin: 4 g/dL (ref 3.5–5.0)
Alkaline Phosphatase: 83 U/L (ref 38–126)
Anion gap: 10 (ref 5–15)
BUN: 17 mg/dL (ref 6–20)
CO2: 25 mmol/L (ref 22–32)
Calcium: 9.1 mg/dL (ref 8.9–10.3)
Chloride: 106 mmol/L (ref 101–111)
Creatinine, Ser: 0.72 mg/dL (ref 0.44–1.00)
GFR calc Af Amer: >60 mL/min (ref >60)
GFR calc non Af Amer: >60 mL/min (ref >60)
Glucose, Bld: 98 mg/dL (ref 65–99)
Potassium: 3.9 mmol/L (ref 3.5–5.1)
Sodium: 141 mmol/L (ref 135–145)
Total Bilirubin: 0.4 mg/dL (ref 0.3–1.2)
Total Protein: 7.5 g/dL (ref 6.5–8.1)

## 2016-09-30 LAB — CBC
HCT: 37.7 % (ref 36.0–46.0)
Hemoglobin: 13 g/dL (ref 12.0–15.0)
MCH: 34 pg (ref 26.0–34.0)
MCHC: 34.5 g/dL (ref 30.0–36.0)
MCV: 98.7 fL (ref 78.0–100.0)
Platelets: 338 10*3/uL (ref 150–400)
RBC: 3.82 MIL/uL — ABNORMAL LOW (ref 3.87–5.11)
RDW: 11.9 % (ref 11.5–15.5)
WBC: 6 10*3/uL (ref 4.0–10.5)

## 2016-09-30 LAB — LIPASE, BLOOD: Lipase: 22 U/L (ref 11–51)

## 2016-09-30 MED ORDER — LOPERAMIDE HCL 2 MG PO CAPS: 2.0000 mg | ORAL_CAPSULE | Freq: Four times a day (QID) | ORAL | 0 refills | Status: DC | PRN

## 2016-09-30 MED ORDER — ONDANSETRON 8 MG PO TBDP: 8.0000 mg | ORAL_TABLET | Freq: Three times a day (TID) | ORAL | 0 refills | Status: DC | PRN

## 2016-09-30 MED ORDER — SODIUM CHLORIDE 0.9 % IV BOLUS (SEPSIS)
1000.0000 mL | Freq: Once | INTRAVENOUS | Status: AC
  Administered 2016-09-30: 1000 mL via INTRAVENOUS

## 2016-09-30 MED ORDER — LOPERAMIDE HCL 2 MG PO CAPS
4.0000 mg | ORAL_CAPSULE | Freq: Once | ORAL | Status: AC
  Administered 2016-09-30: 4 mg via ORAL
  Filled 2016-09-30: qty 2

## 2016-09-30 MED ORDER — DICYCLOMINE HCL 20 MG PO TABS: 20.0000 mg | ORAL_TABLET | Freq: Two times a day (BID) | ORAL | 0 refills | Status: DC

## 2016-09-30 NOTE — ED Provider Notes (Signed)
[facility] DEPT Provider Note   CSN: [id number] Arrival date & time: 09/30/16  0701     History   Chief Complaint Chief Complaint  Patient presents with  . Abdominal Pain  . Diarrhea    HPI Cathy Baxter is a 56 y.o. female.  HPI Patient presents to the emergency room for evaluation of abdominal pain associated with nausea and diarrhea. Patient states symptoms started a couple days ago. She started having nausea and frequent loose stools. Patient states she's had greater than 20 episodes of diarrhea.  She has diffuse abdominal cramping has not had vomiting. No dysuria. No blood in her stool.  Patient states she has had this problem off and on for several years.  She saw a GI doctor in the past. Denies any history of colitis or diverticulitis. She has not been diagnosed with irritable bowel syndrome. Family members have tested positive for celiac disease but she thinks she had negative testing in the past Past Medical History:  Diagnosis Date  . Allergy   . Anemia   . Arthritis   . Asthma   . Chicken pox   . GERD (gastroesophageal reflux disease)   . Hiatal hernia   . Pigmentary glaucoma of both eyes   . Polyp of stomach   . Shingles   . Thyroid disease   . Urinary incontinence   . Uterine fibroid     Patient Active Problem List   Diagnosis Date Noted  . BMI 34.0-34.9,adult 02/20/2016  . Cataract of both eyes 02/20/2016  . Pigmentary glaucoma of both eyes   . Hiatal hernia 11/06/2015  . Hyperlipidemia 11/06/2015  . Asthma 11/06/2015  . Uterine fibroids in pregnancy, postpartum condition 11/06/2015  . Hypothyroidism 10/17/2015    Past Surgical History:  Procedure Laterality Date  . CHOLECYSTECTOMY  2002  . KNEE ARTHROSCOPY Left 08/2015  . NASAL SINUS SURGERY  2000    OB History    Gravida Para Term Preterm AB Living   3 3 3     2    SAB TAB Ectopic Multiple Live Births                   Home Medications    Prior to Admission medications     Medication Sig Start Date End Date Taking? Authorizing Provider  dorzolamide-timolol (COSOPT) 22.3-6.8 MG/ML ophthalmic solution Place 1 drop into both eyes 2 (two) times daily.  09/18/15  Yes [provider]  fexofenadine (ALLEGRA) 180 MG tablet Take 180 mg by mouth daily.   Yes [provider]  latanoprost (XALATAN) 0.005 % ophthalmic solution Place 1 drop into both eyes at bedtime.  05/24/16  Yes [provider]  levothyroxine (SYNTHROID, LEVOTHROID) 25 MCG tablet Take 1 tablet (25 mcg total) by mouth daily before breakfast. 09/06/16  Yes Kuneff, Renee A, DO  pantoprazole (PROTONIX) 40 MG tablet Take 40 mg by mouth 2 (two) times daily as needed (for acid reflex).    Yes [provider]  dicyclomine (BENTYL) 20 MG tablet Take 1 tablet (20 mg total) by mouth 2 (two) times daily. 09/30/16   Dorie Rank, MD  fluticasone (FLONASE) 50 MCG/ACT nasal spray Place 2 sprays into both nostrils daily. Patient not taking: Reported on 09/30/2016 05/09/15   Brayton Caves, PA-C  loperamide (IMODIUM) 2 MG capsule Take 1 capsule (2 mg total) by mouth 4 (four) times daily as needed for diarrhea or loose stools. 09/30/16   Dorie Rank, MD  loratadine (CLARITIN) 10 MG tablet  Take 1 tablet (10 mg total) by mouth daily. Patient not taking: Reported on 09/30/2016 05/31/16   Dorie Rank, MD  ondansetron (ZOFRAN ODT) 8 MG disintegrating tablet Take 1 tablet (8 mg total) by mouth every 8 (eight) hours as needed for nausea or vomiting. 09/30/16   Dorie Rank, MD    Family History Family History  Problem Relation Age of Onset  . Heart disease Mother   . Early death Father   . Cancer Son 40       Neuroepithelioma     Social History Social History  Substance Use Topics  . Smoking status: Former Smoker    Types: Cigarettes  . Smokeless tobacco: Never Used  . Alcohol use No     Allergies   Percocet [oxycodone-acetaminophen]; Amoxicillin; and Demerol [meperidine]   Review of  Systems Review of Systems  All other systems reviewed and are negative.    Physical Exam Updated Vital Signs BP (!) 128/57   Pulse 66   Temp 97.6 F (36.4 C) (Oral)   Resp 18   Ht 1.575 m (5\' 2" )   Wt 85.3 kg (188 lb 1.6 oz)   SpO2 100%   BMI 34.40 kg/m   Physical Exam  Constitutional: She appears well-developed and well-nourished. No distress.  HENT:  Head: Normocephalic and atraumatic.  Right Ear: External ear normal.  Left Ear: External ear normal.  Eyes: Conjunctivae are normal. Right eye exhibits no discharge. Left eye exhibits no discharge. No scleral icterus.  Neck: Neck supple. No tracheal deviation present.  Cardiovascular: Normal rate, regular rhythm and intact distal pulses.   Pulmonary/Chest: Effort normal and breath sounds normal. No stridor. No respiratory distress. She has no wheezes. She has no rales.  Abdominal: Soft. Bowel sounds are normal. She exhibits no distension. There is no tenderness. There is no rebound and no guarding.  Musculoskeletal: She exhibits no edema or tenderness.  Neurological: She is alert. She has normal strength. No cranial nerve deficit (no facial droop, extraocular movements intact, no slurred speech) or sensory deficit. She exhibits normal muscle tone. She displays no seizure activity. Coordination normal.  Skin: Skin is warm and dry. No rash noted.  Psychiatric: She has a normal mood and affect.  Nursing note and vitals reviewed.    ED Treatments / Results  Labs (all labs ordered are listed, but only abnormal results are displayed) Labs Reviewed  COMPREHENSIVE METABOLIC PANEL - Abnormal; Notable for the following:       Result Value   AST 70 (*)    ALT 98 (*)    All other components within normal limits  CBC - Abnormal; Notable for the following:    RBC 3.82 (*)    All other components within normal limits  URINALYSIS, ROUTINE W REFLEX MICROSCOPIC - Abnormal; Notable for the following:    APPearance HAZY (*)     Leukocytes, UA TRACE (*)    Bacteria, UA RARE (*)    Squamous Epithelial / LPF 0-5 (*)    All other components within normal limits  LIPASE, BLOOD    Procedures Procedures (including critical care time)  Medications Ordered in ED Medications  sodium chloride 0.9 % bolus 1,000 mL (0 mLs Intravenous Stopped 09/30/16 1157)  loperamide (IMODIUM) capsule 4 mg (4 mg Oral Given 09/30/16 0920)     Initial Impression / Assessment and Plan / ED Course  I have reviewed the triage vital signs and the nursing notes.  Pertinent labs & imaging results that were available  during my care of the patient were reviewed by me and considered in my medical decision making (see chart for details).   laboratory results are reassuring. Mild increase in LFTs noted butI doubt that these are clinically significant. I doubt acute infection or other emergency condition.Symptoms may be related to irritable bowel syndrome considering the chronic recurrent nature of her symptoms.  Patient states she has seen a GI doctor in the past but has been a while.I will give her prescription for Bentyl, Imodium and Zofran. I recommend follow-up with primary care doctor  Final Clinical Impressions(s) / ED Diagnoses   Final diagnoses:  Diarrhea, unspecified type  Abdominal pain, unspecified abdominal location    New Prescriptions New Prescriptions   DICYCLOMINE (BENTYL) 20 MG TABLET    Take 1 tablet (20 mg total) by mouth 2 (two) times daily.   LOPERAMIDE (IMODIUM) 2 MG CAPSULE    Take 1 capsule (2 mg total) by mouth 4 (four) times daily as needed for diarrhea or loose stools.   ONDANSETRON (ZOFRAN ODT) 8 MG DISINTEGRATING TABLET    Take 1 tablet (8 mg total) by mouth every 8 (eight) hours as needed for nausea or vomiting.     Dorie Rank, MD 09/30/16 [phone]

## 2016-09-30 NOTE — ED Notes (Signed)
Pt c/o pain in the lower right area of the back that radiates to the front. Also, c/o n/v/d for 2 days.

## 2016-09-30 NOTE — Discharge Instructions (Signed)
Follow up with your primary care doctor and consider seeing a GI doctor, take the medications as needed, return for fever, worsening symptoms

## 2016-09-30 NOTE — ED Triage Notes (Addendum)
Pt presents with RUQ pain that radiates to right back and diarrhea x 3 days. Denies N/V.

## 2016-11-29 ENCOUNTER — Other Ambulatory Visit: Payer: BLUE CROSS/BLUE SHIELD

## 2017-01-15 IMAGING — MR MR KNEE*L* W/O CM
4 of 6 series · 20 of 40 positions shown · non-contrast
Comparison: Radiographs 07/13/2015.

CLINICAL DATA: Fell at work on 07/13/2015 and twisted knee.
Persistent pain.

EXAM:
MRI OF THE LEFT KNEE WITHOUT CONTRAST
TECHNIQUE: Multiplanar, multisequence MR imaging of the knee was performed. No
intravenous contrast was administered.

[Series 4: pd_tse_fs_tra · axial · 4.0mm · 0.42mm/px · z∈[-36,+34]mm · 3 of 24 slices shown]
[im 4/24]
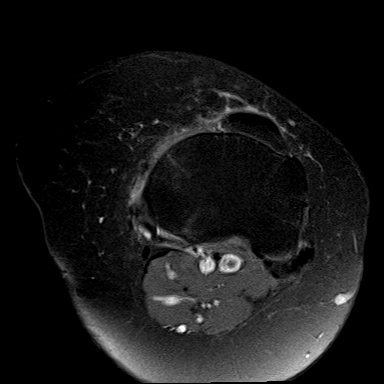
[im 14/24]
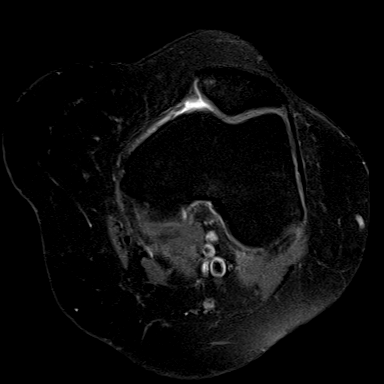
[im 20/24]
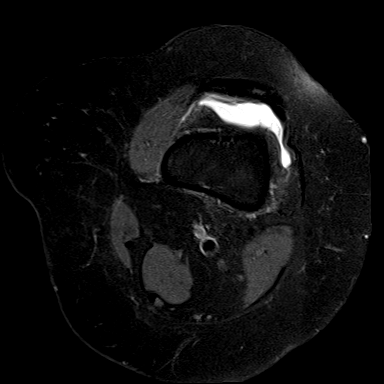

[Series 6: T2 fat-sat · coronal · 3.2mm · 0.62mm/px · 7 of 20 slices shown]
[im 1/20]
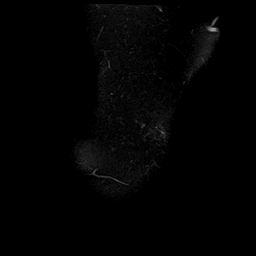
[im 4/20]
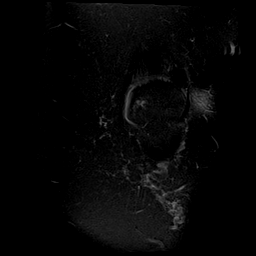
[im 7/20]
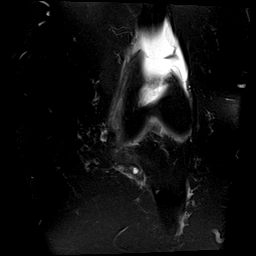
[im 10/20]
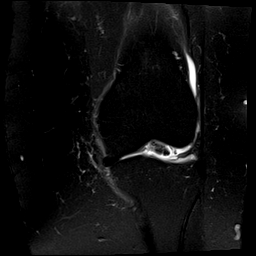
[im 13/20]
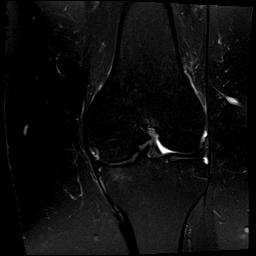
[im 16/20]
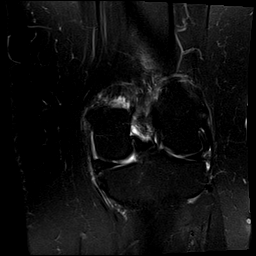
[im 20/20]
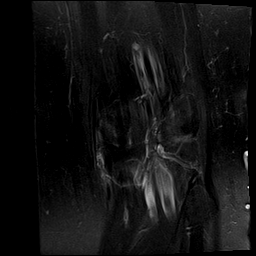

[Series 7: T1 · coronal · 3.2mm · 0.25mm/px · 3 of 20 slices shown]
[im 4/20]
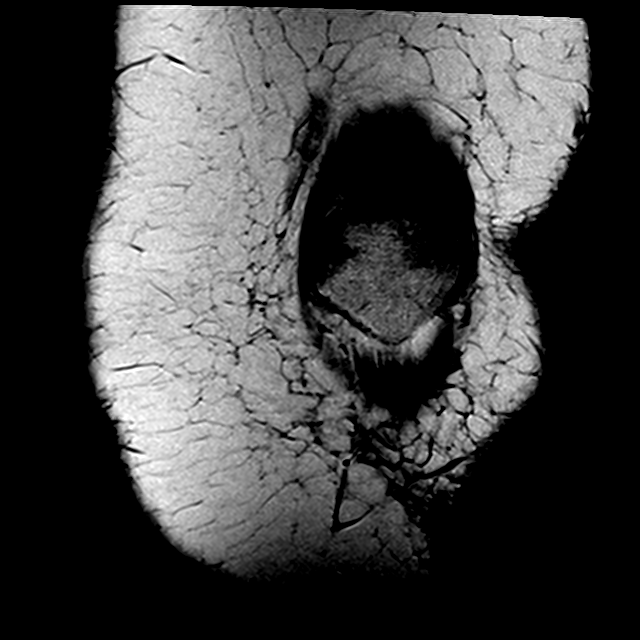
[im 10/20]
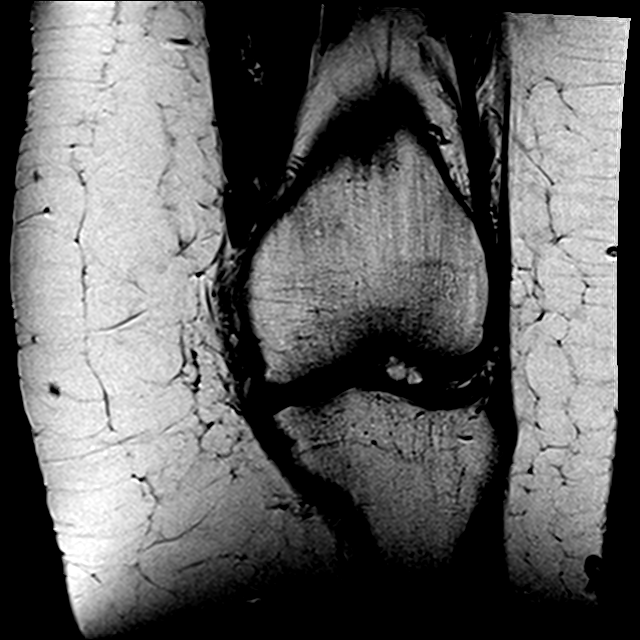
[im 16/20]
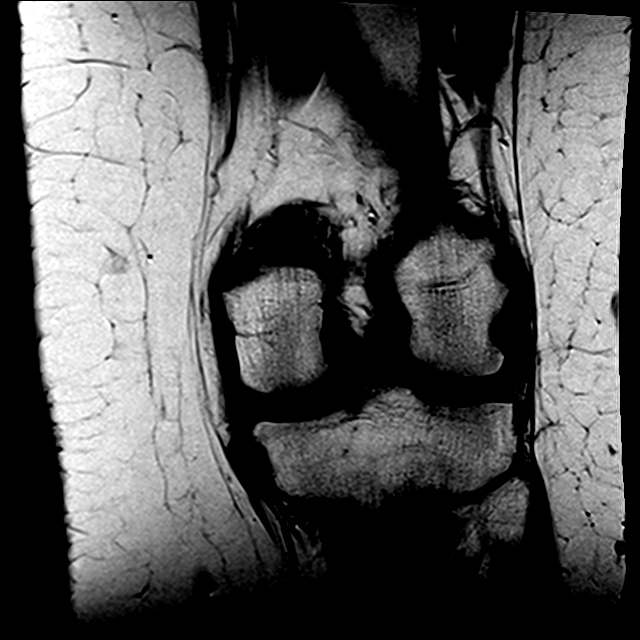

[Series 8: PD fat-sat · sagittal · 3.5mm · 0.25mm/px · 7 of 21 slices shown]
[im 1/21]
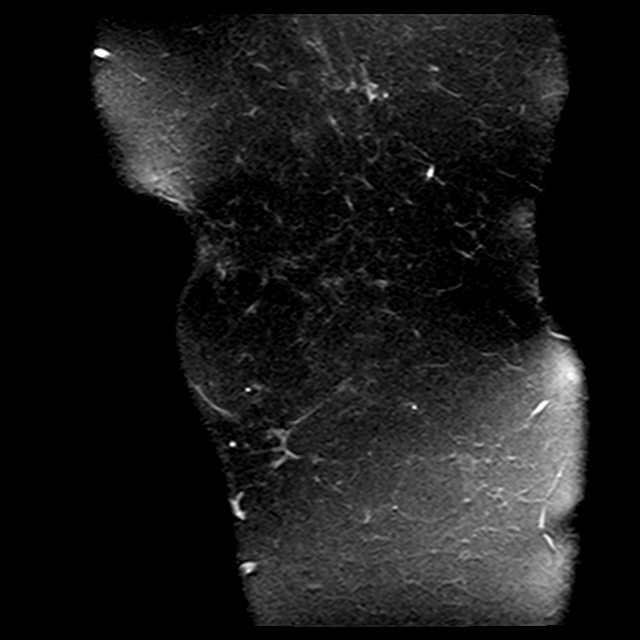
[im 4/21]
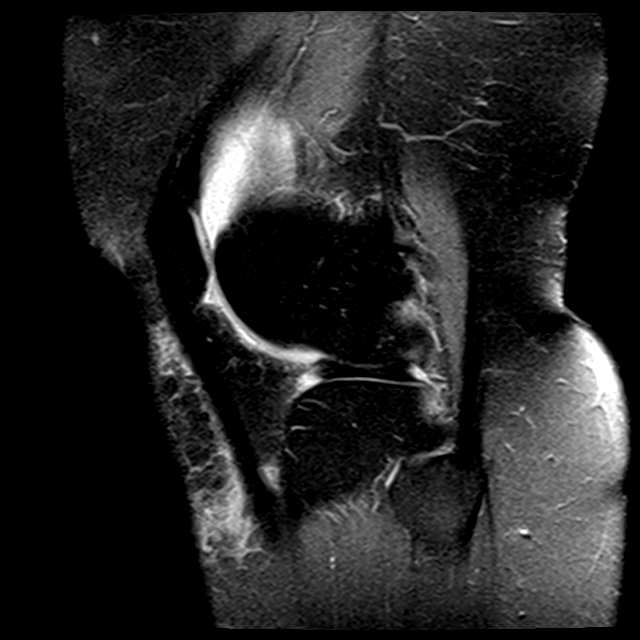
[im 7/21]
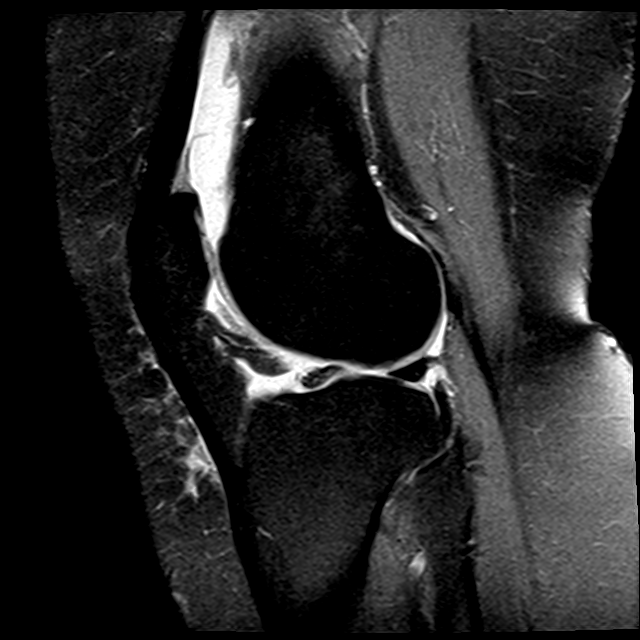
[im 11/21]
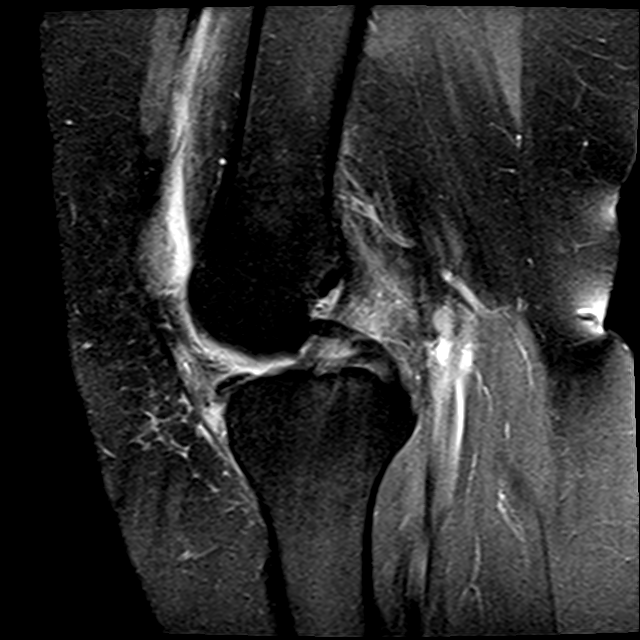
[im 14/21]
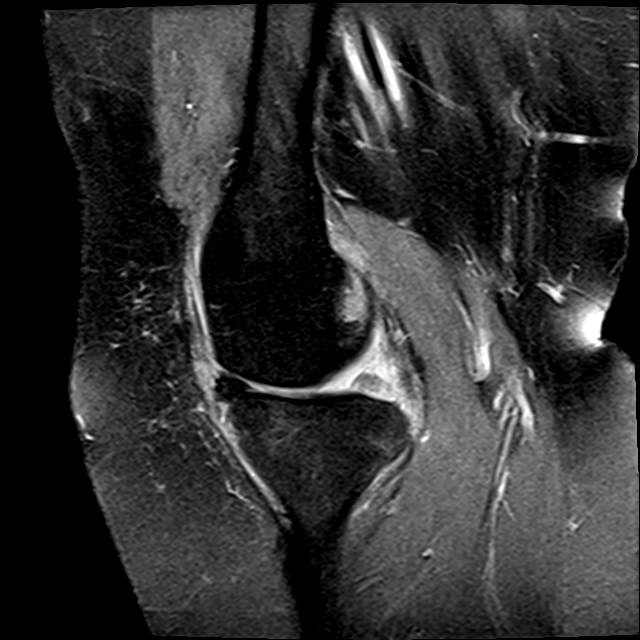
[im 17/21]
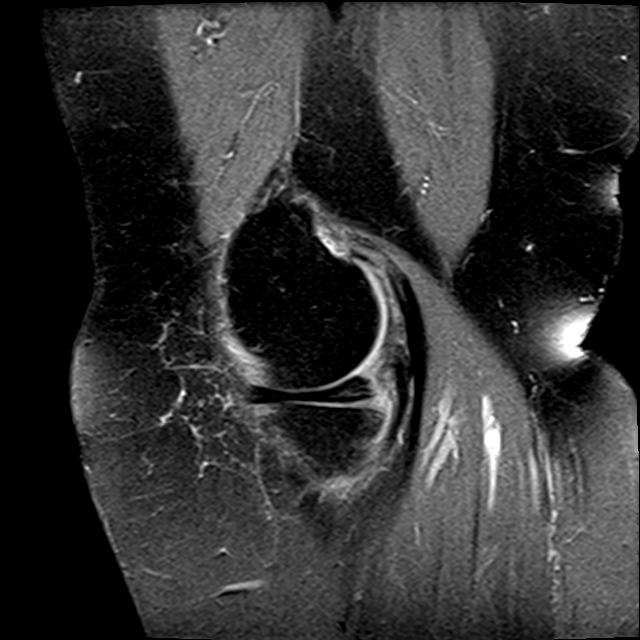
[im 21/21]
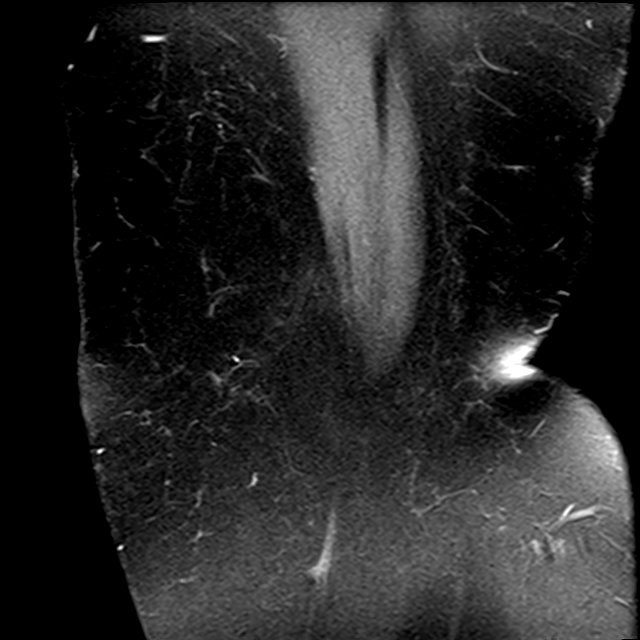

[20 of 40 positions shown; findings below may reference images not displayed]

FINDINGS: MENISCI

Medial meniscus: There is a radial tear involving the posterior horn
at the meniscal root with detachment and separation of approximately
5 mm. There is associated medial protrusion of the meniscus which
demonstrates increased intrasubstance T2 signal intensity.

Lateral meniscus:  Mild degenerative changes but no discrete tear.

LIGAMENTS

Cruciates:  Intact

Collaterals:  Intact

CARTILAGE

Patellofemoral:  Mild degenerative chondrosis/ chondromalacia.

Medial: Moderate degenerative chondrosis/chondromalacia with joint
space narrowing and osteophytic spurring.

Lateral:  Mild degenerative chondrosis.  Early osteophytic spurring.

Joint:  Moderate-sized joint effusion and mild synovitis.

Popliteal Fossa:  No popliteal mass or Baker's cyst.

Extensor Mechanism: The patella retinacular structures are intact
and the quadriceps and patellar tendons are intact.

Bones: No acute bony findings. Mild stress edema in the medial
tibial plateau.

Other: None
IMPRESSION: 1. Radial tear involving the posterior horn of the medial meniscus
at the meniscal root with detachment and medial protrusion of the
meniscus which is likely dysfunctional.
2. Intact ligamentous structures and no acute bony findings.
3. Tricompartmental degenerative chondrosis/chondromalacia most
significant in the medial compartment.
4. Moderate-sized joint effusion and mild synovitis.

## 2017-02-08 ENCOUNTER — Ambulatory Visit (INDEPENDENT_AMBULATORY_CARE_PROVIDER_SITE_OTHER): Payer: BLUE CROSS/BLUE SHIELD | Admitting: Family Medicine

## 2017-02-08 ENCOUNTER — Encounter: Payer: Self-pay | Admitting: Family Medicine

## 2017-02-08 VITALS — BP 108/70 | HR 84 | Temp 97.6°F | Resp 20 | Wt 200.0 lb

## 2017-02-08 DIAGNOSIS — J321 Chronic frontal sinusitis: Secondary | ICD-10-CM | POA: Insufficient documentation

## 2017-02-08 DIAGNOSIS — J019 Acute sinusitis, unspecified: Secondary | ICD-10-CM

## 2017-02-08 DIAGNOSIS — E079 Disorder of thyroid, unspecified: Secondary | ICD-10-CM | POA: Diagnosis not present

## 2017-02-08 MED ORDER — AZITHROMYCIN 250 MG PO TABS: ORAL_TABLET | ORAL | 0 refills | Status: DC

## 2017-02-08 NOTE — Progress Notes (Signed)
Cathy Baxter , 1961/01/05, 57 y.o., female MRN: [id number] Patient Care Team    Relationship Specialty Notifications Start End  Ma Hillock, DO PCP - General Family Medicine  08/30/16   Philemon Kingdom, MD Consulting Physician Internal Medicine  02/20/16    Comment: endocrine  Linda Hedges, DO Consulting Physician Obstetrics and Gynecology  02/20/16     Chief Complaint  Patient presents with  . Hypothyroidism  . Headache    facial pressure, congestion     Subjective:   Thyroid: patient reports she continues to gain weight ever since endocrine lowered her thyroid replacement to 25 g. She reports she is not exercising routinely. She does not watch calories in her diet. She denies bowel changes. She denies increase in fatigue.  Sinus: patient complains of facial pressure and congestion since her sinus surgery in 2000. She reports continued dark green mucus production. Facial pressure over her frontal sinus. She denies fever, chills, nausea, vomit or ear pressure. She is not routinely taking be antihistamine or nasal sprays. She does use the nasal saline rinse daily. Patient was treated a few months ago for sinus infection with doxycycline, and reports she did not see any improvement. Patient has severe glaucoma and undergoing surgery tomorrow.   Depression screen PHQ 2/9 02/20/2016  Decreased Interest 0  Down, Depressed, Hopeless 0  PHQ - 2 Score 0    Allergies  Allergen Reactions  . Percocet [Oxycodone-Acetaminophen] Shortness Of Breath  . Amoxicillin Hives    Has patient had a PCN reaction causing immediate rash, facial/tongue/throat swelling, SOB or lightheadedness with hypotension: Yes Has patient had a PCN reaction causing severe rash involving mucus membranes or skin necrosis: No Has patient had a PCN reaction that required hospitalization No Has patient had a PCN reaction occurring within the last 10 years: Yes  If all of the above answers are "NO", then may proceed  with Cephalosporin use.   Marland Kitchen Oxycodone   . Demerol [Meperidine] Palpitations   Social History   Tobacco Use  . Smoking status: Former Smoker    Types: Cigarettes  . Smokeless tobacco: Never Used  Substance Use Topics  . Alcohol use: No   Past Medical History:  Diagnosis Date  . Allergy   . Anemia   . Arthritis   . Asthma   . Chicken pox   . GERD (gastroesophageal reflux disease)   . Hiatal hernia   . Pigmentary glaucoma of both eyes   . Polyp of stomach   . Shingles   . Thyroid disease   . Urinary incontinence   . Uterine fibroid    Past Surgical History:  Procedure Laterality Date  . CHOLECYSTECTOMY  2002  . KNEE ARTHROSCOPY Left 08/2015  . NASAL SINUS SURGERY  2000   Family History  Problem Relation Age of Onset  . Heart disease Mother   . Early death Father   . Cancer Son 10       Neuroepithelioma    Allergies as of 02/08/2017      Reactions   Percocet [oxycodone-acetaminophen] Shortness Of Breath   Amoxicillin Hives   Has patient had a PCN reaction causing immediate rash, facial/tongue/throat swelling, SOB or lightheadedness with hypotension: Yes Has patient had a PCN reaction causing severe rash involving mucus membranes or skin necrosis: No Has patient had a PCN reaction that required hospitalization No Has patient had a PCN reaction occurring within the last 10 years: Yes  If all of the above answers are "NO",  then may proceed with Cephalosporin use.   Oxycodone    Demerol [meperidine] Palpitations      Medication List        Accurate as of 02/08/17  2:59 PM. Always use your most recent med list.          dicyclomine 20 MG tablet Commonly known as:  BENTYL Take 1 tablet (20 mg total) by mouth 2 (two) times daily.   dorzolamide-timolol 22.3-6.8 MG/ML ophthalmic solution Commonly known as:  COSOPT Place 1 drop into both eyes 2 (two) times daily.   fexofenadine 180 MG tablet Commonly known as:  ALLEGRA Take 180 mg by mouth daily.     fluticasone 50 MCG/ACT nasal spray Commonly known as:  FLONASE Place 2 sprays into both nostrils daily.   latanoprost 0.005 % ophthalmic solution Commonly known as:  XALATAN Place 1 drop into both eyes at bedtime.   levothyroxine 25 MCG tablet Commonly known as:  SYNTHROID, LEVOTHROID Take 1 tablet (25 mcg total) by mouth daily before breakfast.   loperamide 2 MG capsule Commonly known as:  IMODIUM Take 1 capsule (2 mg total) by mouth 4 (four) times daily as needed for diarrhea or loose stools.   loratadine 10 MG tablet Commonly known as:  CLARITIN Take 1 tablet (10 mg total) by mouth daily.   pantoprazole 40 MG tablet Commonly known as:  PROTONIX Take 40 mg by mouth 2 (two) times daily as needed (for acid reflex).       All past medical history, surgical history, allergies, family history, immunizations andmedications were updated in the EMR today and reviewed under the history and medication portions of their EMR.     ROS: Negative, with the exception of above mentioned in HPI   Objective:  BP 108/70 (BP Location: Left Arm, Patient Position: Sitting, Cuff Size: Large)   Pulse 84   Temp 97.6 F (36.4 C)   Resp 20   Wt 200 lb (90.7 kg)   SpO2 96%   BMI 36.58 kg/m  Body mass index is 36.58 kg/m. Gen: Afebrile. No acute distress. Nontoxic in appearance, well developed, well nourished.  HENT: AT. [place]. Bilateral TM visualized without erythema, mild fullness bilaterally. MMM, no oral lesions. Bilateral nares very mild erythema, mild drainage, no swelling. Throat without erythema or exudates. No postnasal drip, no cough. No hoarseness. Eyes:Pupils Equal Round Reactive to light, Extraocular movements intact,  Conjunctiva without redness, discharge or icterus. Neck/lymp/endocrine: Supple,no lymphadenopathy, no thyromegaly CV: RRR  Chest: CTAB, no wheeze or crackles. Good air movement, normal resp effort.   No exam data present No results found. No results found for this  or any previous visit (from the past 24 hour(s)).  Assessment/Plan: Rusti Arizmendi is a 57 y.o. female present for OV for  Acute/chronic sinusitis, recurrence not specified, unspecified location Uncertain if her symptoms are actually related to her sinuses versus glaucoma pressure.Patient is undergoing surgery tomorrow. - agreed to Z-Pak today. Continue antihistamines. - referral to ENT for further evaluation of her sinus cavity given this has been chronic for approximately 19 years.  Thyroid disorder/weight gain Agreed to recheck thyroid level today. If normal medication will be unchanged, and patient will be offered referral to weight management. - TSH  Reviewed expectations re: course of current medical issues.  Discussed self-management of symptoms.  Outlined signs and symptoms indicating need for more acute intervention.  Patient verbalized understanding and all questions were answered.  Patient received an After-Visit Summary.   > 25 minutes spent  with patient, >50% of time spent face to face counseling and coordinating care.     No orders of the defined types were placed in this encounter.    Note is dictated utilizing voice recognition software. Although note has been proof read prior to signing, occasional typographical errors still can be missed. If any questions arise, please do not hesitate to call for verification.   electronically signed by:  Howard Pouch, DO  [place]

## 2017-02-08 NOTE — Patient Instructions (Addendum)
Change out your filters. Use a humidifier.  Restart Flonase and allegra/zyrtec.  Z-pack prescribed.  ENT referral placed for chronic sinus. Glaucoma makes it difficult for [place] to give steroids.   We will call you with results once available and guide you  On weight loss at that time after ruling out thyroid as the issue.    Please help [place] help you:  We are honored you have chosen [facility] for your Primary Care home. Below you will find basic instructions that you may need to access in the future. Please help [place] help you by reading the instructions, which cover many of the frequent questions we experience.   Prescription refills and request:  -In order to allow more efficient response time, please call your pharmacy for all refills. They will forward the request electronically to [place]. This allows for the quickest possible response. Request left on a nurse line can take longer to refill, since these are checked as time allows between office patients and other phone calls.  - refill request can take up to 3-5 working days to complete.  - If request is sent electronically and request is appropiate, it is usually completed in 1-2 business days.  - all patients will need to be seen routinely for all chronic medical conditions requiring prescription medications (see follow-up below). If you are overdue for follow up on your condition, you will be asked to make an appointment and we will call in enough medication to cover you until your appointment (up to 30 days).  - all controlled substances will require a face to face visit to request/refill.  - if you desire your prescriptions to go through a new pharmacy, and have an active script at original pharmacy, you will need to call your pharmacy and have scripts transferred to new pharmacy. This is completed between the pharmacy locations and not by your provider.    Results: If any images or labs were ordered, it can take up to 1 week to get  results depending on the test ordered and the lab/facility running and resulting the test. - Normal or stable results, which do not need further discussion, may be released to your mychart immediately with attached note to you. A call may not be generated for normal results. Please make certain to sign up for mychart. If you have questions on how to activate your mychart you can call the front office.  - If your results need further discussion, our office will attempt to contact you via phone, and if unable to reach you after 2 attempts, we will release your abnormal result to your mychart with instructions.  - All results will be automatically released in mychart after 1 week.  - Your provider will provide you with explanation and instruction on all relevant material in your results. Please keep in mind, results and labs may appear confusing or abnormal to the untrained eye, but it does not mean they are actually abnormal for you personally. If you have any questions about your results that are not covered, or you desire more detailed explanation than what was provided, you should make an appointment with your provider to do so.   Our office handles many outgoing and incoming calls daily. If we have not contacted you within 1 week about your results, please check your mychart to see if there is a message first and if not, then contact our office.  In helping with this matter, you help decrease call volume, and therefore  allow [place] to be able to respond to patients needs more efficiently.   Acute office visits (sick visit):  An acute visit is intended for a new problem and are scheduled in shorter time slots to allow schedule openings for patients with new problems. This is the appropriate visit to discuss a new problem. In order to provide you with excellent quality medical care with proper time for you to explain your problem, have an exam and receive treatment with instructions, these appointments should  be limited to one new problem per visit. If you experience a new problem, in which you desire to be addressed, please make an acute office visit, we save openings on the schedule to accommodate you. Please do not save your new problem for any other type of visit, let us take care of it properly and quickly for you.   Follow up visits:  Depending on your condition(s) your provider will need to see you routinely in order to provide you with quality care and prescribe medication(s). Most chronic conditions (Example: hypertension, Diabetes, depression/anxiety... etc), require visits a couple times a year. Your provider will instruct you on proper follow up for your personal medical conditions and history. Please make certain to make follow up appointments for your condition as instructed. Failing to do so could result in lapse in your medication treatment/refills. If you request a refill, and are overdue to be seen on a condition, we will always provide you with a 30 day script (once) to allow you time to schedule.    Medicare wellness (well visit): - we have a wonderful Nurse Maudie Mercury), that will meet with you and provide you will yearly medicare wellness visits. These visits should occur yearly (can not be scheduled less than 1 calendar year apart) and cover preventive health, immunizations, advance directives and screenings you are entitled to yearly through your medicare benefits. Do not miss out on your entitled benefits, this is when medicare will pay for these benefits to be ordered for you.  These are strongly encouraged by your provider and is the appropriate type of visit to make certain you are up to date with all preventive health benefits. If you have not had your medicare wellness exam in the last 12 months, please make certain to schedule one by calling the office and schedule your medicare wellness with Maudie Mercury as soon as possible.   Yearly physical (well visit):  - Adults are recommended to be seen  yearly for physicals. Check with your insurance and date of your last physical, most insurances require one calendar year between physicals. Physicals include all preventive health topics, screenings, medical exam and labs that are appropriate for gender/age and history. You may have fasting labs needed at this visit. This is a well visit (not a sick visit), new problems should not be covered during this visit (see acute visit).  - Pediatric patients are seen more frequently when they are younger. Your provider will advise you on well child visit timing that is appropriate for your their age. - This is not a medicare wellness visit. Medicare wellness exams do not have an exam portion to the visit. Some medicare companies allow for a physical, some do not allow a yearly physical. If your medicare allows a yearly physical you can schedule the medicare wellness with our nurse Maudie Mercury and have your physical with your provider after, on the same day. Please check with insurance for your full benefits.   Late Policy/No Shows:  - all new  patients should arrive 15-30 minutes earlier than appointment to allow [place] time  to  obtain all personal demographics,  insurance information and for you to complete office paperwork. - All established patients should arrive 10-15 minutes earlier than appointment time to update all information and be checked in .  - In our best efforts to run on time, if you are late for your appointment you will be asked to either reschedule or if able, we will work you back into the schedule. There will be a wait time to work you back in the schedule,  depending on availability.  - If you are unable to make it to your appointment as scheduled, please call 24 hours ahead of time to allow [place] to fill the time slot with someone else who needs to be seen. If you do not cancel your appointment ahead of time, you may be charged a no show fee.

## 2017-02-09 LAB — TSH: TSH: 1.49 u[IU]/mL (ref 0.35–4.50)

## 2017-02-10 ENCOUNTER — Other Ambulatory Visit: Payer: Self-pay | Admitting: *Deleted

## 2017-02-10 ENCOUNTER — Telehealth: Payer: Self-pay | Admitting: Family Medicine

## 2017-02-10 DIAGNOSIS — R635 Abnormal weight gain: Secondary | ICD-10-CM

## 2017-02-10 DIAGNOSIS — E669 Obesity, unspecified: Secondary | ICD-10-CM

## 2017-02-10 MED ORDER — LEVOTHYROXINE SODIUM 25 MCG PO TABS: 25.0000 ug | ORAL_TABLET | Freq: Every day | ORAL | 3 refills | Status: DC

## 2017-02-10 NOTE — Telephone Encounter (Signed)
Referral placed for weight management.

## 2017-02-10 NOTE — Telephone Encounter (Signed)
-----   Message from Cathy Jacobsen, LPN sent at 7/57/9728  9:53 AM EST ----- Spoke with patient reviewed lab results and instructions. Patient verbalized understanding. Patient would like a referral for weight management.

## 2017-03-01 ENCOUNTER — Ambulatory Visit: Payer: BLUE CROSS/BLUE SHIELD | Admitting: Skilled Nursing Facility1

## 2017-03-17 ENCOUNTER — Ambulatory Visit: Payer: BLUE CROSS/BLUE SHIELD | Admitting: Dietician

## 2017-05-30 ENCOUNTER — Ambulatory Visit: Payer: BLUE CROSS/BLUE SHIELD | Admitting: Internal Medicine

## 2017-06-01 ENCOUNTER — Encounter: Payer: Self-pay | Admitting: Family Medicine

## 2017-06-01 ENCOUNTER — Ambulatory Visit (INDEPENDENT_AMBULATORY_CARE_PROVIDER_SITE_OTHER): Payer: BLUE CROSS/BLUE SHIELD | Admitting: Family Medicine

## 2017-06-01 ENCOUNTER — Ambulatory Visit (HOSPITAL_BASED_OUTPATIENT_CLINIC_OR_DEPARTMENT_OTHER)
Admission: RE | Admit: 2017-06-01 | Discharge: 2017-06-01 | Disposition: A | Discharge status: Home / Self Care | Payer: BLUE CROSS/BLUE SHIELD | Source: Ambulatory Visit | Attending: Family Medicine | Admitting: Family Medicine

## 2017-06-01 VITALS — BP 130/72 | HR 110 | Temp 98.2°F | Resp 20 | Ht 62.0 in | Wt 198.0 lb

## 2017-06-01 DIAGNOSIS — R05 Cough: Secondary | ICD-10-CM | POA: Diagnosis present

## 2017-06-01 DIAGNOSIS — R000 Tachycardia, unspecified: Secondary | ICD-10-CM | POA: Diagnosis present

## 2017-06-01 DIAGNOSIS — J4541 Moderate persistent asthma with (acute) exacerbation: Secondary | ICD-10-CM

## 2017-06-01 DIAGNOSIS — R062 Wheezing: Secondary | ICD-10-CM

## 2017-06-01 MED ORDER — MONTELUKAST SODIUM 10 MG PO TABS: 10.0000 mg | ORAL_TABLET | Freq: Every day | ORAL | 3 refills | Status: DC

## 2017-06-01 MED ORDER — IPRATROPIUM-ALBUTEROL 0.5-2.5 (3) MG/3ML IN SOLN
3.0000 mL | Freq: Once | RESPIRATORY_TRACT | Status: AC
  Administered 2017-06-01: 3 mL via RESPIRATORY_TRACT

## 2017-06-01 MED ORDER — FLUTICASONE-SALMETEROL 100-50 MCG/DOSE IN AEPB: 1.0000 | INHALATION_SPRAY | Freq: Two times a day (BID) | RESPIRATORY_TRACT | 5 refills | Status: DC

## 2017-06-01 MED ORDER — AZITHROMYCIN 250 MG PO TABS: ORAL_TABLET | ORAL | 0 refills | Status: DC

## 2017-06-01 MED ORDER — ALBUTEROL SULFATE HFA 108 (90 BASE) MCG/ACT IN AERS: 1.0000 | INHALATION_SPRAY | Freq: Four times a day (QID) | RESPIRATORY_TRACT | 5 refills | Status: DC | PRN

## 2017-06-01 NOTE — Progress Notes (Signed)
Cathy Baxter , 11-17-1960, 57 y.o., female MRN: [id number] Patient Care Team    Relationship Specialty Notifications Start End  Ma Hillock, DO PCP - General Family Medicine  08/30/16   Philemon Kingdom, MD Consulting Physician Internal Medicine  02/20/16    Comment: endocrine  Linda Hedges, DO Consulting Physician Obstetrics and Gynecology  02/20/16     Chief Complaint  Patient presents with  . URI    cough,wheezing,congestion,feels weak     Subjective: Pt presents for an OV with complaints of cough of 3 days duration.  Associated symptoms include wheezing, congestion, feels weak, short of breath.  Patient had a similar episode in January in which she was prescribed azithromycin and she states she recovered well.  About a month ago she started noticing some more congestion occurring again.  Over the last few days she was at her daughter's house which had cats, and she is allergic to cats.  Has a history of asthma and used to be prescribed Dulera inhaler.  She has not had an inhaler for approximately 5 years.  She is taking Allegra daily.  She is a former smoker.  She has glaucoma of both eyes.   Depression screen [facility] 2/9 06/01/2017 02/20/2016  Decreased Interest 0 0  Down, Depressed, Hopeless 0 0  PHQ - 2 Score 0 0    Allergies  Allergen Reactions  . Percocet [Oxycodone-Acetaminophen] Shortness Of Breath  . Amoxicillin Hives    Has patient had a PCN reaction causing immediate rash, facial/tongue/throat swelling, SOB or lightheadedness with hypotension: Yes Has patient had a PCN reaction causing severe rash involving mucus membranes or skin necrosis: No Has patient had a PCN reaction that required hospitalization No Has patient had a PCN reaction occurring within the last 10 years: Yes  If all of the above answers are "NO", then may proceed with Cephalosporin use.   Marland Kitchen Doxycycline Itching  . Oxycodone   . Demerol [Meperidine] Palpitations   Social History   Tobacco Use  .  Smoking status: Former Smoker    Types: Cigarettes  . Smokeless tobacco: Never Used  Substance Use Topics  . Alcohol use: No   Past Medical History:  Diagnosis Date  . Allergy   . Anemia   . Arthritis   . Asthma   . Chicken pox   . GERD (gastroesophageal reflux disease)   . Hiatal hernia   . Pigmentary glaucoma of both eyes   . Polyp of stomach   . Shingles   . Thyroid disease   . Urinary incontinence   . Uterine fibroid    Past Surgical History:  Procedure Laterality Date  . CHOLECYSTECTOMY  2002  . KNEE ARTHROSCOPY Left 08/2015  . NASAL SINUS SURGERY  2000   Family History  Problem Relation Age of Onset  . Heart disease Mother   . Early death Father   . Cancer Son 10       Neuroepithelioma    Allergies as of 06/01/2017      Reactions   Percocet [oxycodone-acetaminophen] Shortness Of Breath   Amoxicillin Hives   Has patient had a PCN reaction causing immediate rash, facial/tongue/throat swelling, SOB or lightheadedness with hypotension: Yes Has patient had a PCN reaction causing severe rash involving mucus membranes or skin necrosis: No Has patient had a PCN reaction that required hospitalization No Has patient had a PCN reaction occurring within the last 10 years: Yes  If all of the above answers are "NO", then may  proceed with Cephalosporin use.   Doxycycline Itching   Oxycodone    Demerol [meperidine] Palpitations      Medication List        Accurate as of 06/01/17  5:19 PM. Always use your most recent med list.          albuterol 108 (90 Base) MCG/ACT inhaler Commonly known as:  VENTOLIN HFA Inhale 1-2 puffs into the lungs every 6 (six) hours as needed for wheezing or shortness of breath.   azithromycin 250 MG tablet Commonly known as:  ZITHROMAX 500 mg day 1, then 250 mg QD   dorzolamide-timolol 22.3-6.8 MG/ML ophthalmic solution Commonly known as:  COSOPT Place 1 drop into both eyes 2 (two) times daily.   fexofenadine 180 MG tablet Commonly  known as:  ALLEGRA Take 180 mg by mouth daily.   fluticasone 50 MCG/ACT nasal spray Commonly known as:  FLONASE Place 2 sprays into both nostrils daily.   Fluticasone-Salmeterol 100-50 MCG/DOSE Aepb Commonly known as:  ADVAIR Inhale 1 puff into the lungs 2 (two) times daily.   latanoprost 0.005 % ophthalmic solution Commonly known as:  XALATAN Place 1 drop into both eyes at bedtime.   levothyroxine 25 MCG tablet Commonly known as:  SYNTHROID, LEVOTHROID Take 1 tablet (25 mcg total) by mouth daily before breakfast.   montelukast 10 MG tablet Commonly known as:  SINGULAIR Take 1 tablet (10 mg total) by mouth at bedtime.   pantoprazole 40 MG tablet Commonly known as:  PROTONIX Take 40 mg by mouth 2 (two) times daily as needed (for acid reflex).       All past medical history, surgical history, allergies, family history, immunizations andmedications were updated in the EMR today and reviewed under the history and medication portions of their EMR.     ROS: Negative, with the exception of above mentioned in HPI   Objective:  BP 130/72 (BP Location: Right Arm, Patient Position: Sitting, Cuff Size: Large)   Pulse (!) 110   Temp 98.2 F (36.8 C)   Resp 20   Ht 5\' 2"  (1.575 m)   Wt 198 lb (89.8 kg)   SpO2 98%   BMI 36.21 kg/m  Body mass index is 36.21 kg/m. Gen: Afebrile. No acute distress. Nontoxic in appearance, well developed, well nourished.  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  Appears tired. HENT: AT. [place]. Bilateral TM visualized without erythema or bulging. MMM, no oral lesions. Bilateral nares with erythema, no drainage. Throat without erythema or exudates.  Cough present.  Hoarseness present. Eyes:Pupils Equal Round Reactive to light, Extraocular movements intact,  Conjunctiva without redness, discharge or icterus. Neck/lymp/endocrine: Supple, no lymphadenopathy CV: RRR , no edema Chest: Wheezing present.  No crackles.  Diminished breath sounds present, but equal  bilaterally. Neuro:  Normal gait. PERLA. EOMi. Alert. Oriented x3  Psych: Normal affect, dress and demeanor. Normal speech. Normal thought content and judgment.  No exam data present No results found. No results found for this or any previous visit (from the past 24 hour(s)).  Assessment/Plan: Cathy Baxter is a 57 y.o. female present for OV for  Moderate persistent asthma with acute exacerbation/wheezing -Patient's initial exam was significant for diminished air sounds, and pulse ox of 94%.  DuoNeb treatment provided in office today and pulse ox increased to 98%.  Movement improved greatly and patient felt much improved.  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 -Patient was encouraged to start daily antihistamine, nightly Singulair was prescribed.  Advair inhaler to be used daily twice daily.  Albuterol inhaler to be used for rescue inhaler only every 4-6 hours as needed. -Chest x-ray ordered.   azithromycin prescribed. - albuterol (VENTOLIN HFA) 108 (90 Base) MCG/ACT inhaler; Inhale 1-2 puffs into the lungs every 6 (six) hours as needed for wheezing or shortness of breath.  Dispense: 1 Inhaler; Refill: 5 - Fluticasone-Salmeterol (ADVAIR) 100-50 MCG/DOSE AEPB; Inhale 1 puff into the lungs 2 (two) times daily.  Dispense: 1 each; Refill: 5 - montelukast (SINGULAIR) 10 MG tablet; Take 1 tablet (10 mg total) by mouth at bedtime.  Dispense: 90 tablet; Refill: 3 - azithromycin (ZITHROMAX) 250 MG tablet; 500 mg day 1, then 250 mg QD  Dispense: 6 tablet; Refill: 0 - ipratropium-albuterol (DUONEB) 0.5-2.5 (3) MG/3ML nebulizer solution 3 mL - DG Chest 2 View; Future -Patient encouraged to report to the ED if symptoms worsen if after hours or if severe shortness of breath.  Otherwise, follow-up 4 weeks if not improved on daily inhalers.  > 25 minutes spent with patient, >50% of time spent face to face counseling and coordinating care.      Reviewed expectations re: course of current medical issues.  Discussed self-management of symptoms.  Outlined signs and symptoms indicating need for more acute intervention.  Patient verbalized understanding and all questions were answered.  Patient received an After-Visit Summary.    Orders Placed This Encounter  Procedures  . DG Chest 2 View     Note is dictated utilizing voice recognition software. Although note has been proof read prior to signing, occasional typographical errors still can be missed. If any questions arise, please do not hesitate to call for verification.   electronically signed by:  Howard Pouch, DO  [place]

## 2017-06-01 NOTE — Patient Instructions (Signed)
Please have cxr completed today at [facility].  Start Z-pack  DAILY advair (every 12 hours) Albuterol every 4-6 hrs as needed for chest tightness or wheezing  Make sure to take a DAILY antihistamine (allegra or zyrtec or xyzal good). START Singulair BEFORE BED EVERYNIGHT  If worsening, and not responding to inhaler, you need to get treatment.     Asthma, Acute Bronchospasm Acute bronchospasm caused by asthma is also referred to as an asthma attack. Bronchospasm means your air passages become narrowed. The narrowing is caused by inflammation and tightening of the muscles in the air tubes (bronchi) in your lungs. This can make it hard to breathe or cause you to wheeze and cough. What are the causes? Possible triggers are:  Animal dander from the skin, hair, or feathers of animals.  Dust mites contained in house dust.  Cockroaches.  Pollen from trees or grass.  Mold.  Cigarette or tobacco smoke.  Air pollutants such as dust, household cleaners, hair sprays, aerosol sprays, paint fumes, strong chemicals, or strong odors.  Cold air or weather changes. Cold air may trigger inflammation. Winds increase molds and pollens in the air.  Strong emotions such as crying or laughing hard.  Stress.  Certain medicines such as aspirin or beta-blockers.  Sulfites in foods and drinks, such as dried fruits and wine.  Infections or inflammatory conditions, such as a flu, cold, or inflammation of the nasal membranes (rhinitis).  Gastroesophageal reflux disease (GERD). GERD is a condition where stomach acid backs up into your esophagus.  Exercise or strenuous activity.  What are the signs or symptoms?  Wheezing.  Excessive coughing, particularly at night.  Chest tightness.  Shortness of breath. How is this diagnosed? Your health care provider will ask you about your medical history and perform a physical exam. A chest X-ray or blood testing may be performed to look for other causes  of your symptoms or other conditions that may have triggered your asthma attack. How is this treated? Treatment is aimed at reducing inflammation and opening up the airways in your lungs. Most asthma attacks are treated with inhaled medicines. These include quick relief or rescue medicines (such as bronchodilators) and controller medicines (such as inhaled corticosteroids). These medicines are sometimes given through an inhaler or a nebulizer. Systemic steroid medicine taken by mouth or given through an IV tube also can be used to reduce the inflammation when an attack is moderate or severe. Antibiotic medicines are only used if a bacterial infection is present. Follow these instructions at home:  Rest.  Drink plenty of liquids. This helps the mucus to remain thin and be easily coughed up. Only use caffeine in moderation and do not use alcohol until you have recovered from your illness.  Do not smoke. Avoid being exposed to secondhand smoke.  You play a critical role in keeping yourself in good health. Avoid exposure to things that cause you to wheeze or to have breathing problems.  Keep your medicines up-to-date and available. Carefully follow your health care provider's treatment plan.  Take your medicine exactly as prescribed.  When pollen or pollution is bad, keep windows closed and use an air conditioner or go to places with air conditioning.  Asthma requires careful medical care. See your health care provider for a follow-up as advised. If you are more than [redacted] weeks pregnant and you were prescribed any new medicines, let your obstetrician know about the visit and how you are doing. Follow up with your health care  provider as directed.  After you have recovered from your asthma attack, make an appointment with your outpatient doctor to talk about ways to reduce the likelihood of future attacks. If you do not have a doctor who manages your asthma, make an appointment with a primary care  doctor to discuss your asthma. Get help right away if:  You are getting worse.  You have trouble breathing. If severe, call your local emergency services (911 in the [place]  You develop chest pain or discomfort.  You are vomiting.  You are not able to keep fluids down.  You are coughing up yellow, green, brown, or bloody sputum.  You have a fever and your symptoms suddenly get worse.  You have trouble swallowing. This information is not intended to replace advice given to you by your health care provider. Make sure you discuss any questions you have with your health care provider. Document Released: 04/14/2006 Document Revised: 06/11/2015 Document Reviewed: 07/05/2012 Elsevier Interactive Patient Education  2017 [employer].

## 2017-07-11 ENCOUNTER — Emergency Department (HOSPITAL_BASED_OUTPATIENT_CLINIC_OR_DEPARTMENT_OTHER)
Admission: EM | Admit: 2017-07-11 | Discharge: 2017-07-11 | Disposition: A | Discharge status: Home / Self Care | Payer: BLUE CROSS/BLUE SHIELD | Attending: Emergency Medicine | Admitting: Emergency Medicine

## 2017-07-11 ENCOUNTER — Encounter (HOSPITAL_BASED_OUTPATIENT_CLINIC_OR_DEPARTMENT_OTHER): Payer: Self-pay | Admitting: Emergency Medicine

## 2017-07-11 ENCOUNTER — Other Ambulatory Visit: Payer: Self-pay

## 2017-07-11 DIAGNOSIS — R1013 Epigastric pain: Secondary | ICD-10-CM | POA: Diagnosis not present

## 2017-07-11 DIAGNOSIS — Z87891 Personal history of nicotine dependence: Secondary | ICD-10-CM | POA: Insufficient documentation

## 2017-07-11 DIAGNOSIS — J45909 Unspecified asthma, uncomplicated: Secondary | ICD-10-CM | POA: Diagnosis not present

## 2017-07-11 DIAGNOSIS — E039 Hypothyroidism, unspecified: Secondary | ICD-10-CM | POA: Insufficient documentation

## 2017-07-11 DIAGNOSIS — R079 Chest pain, unspecified: Secondary | ICD-10-CM | POA: Diagnosis present

## 2017-07-11 LAB — COMPREHENSIVE METABOLIC PANEL
ALT: 32 U/L (ref 0–44)
AST: 25 U/L (ref 15–41)
Albumin: 3.6 g/dL (ref 3.5–5.0)
Alkaline Phosphatase: 62 U/L (ref 38–126)
Anion gap: 8 (ref 5–15)
BUN: 22 mg/dL — ABNORMAL HIGH (ref 6–20)
CO2: 26 mmol/L (ref 22–32)
Calcium: 8.5 mg/dL — ABNORMAL LOW (ref 8.9–10.3)
Chloride: 107 mmol/L (ref 98–111)
Creatinine, Ser: 0.85 mg/dL (ref 0.44–1.00)
GFR calc Af Amer: >60 mL/min (ref >60)
GFR calc non Af Amer: >60 mL/min (ref >60)
Glucose, Bld: 99 mg/dL (ref 70–99)
Potassium: 3.9 mmol/L (ref 3.5–5.1)
Sodium: 141 mmol/L (ref 135–145)
Total Bilirubin: 0.4 mg/dL (ref 0.3–1.2)
Total Protein: 6.9 g/dL (ref 6.5–8.1)

## 2017-07-11 LAB — CBC WITH DIFFERENTIAL/PLATELET
Basophils Absolute: 0.1 10*3/uL (ref 0.0–0.1)
Basophils Relative: 1 %
Eosinophils Absolute: 0.4 10*3/uL (ref 0.0–0.7)
Eosinophils Relative: 5 %
HCT: 38 % (ref 36.0–46.0)
Hemoglobin: 12.6 g/dL (ref 12.0–15.0)
Lymphocytes Relative: 29 %
Lymphs Abs: 2.7 10*3/uL (ref 0.7–4.0)
MCH: 34 pg (ref 26.0–34.0)
MCHC: 33.2 g/dL (ref 30.0–36.0)
MCV: 102.4 fL — ABNORMAL HIGH (ref 78.0–100.0)
Monocytes Absolute: 1.1 10*3/uL — ABNORMAL HIGH (ref 0.1–1.0)
Monocytes Relative: 12 %
Neutro Abs: 5 10*3/uL (ref 1.7–7.7)
Neutrophils Relative %: 53 %
Platelets: 372 10*3/uL (ref 150–400)
RBC: 3.71 MIL/uL — ABNORMAL LOW (ref 3.87–5.11)
RDW: 12.2 % (ref 11.5–15.5)
WBC: 9.3 10*3/uL (ref 4.0–10.5)

## 2017-07-11 LAB — LIPASE, BLOOD: Lipase: 31 U/L (ref 11–51)

## 2017-07-11 MED ORDER — OMEPRAZOLE 20 MG PO CPDR: 40.0000 mg | DELAYED_RELEASE_CAPSULE | Freq: Every day | ORAL | 0 refills | Status: DC

## 2017-07-11 MED ORDER — GI COCKTAIL ~~LOC~~
30.0000 mL | Freq: Once | ORAL | Status: AC
  Administered 2017-07-11: 30 mL via ORAL
  Filled 2017-07-11: qty 30

## 2017-07-11 NOTE — Discharge Instructions (Addendum)
You were seen today for upper abdominal pain.  This is likely related to reflux or peptic ulcer.  Your other testing is reassuring.  Increase Prilosec to 40 mg daily.  Follow-up with gastroenterology for further evaluation.

## 2017-07-11 NOTE — ED Triage Notes (Addendum)
Pt states she was laying in bed and had an episode of reflux. C/o burning in her chest ever since. Pt states she feels "smothering like". Reports some L sided rib/chest pain that she is not currently having. States she's been nauseated after eating "for months" & "can't hardly eat". States she had planned on f/u with her PCP about that but couldn't wait. Pt speaking in clear complete sentences. NAD noted. Pt reports taking Mylanta tonight and omeprazole daily.

## 2017-07-11 NOTE — ED Provider Notes (Signed)
[facility]PART[facility] Provider Note   CSN: [id number] Arrival date & time: 07/11/17  0315     History   Chief Complaint Chief Complaint  Patient presents with  . Gastroesophageal Reflux    HPI Cathy Baxter is a 57 y.o. female.  HPI  This is a 57 year old female with a history of reflux, hiatal hernia, thyroid disease, asthma who presents with burning chest pain.  Patient reports that she woke up from sleep with burning pain that radiated from her epigastrium up into her throat.  This is the second night this is happened.  She states "it is smothering."  She rates her pain at 8 out of 10.  She did take Mylanta prior to arrival with some improvement.  She reports taking Nexium and Prilosec at home fairly regularly.  She states that over the last 2 to 3 months she has had increasing nausea and early satiety after eating.  She reports nausea.  She has not seen her doctor for that.  She denies any chest pain, shortness of breath, fevers.  Past Medical History:  Diagnosis Date  . Allergy   . Anemia   . Arthritis   . Asthma   . Chicken pox   . GERD (gastroesophageal reflux disease)   . Hiatal hernia   . Pigmentary glaucoma of both eyes   . Polyp of stomach   . Shingles   . Thyroid disease   . Urinary incontinence   . Uterine fibroid     Patient Active Problem List   Diagnosis Date Noted  . Obesity (BMI 30-39.9) 02/20/2016  . Cataract of both eyes 02/20/2016  . Pigmentary glaucoma of both eyes   . Hiatal hernia 11/06/2015  . Hyperlipidemia 11/06/2015  . Asthma 11/06/2015  . Uterine fibroids in pregnancy, postpartum condition 11/06/2015  . Hypothyroidism 10/17/2015    Past Surgical History:  Procedure Laterality Date  . CHOLECYSTECTOMY  2002  . KNEE ARTHROSCOPY Left 08/2015  . NASAL SINUS SURGERY  2000     OB History    Gravida  3   Para  3   Term  3   Preterm      AB      Living  2     SAB      TAB      Ectopic      Multiple        Live Births               Home Medications    Prior to Admission medications   Medication Sig Start Date End Date Taking? Authorizing Provider  albuterol (VENTOLIN HFA) 108 (90 Base) MCG/ACT inhaler Inhale 1-2 puffs into the lungs every 6 (six) hours as needed for wheezing or shortness of breath. 06/01/17   Kuneff, Renee A, DO  azithromycin (ZITHROMAX) 250 MG tablet 500 mg day 1, then 250 mg QD 06/01/17   Kuneff, Renee A, DO  dorzolamide-timolol (COSOPT) 22.3-6.8 MG/ML ophthalmic solution Place 1 drop into both eyes 2 (two) times daily.  09/18/15   [provider]  fexofenadine (ALLEGRA) 180 MG tablet Take 180 mg by mouth daily.    [provider]  fluticasone (FLONASE) 50 MCG/ACT nasal spray Place 2 sprays into both nostrils daily. 05/09/15   Brayton Caves, PA-C  Fluticasone-Salmeterol (ADVAIR) 100-50 MCG/DOSE AEPB Inhale 1 puff into the lungs 2 (two) times daily. 06/01/17   Kuneff, Renee A, DO  latanoprost (XALATAN) 0.005 % ophthalmic solution Place 1 drop  into both eyes at bedtime.  05/24/16   [provider]  levothyroxine (SYNTHROID, LEVOTHROID) 25 MCG tablet Take 1 tablet (25 mcg total) by mouth daily before breakfast. 02/10/17   Kuneff, Renee A, DO  montelukast (SINGULAIR) 10 MG tablet Take 1 tablet (10 mg total) by mouth at bedtime. 06/01/17   Kuneff, Renee A, DO  omeprazole (PRILOSEC) 20 MG capsule Take 2 capsules (40 mg total) by mouth daily. 07/11/17   Berenice Oehlert, Barbette Hair, MD  pantoprazole (PROTONIX) 40 MG tablet Take 40 mg by mouth 2 (two) times daily as needed (for acid reflex).     [provider]    Family History Family History  Problem Relation Age of Onset  . Heart disease Mother   . Early death Father   . Cancer Son 33       Neuroepithelioma     Social History Social History   Tobacco Use  . Smoking status: Former Smoker    Types: Cigarettes    Last attempt to quit: 07/12/1983    Years since quitting: 34.0  . Smokeless  tobacco: Never Used  Substance Use Topics  . Alcohol use: Yes    Comment: social  . Drug use: No     Allergies   Percocet [oxycodone-acetaminophen]; Amoxicillin; Doxycycline; Oxycodone; and Demerol [meperidine]   Review of Systems Review of Systems  Constitutional: Negative for fever.  Respiratory: Negative for shortness of breath.   Cardiovascular: Negative for chest pain.  Gastrointestinal: Positive for abdominal pain, nausea and vomiting. Negative for diarrhea.  Genitourinary: Negative for dysuria.  Neurological: Negative for weakness and headaches.  All other systems reviewed and are negative.    Physical Exam Updated Vital Signs BP (!) 113/55 (BP Location: Right Arm)   Pulse 74   Temp 97.7 F (36.5 C) (Oral)   Resp 18   Ht 5\' 3"  (1.6 m)   Wt 86.2 kg (190 lb)   SpO2 100%   BMI 33.66 kg/m   Physical Exam  Constitutional: She is oriented to person, place, and time. She appears well-developed and well-nourished.  Overweight, no acute distress  HENT:  Head: Normocephalic and atraumatic.  Neck: Neck supple.  Cardiovascular: Normal rate, regular rhythm and normal heart sounds.  Pulmonary/Chest: Effort normal and breath sounds normal. No respiratory distress. She has no wheezes.  Abdominal: Soft. Bowel sounds are normal. There is tenderness. There is no rebound and no guarding.  Epigastric tenderness palpation, no rebound or guarding  Neurological: She is alert and oriented to person, place, and time.  Skin: Skin is warm and dry.  Psychiatric: She has a normal mood and affect.  Nursing note and vitals reviewed.    ED Treatments / Results  Labs (all labs ordered are listed, but only abnormal results are displayed) Labs Reviewed  CBC WITH DIFFERENTIAL/PLATELET - Abnormal; Notable for the following components:      Result Value   RBC 3.71 (*)    MCV 102.4 (*)    Monocytes Absolute 1.1 (*)    All other components within normal limits  COMPREHENSIVE METABOLIC  PANEL - Abnormal; Notable for the following components:   BUN 22 (*)    Calcium 8.5 (*)    All other components within normal limits  LIPASE, BLOOD    EKG EKG Interpretation  Date/Time:  Monday July 11 2017 03:53:55 EDT Ventricular Rate:  73 PR Interval:    QRS Duration: 100 QT Interval:  405 QTC Calculation: 447 R Axis:   -2 Text Interpretation:  Sinus rhythm Low voltage, precordial leads Confirmed by Thayer Jew [phone]) on 07/11/2017 4:31:58 AM   Radiology No results found.  Procedures Procedures (including critical care time)  Medications Ordered in ED Medications  gi cocktail (Maalox,Lidocaine,Donnatal) (30 mLs Oral Given 07/11/17 0343)     Initial Impression / Assessment and Plan / ED Course  I have reviewed the triage vital signs and the nursing notes.  Pertinent labs & imaging results that were available during my care of the patient were reviewed by me and considered in my medical decision making (see chart for details).     Patient presents with burning chest pain that woke her from sleep.  She is overall nontoxic-appearing on exam.  She reports this happened similarly last night.  She also reports recent history of early satiety and nausea with eating.  She is overall nontoxic and vital signs are reassuring.  EKG shows no evidence of ischemia.  History is highly suggestive of reflux, peptic ulcer, cholecystitis, pancreatitis.  Screening lab work was obtained.  LFTs are normal.  Lipase is normal.  Patient did have some significant improvement with a GI cocktail.  She is able to tolerate fluids.  Recommend close follow-up with gastroenterology for EGD and further testing.  Increase Prilosec to 40 mg daily.  After history, exam, and medical workup I feel the patient has been appropriately medically screened and is safe for discharge home. Pertinent diagnoses were discussed with the patient. Patient was given return precautions.   Final Clinical Impressions(s) / ED  Diagnoses   Final diagnoses:  Epigastric pain    ED Discharge Orders        Ordered    omeprazole (PRILOSEC) 20 MG capsule  Daily     07/11/17 0447       Merryl Hacker, MD 07/11/17 [phone]

## 2017-07-12 ENCOUNTER — Encounter (INDEPENDENT_AMBULATORY_CARE_PROVIDER_SITE_OTHER): Payer: Self-pay | Admitting: Orthopaedic Surgery

## 2017-07-12 ENCOUNTER — Ambulatory Visit (INDEPENDENT_AMBULATORY_CARE_PROVIDER_SITE_OTHER): Payer: Worker's Compensation

## 2017-07-12 ENCOUNTER — Ambulatory Visit (INDEPENDENT_AMBULATORY_CARE_PROVIDER_SITE_OTHER): Payer: Worker's Compensation | Admitting: Orthopaedic Surgery

## 2017-07-12 DIAGNOSIS — M25562 Pain in left knee: Secondary | ICD-10-CM | POA: Diagnosis not present

## 2017-07-12 DIAGNOSIS — M25561 Pain in right knee: Secondary | ICD-10-CM | POA: Diagnosis not present

## 2017-07-12 DIAGNOSIS — M545 Low back pain: Secondary | ICD-10-CM | POA: Diagnosis not present

## 2017-07-12 NOTE — Progress Notes (Signed)
Office Visit Note   Patient: Cathy Baxter           Date of Birth: [birth year]           MRN: [id number] Visit Date: 07/12/2017              Requested by: Ma Hillock, DO [address] [place], [place] [address] PCP: Ma Hillock, DO   Assessment & Plan: Visit Diagnoses:  1. Acute pain of both knees   2. Acute bilateral low back pain, with sciatica presence unspecified     Plan: My first step would be a combination of physical therapy as well as a steroid injection in both knees.  I explained the rationale behind injections and placement of both knees and she is not diabetic.  Her case manager is with her today and I let the case manager noted we are recommending physical therapy and her back in her knees however this cannot be initiated until she is cleared from my standpoint by her eye surgeon due to increased pressure that therapy can cause.  I gave a note for outpatient physical therapy and this can be initiated which she is clear from an eye surgeon standpoint.  We will continue to hold her out of work though even though she is been not already.  We will see her back in 4 weeks to see how she is doing overall.  All question concerns were answered and addressed.  Follow-Up Instructions: Return in about 1 month (around 08/09/2017).   Orders:  Orders Placed This Encounter  Procedures  . XR Knee 1-2 Views Left  . XR Knee 1-2 Views Right  . XR Lumbar Spine 2-3 Views   No orders of the defined types were placed in this encounter.     Procedures: No procedures performed   Clinical Data: No additional findings.   Subjective: Chief Complaint  Patient presents with  . Left Knee - Pain, Injury  The patient is a very pleasant 57 year old with a complicated history that I am seeing for bilateral knee pain and low back pain from a work-related accident.  She fell backwards somehow at work at [employer] potential on a wet floor back on April 22 of this year.  She has had bilateral  knee pain and low back pain since then.  Her left knee if needed we operated on remotely for a meniscal tear.  She says the knee has been doing well until this most recent fall.  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  She is not allowed to drive due to this.  She denies any radicular symptoms going down to her feet but does have bilateral leg swelling.  She is significantly obese.  She points to the left side of the low back source of her pain with no sciatic component.  HPI  Review of Systems She currently denies any headache or chest pain although she has been in the emergency room recently due to heartburn and acid reflux with a known hiatal hernia.  She denies any fever and chills.  She denies any nausea vomiting.  Objective: Vital Signs: There were no vitals taken for this visit.  Physical Exam She is alert and oriented in no acute distress she is moderately obese. Ortho Exam Examination of both knees show no effusion.  Both knees hyperextend.  Left knee has lateral and medial joint line tenderness  both knees have patellofemoral crepitation.  Both knees feel ligamentously stable.  Both legs have a large soft tissue envelope.  There is no pitting edema but her left calf does appear little larger than the right calf.  There is no Homans sign or palpable cords in the calves.  Examination lumbar spine shows significant pain to just palpation on the left side of her lumbar spine but no sciatic component and some of the pain seemed to be a portion of exam. Specialty Comments:  No specialty comments available.  Imaging: Xr Knee 1-2 Views Left  Result Date: 07/12/2017 2 views of the left knee show no acute findings.  There is significant lateral joint space narrowing and patellofemoral chondromalacia.  This is changed slightly from films compared to the left knee from 2017.  Xr Knee 1-2 Views Right  Result Date:  07/12/2017 2 views of the right knee show no acute findings.  There is no effusion.  There is patellofemoral chondromalacia.  Xr Lumbar Spine 2-3 Views  Result Date: 07/12/2017 2 views of the lumbar spine    PMFS History: Patient Active Problem List   Diagnosis Date Noted  . Obesity (BMI 30-39.9) 02/20/2016  . Cataract of both eyes 02/20/2016  . Pigmentary glaucoma of both eyes   . Hiatal hernia 11/06/2015  . Hyperlipidemia 11/06/2015  . Asthma 11/06/2015  . Uterine fibroids in pregnancy, postpartum condition 11/06/2015  . Hypothyroidism 10/17/2015   Past Medical History:  Diagnosis Date  . Allergy   . Anemia   . Arthritis   . Asthma   . Chicken pox   . GERD (gastroesophageal reflux disease)   . Hiatal hernia   . Pigmentary glaucoma of both eyes   . Polyp of stomach   . Shingles   . Thyroid disease   . Urinary incontinence   . Uterine fibroid     Family History  Problem Relation Age of Onset  . Heart disease Mother   . Early death Father   . Cancer Son 10       Neuroepithelioma     Past Surgical History:  Procedure Laterality Date  . CHOLECYSTECTOMY  2002  . KNEE ARTHROSCOPY Left 08/2015  . NASAL SINUS SURGERY  2000   Social History   Occupational History  . Occupation: stock  Tobacco Use  . Smoking status: Former Smoker    Types: Cigarettes    Last attempt to quit: 07/12/1983    Years since quitting: 34.0  . Smokeless tobacco: Never Used  Substance and Sexual Activity  . Alcohol use: Yes    Comment: social  . Drug use: No  . Sexual activity: Yes    Partners: Male    Birth control/protection: Post-menopausal

## 2017-07-21 ENCOUNTER — Telehealth (INDEPENDENT_AMBULATORY_CARE_PROVIDER_SITE_OTHER): Payer: Self-pay | Admitting: Orthopaedic Surgery

## 2017-07-21 NOTE — Telephone Encounter (Signed)
See below

## 2017-07-21 NOTE — Telephone Encounter (Signed)
Per Melissa w/[facility] the patient's eye doctor thas released her to go back to work. Melissa also wants to know if Dr Ninfa Linden is ok with patient returning back to work for light duty.

## 2017-07-21 NOTE — Telephone Encounter (Signed)
I am fine with light-duty work.

## 2017-07-22 ENCOUNTER — Encounter (INDEPENDENT_AMBULATORY_CARE_PROVIDER_SITE_OTHER): Payer: Self-pay

## 2017-07-22 NOTE — Telephone Encounter (Signed)
Faxed to [facility] at [phone]

## 2017-08-01 ENCOUNTER — Telehealth (INDEPENDENT_AMBULATORY_CARE_PROVIDER_SITE_OTHER): Payer: Self-pay

## 2017-08-01 NOTE — Telephone Encounter (Signed)
Emailed the 07/12/17 office note to the case mgr per her request

## 2017-08-10 ENCOUNTER — Ambulatory Visit (INDEPENDENT_AMBULATORY_CARE_PROVIDER_SITE_OTHER): Payer: Worker's Compensation | Admitting: Orthopaedic Surgery

## 2017-08-10 ENCOUNTER — Telehealth (INDEPENDENT_AMBULATORY_CARE_PROVIDER_SITE_OTHER): Payer: Self-pay | Admitting: Orthopaedic Surgery

## 2017-08-10 ENCOUNTER — Encounter (INDEPENDENT_AMBULATORY_CARE_PROVIDER_SITE_OTHER): Payer: Self-pay | Admitting: Orthopaedic Surgery

## 2017-08-10 DIAGNOSIS — M25562 Pain in left knee: Secondary | ICD-10-CM

## 2017-08-10 DIAGNOSIS — M545 Low back pain: Secondary | ICD-10-CM | POA: Diagnosis not present

## 2017-08-10 DIAGNOSIS — M25561 Pain in right knee: Secondary | ICD-10-CM | POA: Diagnosis not present

## 2017-08-10 NOTE — Progress Notes (Signed)
Patient is continue to follow-up with low back pain and bilateral knee pain after work-related accident when she fell backwards back in April of this year it is almost 4 months now since then.  She is finally going to physical therapy.  Physical therapy had to be delayed due to severe eye issues.  She still getting burning pain in both legs at different times.  She is getting severe low back pain and bilateral knee pain.  We placed steroid injections in both knees last visit she said that helped may be just a little bit but she really has not seen a difference of anything he was starting therapy.  On exam she has limited mobility of the lumbar spine and limited mobility of her knees.  She is morbidly obese as well.  At this point I did talk with the case manager.  About a loss of what else I can figure out to do for her except for an MRI of the lumbar spine and MRI both her knees to help determine if there is any type of derangement in any areas that can shed light on why she hurts the way she does what is causing the radicular symptoms and pain.  We will keep her out of work in the interim.  We will see her back after his MRIs have been obtained.

## 2017-08-10 NOTE — Telephone Encounter (Signed)
07/12/2017 xray reports faxed to [phone]

## 2017-08-24 ENCOUNTER — Telehealth (INDEPENDENT_AMBULATORY_CARE_PROVIDER_SITE_OTHER): Payer: Self-pay

## 2017-08-24 NOTE — Telephone Encounter (Signed)
Faxed the 08/10/17 office note to the case mgr per her request

## 2017-08-29 ENCOUNTER — Encounter (INDEPENDENT_AMBULATORY_CARE_PROVIDER_SITE_OTHER): Payer: Self-pay | Admitting: Orthopaedic Surgery

## 2017-08-29 ENCOUNTER — Ambulatory Visit (INDEPENDENT_AMBULATORY_CARE_PROVIDER_SITE_OTHER): Payer: Worker's Compensation | Admitting: Orthopaedic Surgery

## 2017-08-29 DIAGNOSIS — M545 Low back pain: Secondary | ICD-10-CM

## 2017-08-29 DIAGNOSIS — M25562 Pain in left knee: Secondary | ICD-10-CM | POA: Diagnosis not present

## 2017-08-29 DIAGNOSIS — M25561 Pain in right knee: Secondary | ICD-10-CM

## 2017-08-29 NOTE — Progress Notes (Signed)
The patient is here today with her case manager to go over her bilateral knee MRIs and low back MRI.  She is been dealing with debilitating pain in her back in her knees for some time now since a work-related fall earlier this year.  She is tried and failed other conservative treatment measures.  She is been out of work the entire time but some of this is been related to eye issues as well.  The MRI of her left and right knees were reviewed.  She has significant medial compartment degenerative changes that is pre-existing on both knees.  The left knee does show a radial tear of the root of the meniscus but she has had pretty existing arthroscopy on that knee and some of this is related to that.  Most of her pain though appears to be coming from the cartilage all she has the medial compartment of both her knees which is certainly pre-existing.  This can be aggravated in terms of a fall.  She does have radicular components of pain going down her sciatic region on the left side and numbness in the right foot.  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  From my standpoint I do feel that she would benefit from weight loss.  She also benefit from attempted hyaluronic acid on both knees.  I gave a note to her case manager reflecting this.  Also feel that she would benefit from bilateral L4 epidural steroid injections.  I will give her a note to allow her to return to sedentary work only.  We will see her back when she has been improved for any type of injections.  All questions were addressed.

## 2017-08-31 ENCOUNTER — Telehealth (INDEPENDENT_AMBULATORY_CARE_PROVIDER_SITE_OTHER): Payer: Self-pay

## 2017-08-31 NOTE — Telephone Encounter (Signed)
Per last office note knee injection and ESI were recommended and both have been approved per case mgr. Can call pt directly to schedule these as she does not have to accompany pt for these appts.

## 2017-09-01 ENCOUNTER — Telehealth (INDEPENDENT_AMBULATORY_CARE_PROVIDER_SITE_OTHER): Payer: Self-pay | Admitting: *Deleted

## 2017-09-01 NOTE — Telephone Encounter (Signed)
Pt is scheduled 09/16/17 with driver, No BTs.

## 2017-09-01 NOTE — Telephone Encounter (Signed)
L4-5 interlam to start and possible bilateral L4 tf esi depending on relief, may be workers comp? His not talks about case mgr.

## 2017-09-02 NOTE — Telephone Encounter (Signed)
Can we make her an appt with Artis Delay or Ninfa Linden next available appointment (no rush)  [facility] has approved her knee injection  Thank you!

## 2017-09-02 NOTE — Telephone Encounter (Signed)
Patient is scheduled 09/19/17 at 3:30pm for knee injection

## 2017-09-16 ENCOUNTER — Ambulatory Visit (INDEPENDENT_AMBULATORY_CARE_PROVIDER_SITE_OTHER): Payer: Self-pay

## 2017-09-16 ENCOUNTER — Encounter (INDEPENDENT_AMBULATORY_CARE_PROVIDER_SITE_OTHER): Payer: Self-pay | Admitting: Physical Medicine and Rehabilitation

## 2017-09-16 ENCOUNTER — Ambulatory Visit (INDEPENDENT_AMBULATORY_CARE_PROVIDER_SITE_OTHER): Payer: Worker's Compensation | Admitting: Physical Medicine and Rehabilitation

## 2017-09-16 VITALS — BP 135/80 | Temp 98.2°F

## 2017-09-16 DIAGNOSIS — M5416 Radiculopathy, lumbar region: Secondary | ICD-10-CM | POA: Diagnosis not present

## 2017-09-16 MED ORDER — METHYLPREDNISOLONE ACETATE 80 MG/ML IJ SUSP
80.0000 mg | Freq: Once | INTRAMUSCULAR | Status: AC
  Administered 2017-09-16: 80 mg

## 2017-09-16 NOTE — Progress Notes (Signed)
.     Numeric Pain Rating Scale and Functional Assessment Average Pain 10   In the last MONTH (on 0-10 scale) has pain interfered with the following?  1. General activity like being  able to carry out your everyday physical activities such as walking, climbing stairs, carrying groceries, or moving a chair?  Rating(7)   +Driver, -BT, -Dye Allergies.  

## 2017-09-16 NOTE — Patient Instructions (Signed)

## 2017-09-19 ENCOUNTER — Encounter (INDEPENDENT_AMBULATORY_CARE_PROVIDER_SITE_OTHER): Payer: Self-pay | Admitting: Orthopaedic Surgery

## 2017-09-19 ENCOUNTER — Ambulatory Visit (INDEPENDENT_AMBULATORY_CARE_PROVIDER_SITE_OTHER): Payer: Worker's Compensation | Admitting: Orthopaedic Surgery

## 2017-09-19 DIAGNOSIS — M1712 Unilateral primary osteoarthritis, left knee: Secondary | ICD-10-CM | POA: Diagnosis not present

## 2017-09-19 DIAGNOSIS — M25562 Pain in left knee: Secondary | ICD-10-CM

## 2017-09-19 DIAGNOSIS — M1611 Unilateral primary osteoarthritis, right hip: Secondary | ICD-10-CM

## 2017-09-19 DIAGNOSIS — M25561 Pain in right knee: Secondary | ICD-10-CM | POA: Diagnosis not present

## 2017-09-19 MED ORDER — LIDOCAINE HCL 1 % IJ SOLN
3.0000 mL | INTRAMUSCULAR | Status: AC | PRN
  Administered 2017-09-19: 3 mL

## 2017-09-19 MED ORDER — HYALURONAN 88 MG/4ML IX SOSY
88.0000 mg | PREFILLED_SYRINGE | INTRA_ARTICULAR | Status: AC | PRN
  Administered 2017-09-19: 88 mg via INTRA_ARTICULAR

## 2017-09-19 MED ORDER — LIDOCAINE HCL 1 % IJ SOLN
3.0000 mL | INTRAMUSCULAR | Status: AC | PRN
Start: 2017-09-19 — End: 2017-09-19
  Administered 2017-09-19: 3 mL

## 2017-09-19 NOTE — Progress Notes (Signed)
   Procedure Note  Patient: Cathy Baxter             Date of Birth: 1960/07/02           MRN: [id number]             Visit Date: 09/19/2017  Procedures: Visit Diagnoses: Acute pain of both knees  Unilateral primary osteoarthritis, right hip  Unilateral primary osteoarthritis, left knee  Large Joint Inj: R knee on 09/19/2017 3:47 PM Indications: diagnostic evaluation and pain Details: 22 G 1.5 in needle, superolateral approach  Arthrogram: No  Medications: 3 mL lidocaine 1 %; 88 mg Hyaluronan 88 MG/4ML Outcome: tolerated well, no immediate complications Procedure, treatment alternatives, risks and benefits explained, specific risks discussed. Consent was given by the patient. Immediately prior to procedure a time out was called to verify the correct patient, procedure, equipment, support staff and site/side marked as required. Patient was prepped and draped in the usual sterile fashion.   Large Joint Inj: L knee on 09/19/2017 3:48 PM Indications: diagnostic evaluation and pain Details: 22 G 1.5 in needle, superolateral approach  Arthrogram: No  Medications: 3 mL lidocaine 1 %; 88 mg Hyaluronan 88 MG/4ML Outcome: tolerated well, no immediate complications Procedure, treatment alternatives, risks and benefits explained, specific risks discussed. Consent was given by the patient. Immediately prior to procedure a time out was called to verify the correct patient, procedure, equipment, support staff and site/side marked as required. Patient was prepped and draped in the usual sterile fashion.    The patient is here today for bilateral hyaluronic acid injections with Monovisc to treat the pain from osteoarthritis in her knees.  These knees had pain flareup after work-related fall.  She did let me know the last week she had an epidural steroid injection by Dr. Ernestina Patches in her lumbar spine and that is actually started to help.  We found on restricted duties at work with mainly sedentary work.   We are going to keep it on that for the next 2 months.  She understands the rationale while trying injections such as these in her knees.  Weight loss is to be important for her and her knees.  She tolerated the Monovisc injections well in both knees.  We will see her back in 8 weeks to see how she is doing overall.  She will continue the same current level of work restrictions and sedentary work that she is currently performing over the next 8 weeks as we see how she does from these injections.  All question concerns were answered and addressed.

## 2017-09-22 ENCOUNTER — Ambulatory Visit: Payer: BLUE CROSS/BLUE SHIELD | Admitting: Internal Medicine

## 2017-10-03 ENCOUNTER — Telehealth (INDEPENDENT_AMBULATORY_CARE_PROVIDER_SITE_OTHER): Payer: Self-pay | Admitting: Orthopaedic Surgery

## 2017-10-03 NOTE — Telephone Encounter (Signed)
Records faxed to [facility], Case Manager [facility] [phone]

## 2017-10-04 HISTORY — PX: ESOPHAGOGASTRODUODENOSCOPY: SHX1529

## 2017-10-04 NOTE — Progress Notes (Signed)
Cathy Baxter - 57 y.o. female MRN [id number]  Date of birth: 1960/04/01  Office Visit Note: Visit Date: 09/16/2017 PCP: Ma Hillock, DO Referred by: Ma Hillock, DO  Subjective: Chief Complaint  Patient presents with  . Lower Back - Pain  . Left Leg - Pain  . Left Hip - Pain   HPI: Cathy Baxter is a 57 year old female that comes in at the request of Dr. Jean Rosenthal for diagnostic of therapeutic left L4-5 interlaminar epidural steroid injection.  Patient has reported left hip and leg pain and some groin pain occasionally lower back pain flared up after a fall in April 2019.  This was evidently a work-related fall managed by Dr. Ninfa Linden.  He does have her on work restrictions which Cathy Baxter is following up with him in a few days for work restrictions.  Her pain is worse with standing and lifting.  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  Otherwise spine is normal for age.   ROS Otherwise per HPI.  Assessment & Plan: Visit Diagnoses:  1. Lumbar radiculopathy     Plan: No additional findings.   Meds & Orders:  Meds ordered this encounter  Medications  . methylPREDNISolone acetate (DEPO-MEDROL) injection 80 mg    Orders Placed This Encounter  Procedures  . XR C-ARM NO REPORT  . Epidural Steroid injection    Follow-up: Return if symptoms worsen or fail to improve.   Procedures: No procedures performed  Lumbar Epidural Steroid Injection - Interlaminar Approach with Fluoroscopic Guidance  Patient: Cathy Baxter      Date of Birth: Dec 25, 1960 MRN: [id number] PCP: Ma Hillock, DO      Visit Date: 09/16/2017   Universal Protocol:     Consent Given By: the patient  Position: PRONE  Additional Comments: Vital signs were monitored before and after the procedure. Patient was prepped and draped in the usual sterile  fashion. The correct patient, procedure, and site was verified.   Injection Procedure Details:  Procedure Site One Meds Administered:  Meds ordered this encounter  Medications  . methylPREDNISolone acetate (DEPO-MEDROL) injection 80 mg     Laterality: Left  Location/Site:  L4-L5  Needle size: 20 G  Needle type: Tuohy  Needle Placement: Paramedian epidural  Findings:   -Comments: Excellent flow of contrast into the epidural space.  Procedure Details: Using a paramedian approach from the side mentioned above, the region overlying the inferior lamina was localized under fluoroscopic visualization and the soft tissues overlying this structure were infiltrated with 4 ml. of 1% Lidocaine without Epinephrine. The Tuohy needle was inserted into the epidural space using a paramedian approach.   The epidural space was localized using loss of resistance along with lateral and bi-planar fluoroscopic views.  After negative aspirate for air, blood, and CSF, a 2 ml. volume of Isovue-250 was injected into the epidural space and the flow of contrast was observed. Radiographs were obtained for documentation purposes.    The injectate was administered into the level noted above.   Additional Comments:  The patient tolerated the procedure well Dressing: Band-Aid    Post-procedure details: Patient was observed during the procedure. Post-procedure instructions were reviewed.  Patient left the clinic in stable condition.   Clinical History: FINDINGS:  Bones:Vertebral body heights are maintained. No acute fracture. No focal lesions. Spinal cord/conus: No abnormal signal or mass.Conus  terminates at normal level. Alignment: Minimal grade 1 anterolisthesis L4 on L5.  Degenerative changes: T12-L1: Minimal disc bulge with trace effacement of the anterior aspect of thecal sac. No significant foraminal stenosis.  L1-L2: Small disc bulge. Moderate facet degenerative changes ligamentum  flavum hypertrophy. Mild to moderate central canal stenosis. Minimal bilateral foraminal stenosis.   L2-L3: Bilateral facet degenerative changes. Small disc bulge. Mild central canal stenosis. No significant foraminal stenosis.  L3-L4: Broad-based disc bulge. Moderate facet degenerative changes and ligamentum flavum hypertrophy. Mild-to-moderate central canal stenosis.   L4-L5: Broad-based disc bulge. Extensive facet degenerative changes and ligamentum flavum hypertrophy. At least moderate central canal stenosis. Moderate to advanced left and moderate right foraminal stenosis. Disc material closely associated with and  potentially abutting the exiting nerve roots bilaterally.  L5-S1: Small disc bulge. Mild bilateral foraminal stenosis. Mild central canal stenosis. Mild-to-moderate facet degenerative changes.  Paraspinal soft tissues: Unremarkable  Incidental:  None.   Cathy Baxter reports that Cathy Baxter quit smoking about 34 years ago. Her smoking use included cigarettes. Cathy Baxter has never used smokeless tobacco. No results for input(s): HGBA1C, LABURIC in the last 8760 hours.  Objective:  VS:  HT:    WT:   BMI:     BP:135/80  HR: bpm  TEMP:98.2 F (36.8 C)(Oral)  RESP:  Physical Exam  Ortho Exam Imaging: No results found.  Past Medical/Family/Surgical/Social History: Medications & Allergies reviewed per EMR, new medications updated. Patient Active Problem List   Diagnosis Date Noted  . Obesity (BMI 30-39.9) 02/20/2016  . Cataract of both eyes 02/20/2016  . Pigmentary glaucoma of both eyes   . Hiatal hernia 11/06/2015  . Hyperlipidemia 11/06/2015  . Asthma 11/06/2015  . Uterine fibroids in pregnancy, postpartum condition 11/06/2015  . Hypothyroidism 10/17/2015   Past Medical History:  Diagnosis Date  . Allergy   . Anemia   . Arthritis   . Asthma   . Chicken pox   . GERD (gastroesophageal reflux disease)   . Hiatal hernia   . Pigmentary glaucoma of both eyes   . Polyp of stomach    . Shingles   . Thyroid disease   . Urinary incontinence   . Uterine fibroid    Family History  Problem Relation Age of Onset  . Heart disease Mother   . Early death Father   . Cancer Son 10       Neuroepithelioma    Past Surgical History:  Procedure Laterality Date  . CHOLECYSTECTOMY  2002  . KNEE ARTHROSCOPY Left 08/2015  . NASAL SINUS SURGERY  2000   Social History   Occupational History  . Occupation: stock  Tobacco Use  . Smoking status: Former Smoker    Types: Cigarettes    Last attempt to quit: 07/12/1983    Years since quitting: 34.2  . Smokeless tobacco: Never Used  Substance and Sexual Activity  . Alcohol use: Yes    Comment: social  . Drug use: No  . Sexual activity: Yes    Partners: Male    Birth control/protection: Post-menopausal

## 2017-10-04 NOTE — Procedures (Signed)
Lumbar Epidural Steroid Injection - Interlaminar Approach with Fluoroscopic Guidance  Patient: Cathy Baxter      Date of Birth: 1960/06/30 MRN: [id number] PCP: Ma Hillock, DO      Visit Date: 09/16/2017   Universal Protocol:     Consent Given By: the patient  Position: PRONE  Additional Comments: Vital signs were monitored before and after the procedure. Patient was prepped and draped in the usual sterile fashion. The correct patient, procedure, and site was verified.   Injection Procedure Details:  Procedure Site One Meds Administered:  Meds ordered this encounter  Medications  . methylPREDNISolone acetate (DEPO-MEDROL) injection 80 mg     Laterality: Left  Location/Site:  L4-L5  Needle size: 20 G  Needle type: Tuohy  Needle Placement: Paramedian epidural  Findings:   -Comments: Excellent flow of contrast into the epidural space.  Procedure Details: Using a paramedian approach from the side mentioned above, the region overlying the inferior lamina was localized under fluoroscopic visualization and the soft tissues overlying this structure were infiltrated with 4 ml. of 1% Lidocaine without Epinephrine. The Tuohy needle was inserted into the epidural space using a paramedian approach.   The epidural space was localized using loss of resistance along with lateral and bi-planar fluoroscopic views.  After negative aspirate for air, blood, and CSF, a 2 ml. volume of Isovue-250 was injected into the epidural space and the flow of contrast was observed. Radiographs were obtained for documentation purposes.    The injectate was administered into the level noted above.   Additional Comments:  The patient tolerated the procedure well Dressing: Band-Aid    Post-procedure details: Patient was observed during the procedure. Post-procedure instructions were reviewed.  Patient left the clinic in stable condition.

## 2017-10-06 ENCOUNTER — Telehealth (INDEPENDENT_AMBULATORY_CARE_PROVIDER_SITE_OTHER): Payer: Self-pay | Admitting: Orthopaedic Surgery

## 2017-10-06 NOTE — Telephone Encounter (Signed)
I'm fine with no more PT.  She still needs to continue efforts to lose weight.

## 2017-10-06 NOTE — Telephone Encounter (Signed)
Please advise 

## 2017-10-06 NOTE — Telephone Encounter (Signed)
Melissa, nurse case manager for patient at [facility] is needing to know if patient needs to continue with physical therapy, according to patient she said she didn't have to. Please advise Melissa # [phone]

## 2017-10-07 NOTE — Telephone Encounter (Signed)
Cathy Baxter aware of the below message from [place]

## 2017-10-12 ENCOUNTER — Ambulatory Visit: Payer: BLUE CROSS/BLUE SHIELD | Admitting: Family Medicine

## 2017-10-19 ENCOUNTER — Ambulatory Visit (INDEPENDENT_AMBULATORY_CARE_PROVIDER_SITE_OTHER): Payer: Worker's Compensation

## 2017-10-19 ENCOUNTER — Ambulatory Visit: Payer: BLUE CROSS/BLUE SHIELD | Admitting: Family Medicine

## 2017-10-19 ENCOUNTER — Encounter (INDEPENDENT_AMBULATORY_CARE_PROVIDER_SITE_OTHER): Payer: Self-pay | Admitting: Physician Assistant

## 2017-10-19 ENCOUNTER — Other Ambulatory Visit: Payer: Self-pay | Admitting: Obstetrics & Gynecology

## 2017-10-19 ENCOUNTER — Ambulatory Visit (INDEPENDENT_AMBULATORY_CARE_PROVIDER_SITE_OTHER): Payer: Worker's Compensation | Admitting: Orthopaedic Surgery

## 2017-10-19 VITALS — Ht 62.0 in | Wt 210.0 lb

## 2017-10-19 DIAGNOSIS — N644 Mastodynia: Secondary | ICD-10-CM

## 2017-10-19 DIAGNOSIS — Z0289 Encounter for other administrative examinations: Secondary | ICD-10-CM

## 2017-10-19 DIAGNOSIS — M79604 Pain in right leg: Secondary | ICD-10-CM

## 2017-10-19 DIAGNOSIS — M79605 Pain in left leg: Secondary | ICD-10-CM

## 2017-10-19 NOTE — Progress Notes (Signed)
Patient is a 57 year old female well-known to [place].  She slipped on wet floor in April of this year injuring her back in her knees.  Since that period of time we have had her just on sedentary work.  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  Actually she says now her quad muscles hurt and she herself by her hips significantly.  Since this accident we have had an MRI of her lumbar spine and plain x-rays of both knees.  She does have pre-existing arthritis in her knees.  She had a left knee arthroscopy that we did in 2017 for a previous twisting injury.  At that time we found some arthritic changes in her knee.  A lot of this is pre-existing and weight placed into the issues with her knees however she can have an acute flareup of issues after mechanical fall like she had.  She does report that her lumbar spine is better overall.  She has had an intervention of the lumbar spine by our physiatry with Dr. Ernestina Patches.  We have provided hyaluronic acid injections in both knees.  She says her left knee at this point is throbbing in terms of the pain.  She is frustrated overall.  Her case managers with her today as well.  On exam both hips move fluidly.  Her pain seems to be at the proximal quads of both legs.  Both knees hurt on exam with the left knee worse than the right.  At this point I have given a note specifically for her job stating that she should only sit at work all day long and only walk in and out of work.  If they cannot accommodate this she needs to be out of work completely.  We are going to obtain also an MRI of both knees to further assess her cartilage as well.  We will see her back after the MRI of her knees.

## 2017-10-20 ENCOUNTER — Telehealth (INDEPENDENT_AMBULATORY_CARE_PROVIDER_SITE_OTHER): Payer: Self-pay | Admitting: Orthopaedic Surgery

## 2017-10-20 NOTE — Telephone Encounter (Signed)
Melissa  WC adjuster  [phone]    Lenna Sciara would like to know if patient needs a sec round of MRIs of patient knees. Lenna Sciara stated patient already received  MRI on the 9th of August, a copy was faxed to the office as well.

## 2017-10-21 NOTE — Telephone Encounter (Signed)
See below

## 2017-10-21 NOTE — Telephone Encounter (Signed)
Do you happen to have this WC adjustors number by chance? If so can you email her this response from [place]? Her voicemail if full so I can't leave her this message

## 2017-10-21 NOTE — Telephone Encounter (Signed)
That's right, she already had MRI's of both knees, so we don't need more MRI's.  We just need her work to be compliant with sedintary only work or I need to have her out of work completely.

## 2017-10-24 NOTE — Telephone Encounter (Signed)
Need clarification on this patient so I can advise case mgr  1)since pt has already had MRI, does Dr. Ninfa Linden recommend any other treatment at this time?  2)if not, is she released? 3)has appt scheduled for 11/17/17 that was scheduled back in Sept. Does she need to keep this one since she was just seen? This appt was a 2 mo rov but she was seen sooner.

## 2017-10-24 NOTE — Telephone Encounter (Signed)
See below

## 2017-10-24 NOTE — Telephone Encounter (Signed)
Emailed Dr. Trevor Mace response to [facility] @ [email or web address])

## 2017-10-24 NOTE — Telephone Encounter (Signed)
I think that the best thing to do for now will be to either keep her out of work completely or have her only do sedentary work if they can actually reassure [place] that that is how she is doing.  I have had her on sedentary work but she stated that she had to walk a lot at work and she was having pain in her knees and thighs since then.  Most likely she just needs to keep the appointment on November 7 so I can reevaluate her after a period of more substantial rest.  There is no other treatment is I am recommending this for her.

## 2017-10-25 ENCOUNTER — Ambulatory Visit
Admission: RE | Admit: 2017-10-25 | Discharge: 2017-10-25 | Disposition: A | Discharge status: Home / Self Care | Payer: BLUE CROSS/BLUE SHIELD | Source: Ambulatory Visit | Attending: Obstetrics & Gynecology | Admitting: Obstetrics & Gynecology

## 2017-10-25 ENCOUNTER — Ambulatory Visit: Payer: BLUE CROSS/BLUE SHIELD

## 2017-10-25 DIAGNOSIS — N644 Mastodynia: Secondary | ICD-10-CM

## 2017-10-25 NOTE — Telephone Encounter (Signed)
Emailed the case mgr Dr. Trevor Mace response and asked her to please verify if employer is going to accommodate sedentary duty

## 2017-11-17 ENCOUNTER — Encounter (INDEPENDENT_AMBULATORY_CARE_PROVIDER_SITE_OTHER): Payer: Self-pay | Admitting: Orthopaedic Surgery

## 2017-11-17 ENCOUNTER — Ambulatory Visit (INDEPENDENT_AMBULATORY_CARE_PROVIDER_SITE_OTHER): Payer: Worker's Compensation | Admitting: Orthopaedic Surgery

## 2017-11-17 DIAGNOSIS — M1712 Unilateral primary osteoarthritis, left knee: Secondary | ICD-10-CM

## 2017-11-17 MED ORDER — METHYLPREDNISOLONE ACETATE 40 MG/ML IJ SUSP
40.0000 mg | INTRAMUSCULAR | Status: AC | PRN
  Administered 2017-11-17: 40 mg via INTRA_ARTICULAR

## 2017-11-17 MED ORDER — LIDOCAINE HCL 1 % IJ SOLN
3.0000 mL | INTRAMUSCULAR | Status: AC | PRN
  Administered 2017-11-17: 3 mL

## 2017-11-17 NOTE — Progress Notes (Signed)
Office Visit Note   Patient: Cathy Baxter           Date of Birth: 01/16/1960           MRN: [id number] Visit Date: 11/17/2017              Requested by: Ma Hillock, DO [address] [place], [place] [address] PCP: Ma Hillock, DO   Assessment & Plan: Visit Diagnoses:  1. Unilateral primary osteoarthritis, left knee     Plan: I do feel is appropriate to try a one-time steroid injection again in the knee today and see how she responds to this.  I do feel that her pre-existing arthritis and obesity coming in the play in her falls aggravated these things.  It is difficult to recommend arthroscopic intervention based on her exam and her pain as well as the MRI findings.  Would not recommend a knee replacement given her significant obesity.  She does need to work on weight loss.  She tolerated steroid injection well.  I talked to the case manager about keeping her on restricted duty work and we will see her back next week to see how she is responded to the steroid injection.  Follow-Up Instructions: Return in about 1 week (around 11/24/2017).   Orders:  Orders Placed This Encounter  Procedures  . Large Joint Inj   No orders of the defined types were placed in this encounter.     Procedures: Large Joint Inj: L knee on 11/17/2017 3:44 PM Indications: diagnostic evaluation and pain Details: 22 G 1.5 in needle, superolateral approach  Arthrogram: No  Medications: 3 mL lidocaine 1 %; 40 mg methylPREDNISolone acetate 40 MG/ML Outcome: tolerated well, no immediate complications Procedure, treatment alternatives, risks and benefits explained, specific risks discussed. Consent was given by the patient. Immediately prior to procedure a time out was called to verify the correct patient, procedure, equipment, support staff and site/side marked as required. Patient was prepped and draped in the usual sterile fashion.       Clinical Data: No additional  findings.   Subjective: Chief Complaint  Patient presents with  . Left Knee - Pain  The patient is coming in today with continued left knee pain and throbbing all related from a work-related fall earlier this year.  She already had pre-existing arthritis in that knee but in the fall is aggravated things significantly.  She is also someone who is severely obese with a BMI of 38-39.  We have performed arthroscopic intervention of that left knee years ago.  My more recent MRI showed worsening of the medial compartment arthritis and a small meniscal root tear.  She denies any locking catching and even reports throbbing pain throughout her knee going up into her thigh and hip area.  She does walk with a limp.  Her case managers with her today.  She is been working mainly sedentary type work.  HPI  Review of Systems She currently denies any fever, chills, nausea, vomiting.  Objective: Vital Signs: There were no vitals taken for this visit.  Physical Exam She is alert and oriented x3 and in no acute distress Ortho Exam Examination of her left knee shows no effusion but she has global tenderness and pain with good range of motion. Specialty Comments:  No specialty comments available.  Imaging: No results found.   PMFS History: Patient Active Problem List   Diagnosis Date Noted  . Obesity (BMI 30-39.9) 02/20/2016  . Cataract of both  eyes 02/20/2016  . Pigmentary glaucoma of both eyes   . Hiatal hernia 11/06/2015  . Hyperlipidemia 11/06/2015  . Asthma 11/06/2015  . Uterine fibroids in pregnancy, postpartum condition 11/06/2015  . Hypothyroidism 10/17/2015   Past Medical History:  Diagnosis Date  . Allergy   . Anemia   . Arthritis   . Asthma   . Chicken pox   . GERD (gastroesophageal reflux disease)   . Hiatal hernia   . Pigmentary glaucoma of both eyes   . Polyp of stomach   . Shingles   . Thyroid disease   . Urinary incontinence   . Uterine fibroid     Family History   Problem Relation Age of Onset  . Heart disease Mother   . Early death Father   . Cancer Son 10       Neuroepithelioma   . Breast cancer Sister 45    Past Surgical History:  Procedure Laterality Date  . CHOLECYSTECTOMY  2002  . KNEE ARTHROSCOPY Left 08/2015  . NASAL SINUS SURGERY  2000   Social History   Occupational History  . Occupation: stock  Tobacco Use  . Smoking status: Former Smoker    Types: Cigarettes    Last attempt to quit: 07/12/1983    Years since quitting: 34.3  . Smokeless tobacco: Never Used  Substance and Sexual Activity  . Alcohol use: Yes    Comment: social  . Drug use: No  . Sexual activity: Yes    Partners: Male    Birth control/protection: Post-menopausal

## 2017-11-24 ENCOUNTER — Encounter (INDEPENDENT_AMBULATORY_CARE_PROVIDER_SITE_OTHER): Payer: Self-pay | Admitting: Orthopaedic Surgery

## 2017-11-24 ENCOUNTER — Ambulatory Visit (INDEPENDENT_AMBULATORY_CARE_PROVIDER_SITE_OTHER): Payer: Worker's Compensation | Admitting: Orthopaedic Surgery

## 2017-11-24 DIAGNOSIS — M23322 Other meniscus derangements, posterior horn of medial meniscus, left knee: Secondary | ICD-10-CM | POA: Diagnosis not present

## 2017-11-24 NOTE — Progress Notes (Signed)
The patient is continue to follow-up for injury sustained after mechanical fall that was work-related.  She is someone that we had performed arthroscopic surgery for something unrelated a year or 2 ago on the left knee.  After her fall she is had significant and severe worsening left knee pain.  For the last several months we have tried all modalities of conservative treatment to get her feeling better.  She is work on activity modification and has not been able lose weight.  Her BMI is 38.4.  She has been to physical therapy.  She has had steroid injections in her knee as well as hyaluronic acid injections.  She is been on anti-inflammatories as well.  Her pain is still been quite severe in the knee keeps giving out on her she states.  Last week we did provide a steroid injection in her left knee and she said it felt incredibly well as if she never had any issues for a day or 2 but then 2 to 3 days later it was even worse than before with pain still going into her thigh but also her knee.  She still having trouble walking.  She is having to hold on things she states to walk.  She is still been working through this but on light duty work.  On examination of her knee there is significant obesity with the knee.  It hurts globally.  Is difficult to tell if there is no effusion or not.  Her pain is medial and lateral but more medial.  Her hip exam is normal.  This point I do feel that arthroscopic intervention would be the next step.  She is tried and failed all forms of conservative treatment.  An MRI of that knee earlier the year did show medial compartment 3 changes and I did show evidence of her previous partial medial meniscectomy but I cannot rule out that there is additional tearing and potentially her meniscal root tear.  I talked with her about this in detail and she does wish to proceed with an arthroscopic intervention at this point given her continued significant issues with this left knee.  I agree at  this point as well because we have exhausted all conservative treatment measures and this seems the next logical step in order to hopefully get her feeling better.  We will work on getting this scheduled.  She will continue light duty work for now until we can get [employer] approval for a left knee arthroscopy with partial medial meniscectomy and debridement as needed.  Hopefully this will be a diagnostic and therapeutic arthroscopy.

## 2017-12-21 ENCOUNTER — Ambulatory Visit: Payer: BLUE CROSS/BLUE SHIELD | Admitting: Family Medicine

## 2017-12-28 ENCOUNTER — Ambulatory Visit (INDEPENDENT_AMBULATORY_CARE_PROVIDER_SITE_OTHER): Payer: BLUE CROSS/BLUE SHIELD | Admitting: Family Medicine

## 2017-12-28 ENCOUNTER — Encounter: Payer: Self-pay | Admitting: Family Medicine

## 2017-12-28 VITALS — BP 114/77 | HR 68 | Temp 97.8°F | Resp 16 | Ht 62.0 in | Wt 213.0 lb

## 2017-12-28 DIAGNOSIS — M79604 Pain in right leg: Secondary | ICD-10-CM | POA: Diagnosis not present

## 2017-12-28 DIAGNOSIS — E7849 Other hyperlipidemia: Secondary | ICD-10-CM | POA: Diagnosis not present

## 2017-12-28 DIAGNOSIS — D7589 Other specified diseases of blood and blood-forming organs: Secondary | ICD-10-CM

## 2017-12-28 DIAGNOSIS — M79605 Pain in left leg: Secondary | ICD-10-CM

## 2017-12-28 NOTE — Patient Instructions (Signed)
We will call you once we get your  Labs back and make plan.  This could be from vitamin deficiencies or low back causes .  You do not have any swelling or edema.     High Cholesterol  High cholesterol is a condition in which the blood has high levels of a white, waxy, fat-like substance (cholesterol). The human body needs small amounts of cholesterol. The liver makes all the cholesterol that the body needs. Extra (excess) cholesterol comes from the food that we eat. Cholesterol is carried from the liver by the blood through the blood vessels. If you have high cholesterol, deposits (plaques) may build up on the walls of your blood vessels (arteries). Plaques make the arteries narrower and stiffer. Cholesterol plaques increase your risk for heart attack and stroke. Work with your health care provider to keep your cholesterol levels in a healthy range. What increases the risk? This condition is more likely to develop in people who:  Eat foods that are high in animal fat (saturated fat) or cholesterol.  Are overweight.  Are not getting enough exercise.  Have a family history of high cholesterol. What are the signs or symptoms? There are no symptoms of this condition. How is this diagnosed? This condition may be diagnosed from the results of a blood test.  If you are older than age 75, your health care provider may check your cholesterol every 4-6 years.  You may be checked more often if you already have high cholesterol or other risk factors for heart disease. The blood test for cholesterol measures:  "Bad" cholesterol (LDL cholesterol). This is the main type of cholesterol that causes heart disease. The desired level for LDL is less than 100.  "Good" cholesterol (HDL cholesterol). This type helps to protect against heart disease by cleaning the arteries and carrying the LDL away. The desired level for HDL is 60 or higher.  Triglycerides. These are fats that the body can store or burn  for energy. The desired number for triglycerides is lower than 150.  Total cholesterol. This is a measure of the total amount of cholesterol in your blood, including LDL cholesterol, HDL cholesterol, and triglycerides. A healthy number is less than 200. How is this treated? This condition is treated with diet changes, lifestyle changes, and medicines. Diet changes  This may include eating more whole grains, fruits, vegetables, nuts, and fish.  This may also include cutting back on red meat and foods that have a lot of added sugar. Lifestyle changes  Changes may include getting at least 40 minutes of aerobic exercise 3 times a week. Aerobic exercises include walking, biking, and swimming. Aerobic exercise along with a healthy diet can help you maintain a healthy weight.  Changes may also include quitting smoking. Medicines  Medicines are usually given if diet and lifestyle changes have failed to reduce your cholesterol to healthy levels.  Your health care provider may prescribe a statin medicine. Statin medicines have been shown to reduce cholesterol, which can reduce the risk of heart disease. Follow these instructions at home: Eating and drinking If told by your health care provider:  Eat chicken (without skin), fish, veal, shellfish, ground [place] breast, and round or loin cuts of red meat.  Do not eat fried foods or fatty meats, such as hot dogs and salami.  Eat plenty of fruits, such as apples.  Eat plenty of vegetables, such as broccoli, potatoes, and carrots.  Eat beans, peas, and lentils.  Eat grains such as  barley, rice, couscous, and bulgur wheat.  Eat pasta without cream sauces.  Use skim or nonfat milk, and eat low-fat or nonfat yogurt and cheeses.  Do not eat or drink whole milk, cream, ice cream, egg yolks, or hard cheeses.  Do not eat stick margarine or tub margarines that contain trans fats (also called partially hydrogenated oils).  Do not eat saturated  tropical oils, such as coconut oil and palm oil.  Do not eat cakes, cookies, crackers, or other baked goods that contain trans fats.  General instructions  Exercise as directed by your health care provider. Increase your activity level with activities such as gardening, walking, and taking the stairs.  Take over-the-counter and prescription medicines only as told by your health care provider.  Do not use any products that contain nicotine or tobacco, such as cigarettes and e-cigarettes. If you need help quitting, ask your health care provider.  Keep all follow-up visits as told by your health care provider. This is important. Contact a health care provider if:  You are struggling to maintain a healthy diet or weight.  You need help to start on an exercise program.  You need help to stop smoking. Get help right away if:  You have chest pain.  You have trouble breathing. This information is not intended to replace advice given to you by your health care provider. Make sure you discuss any questions you have with your health care provider. Document Released: 12/28/2004 Document Revised: 07/26/2015 Document Reviewed: 06/28/2015 Elsevier Interactive Patient Education  [employer].

## 2017-12-28 NOTE — Progress Notes (Signed)
Cathy Baxter , 22-May-1960, 57 y.o., female MRN: [id number] Patient Care Team    Relationship Specialty Notifications Start End  Ma Hillock, DO PCP - General Family Medicine  08/30/16   Philemon Kingdom, MD Consulting Physician Internal Medicine  02/20/16    Comment: endocrine  Linda Hedges, DO Consulting Physician Obstetrics and Gynecology  02/20/16     Chief Complaint  Patient presents with  . Hyperlipidemia    Leg pain, feels like he has brusing to the top of her feet. Had elevated cholesterol when she was at her obgyn office.      Subjective:   Hyperlipidemia:  Patient reports she was at her gynecologist office approximately 4 weeks ago and was told she had elevated cholesterol by labs.  She does not bring her labs with her today.  Believes her LDL was approximately 160 but unsure.  Bilateral Leg and feet hurt: started 2-3 months ago. Mostly on the top of feet and top of legs.  Patient reports very sensitive to touch over this area.  She denies any numbness and tingling or temperature change of her legs.  She does have lower back issues and has a specialist.  She denies any erythema or swelling.  Depression screen [facility] 2/9 06/01/2017 02/20/2016  Decreased Interest 0 0  Down, Depressed, Hopeless 0 0  PHQ - 2 Score 0 0    Allergies  Allergen Reactions  . Percocet [Oxycodone-Acetaminophen] Shortness Of Breath  . Amoxicillin Hives    Has patient had a PCN reaction causing immediate rash, facial/tongue/throat swelling, SOB or lightheadedness with hypotension: Yes Has patient had a PCN reaction causing severe rash involving mucus membranes or skin necrosis: No Has patient had a PCN reaction that required hospitalization No Has patient had a PCN reaction occurring within the last 10 years: Yes  If all of the above answers are "NO", then may proceed with Cephalosporin use.   Marland Kitchen Doxycycline Itching  . Oxycodone   . Demerol [Meperidine] Palpitations   Social History   Tobacco  Use  . Smoking status: Former Smoker    Types: Cigarettes    Last attempt to quit: 07/12/1983    Years since quitting: 34.4  . Smokeless tobacco: Never Used  Substance Use Topics  . Alcohol use: Yes    Comment: social   Past Medical History:  Diagnosis Date  . Allergy   . Anemia   . Arthritis   . Asthma   . Chicken pox   . GERD (gastroesophageal reflux disease)   . Hiatal hernia   . Pigmentary glaucoma of both eyes   . Polyp of stomach   . Shingles   . Thyroid disease   . Urinary incontinence   . Uterine fibroid    Past Surgical History:  Procedure Laterality Date  . CHOLECYSTECTOMY  2002  . KNEE ARTHROSCOPY Left 08/2015  . NASAL SINUS SURGERY  2000   Family History  Problem Relation Age of Onset  . Heart disease Mother   . Early death Father   . Cancer Son 10       Neuroepithelioma   . Breast cancer Sister 54   Allergies as of 12/28/2017      Reactions   Percocet [oxycodone-acetaminophen] Shortness Of Breath   Amoxicillin Hives   Has patient had a PCN reaction causing immediate rash, facial/tongue/throat swelling, SOB or lightheadedness with hypotension: Yes Has patient had a PCN reaction causing severe rash involving mucus membranes or skin necrosis: No Has patient had  a PCN reaction that required hospitalization No Has patient had a PCN reaction occurring within the last 10 years: Yes  If all of the above answers are "NO", then may proceed with Cephalosporin use.   Doxycycline Itching   Oxycodone    Demerol [meperidine] Palpitations      Medication List       Accurate as of December 28, 2017  2:22 PM. Always use your most recent med list.        albuterol 108 (90 Base) MCG/ACT inhaler Commonly known as:  VENTOLIN HFA Inhale 1-2 puffs into the lungs every 6 (six) hours as needed for wheezing or shortness of breath.   dorzolamide-timolol 22.3-6.8 MG/ML ophthalmic solution Commonly known as:  COSOPT Place 1 drop into both eyes 2 (two) times daily.     fexofenadine 180 MG tablet Commonly known as:  ALLEGRA Take 180 mg by mouth daily.   fluticasone 50 MCG/ACT nasal spray Commonly known as:  FLONASE Place 2 sprays into both nostrils daily.   Fluticasone-Salmeterol 100-50 MCG/DOSE Aepb Commonly known as:  ADVAIR Inhale 1 puff into the lungs 2 (two) times daily.   latanoprost 0.005 % ophthalmic solution Commonly known as:  XALATAN Place 1 drop into both eyes at bedtime.   levothyroxine 25 MCG tablet Commonly known as:  SYNTHROID, LEVOTHROID Take 1 tablet (25 mcg total) by mouth daily before breakfast.   montelukast 10 MG tablet Commonly known as:  SINGULAIR Take 1 tablet (10 mg total) by mouth at bedtime.   omeprazole 20 MG capsule Commonly known as:  PRILOSEC Take 2 capsules (40 mg total) by mouth daily.   pantoprazole 40 MG tablet Commonly known as:  PROTONIX Take 40 mg by mouth 2 (two) times daily as needed (for acid reflex).       All past medical history, surgical history, allergies, family history, immunizations andmedications were updated in the EMR today and reviewed under the history and medication portions of their EMR.     ROS: Negative, with the exception of above mentioned in HPI   Objective:  BP 114/77 (BP Location: Right Arm, Patient Position: Sitting, Cuff Size: Large)   Pulse 68   Temp 97.8 F (36.6 C) (Oral)   Resp 16   Ht _0  (1.575 m)   Wt 213 lb (96.6 kg)   SpO2 97%   BMI 38.96 kg/m  Body mass index is 38.96 kg/m. Gen: Afebrile. No acute distress. Nontoxic in appearance, well developed, well nourished.  HENT: AT. [place]. MMM Eyes:Pupils Equal Round Reactive to light, Extraocular movements intact,  Conjunctiva without redness, discharge or icterus. CV: RRR, no edema Chest: CTAB, no wheeze or crackles.   MSK: Bilateral lower extremities without erythema, swelling.  Tender to palpation to light touch diffusely of her bilateral lower extremities.  No edema. Skin: No rashes, purpura or  petechiae.  Warm, well-perfused, intact. Neuro:  Normal gait. PERLA. EOMi. Alert. Oriented x3  No exam data present No results found. No results found for this or any previous visit (from the past 24 hour(s)).  Assessment/Plan: Cathy Baxter is a 57 y.o. female present for OV for   hyperlipidemia -reQuested records from gynecology, still have not received records next day. Start pravastatin 20 mg daily and follow-up in 3 months hopefully by then we will have her records compare. - Comp Met (CMET) - pravastatin (PRAVACHOL) 20 MG tablet; Take 1 tablet (20 mg total) by mouth daily.  Dispense: 90 tablet; Refill: 3  Macrocytosis She is no  longer drinking any alcohol. - CBC w/Diff - B12 and Folate Panel  Bilateral leg pain -Tender to mild touch bilateral lower extremities with an otherwise normal exam.  Consider fibromyalgia as potential cause.  Will obtain vitamin D and B12 levels as well.  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 - B12 and Folate Panel  Hypocalcemia - Comp Met (CMET) - Vitamin D (25 hydroxy)   Reviewed expectations re: course of current medical issues.  Discussed self-management of symptoms.  Outlined signs and symptoms indicating need for more acute intervention.  Patient verbalized understanding and all questions were answered.  Patient received an After-Visit Summary.    No orders of the defined types were placed in this encounter.    Note is dictated utilizing voice recognition software. Although note has been proof read prior to signing, occasional typographical errors still can be missed. If any questions arise, please do not hesitate to call for verification.   electronically signed by:  Howard Pouch, DO  [place]

## 2017-12-29 ENCOUNTER — Telehealth: Payer: Self-pay | Admitting: Family Medicine

## 2017-12-29 ENCOUNTER — Encounter: Payer: Self-pay | Admitting: Family Medicine

## 2017-12-29 LAB — CBC WITH DIFFERENTIAL/PLATELET
Absolute Monocytes: 893 cells/uL (ref 200–950)
Basophils Absolute: 68 cells/uL (ref 0–200)
Basophils Relative: 0.8 %
Eosinophils Absolute: 451 cells/uL (ref 15–500)
Eosinophils Relative: 5.3 %
HCT: 37.4 % (ref 35.0–45.0)
Hemoglobin: 13 g/dL (ref 11.7–15.5)
Lymphs Abs: 2244 cells/uL (ref 850–3900)
MCH: 33.9 pg — ABNORMAL HIGH (ref 27.0–33.0)
MCHC: 34.8 g/dL (ref 32.0–36.0)
MCV: 97.4 fL (ref 80.0–100.0)
MPV: 9.8 fL (ref 7.5–12.5)
Monocytes Relative: 10.5 %
Neutro Abs: 4845 cells/uL (ref 1500–7800)
Neutrophils Relative %: 57 %
Platelets: 428 10*3/uL — ABNORMAL HIGH (ref 140–400)
RBC: 3.84 10*6/uL (ref 3.80–5.10)
RDW: 11.5 % (ref 11.0–15.0)
Total Lymphocyte: 26.4 %
WBC: 8.5 10*3/uL (ref 3.8–10.8)

## 2017-12-29 LAB — COMPREHENSIVE METABOLIC PANEL
AG Ratio: 1.3 (calc) (ref 1.0–2.5)
ALT: 31 U/L — ABNORMAL HIGH (ref 6–29)
AST: 23 U/L (ref 10–35)
Albumin: 4 g/dL (ref 3.6–5.1)
Alkaline phosphatase (APISO): 71 U/L (ref 33–130)
BUN: 12 mg/dL (ref 7–25)
CO2: 24 mmol/L (ref 20–32)
Calcium: 9.2 mg/dL (ref 8.6–10.4)
Chloride: 103 mmol/L (ref 98–110)
Creat: 0.81 mg/dL (ref 0.50–1.05)
Globulin: 3.1 g/dL (calc) (ref 1.9–3.7)
Glucose, Bld: 87 mg/dL (ref 65–99)
Potassium: 4.3 mmol/L (ref 3.5–5.3)
Sodium: 137 mmol/L (ref 135–146)
Total Bilirubin: 0.4 mg/dL (ref 0.2–1.2)
Total Protein: 7.1 g/dL (ref 6.1–8.1)

## 2017-12-29 LAB — B12 AND FOLATE PANEL
Folate: 10.8 ng/mL
Vitamin B-12: 363 pg/mL (ref 200–1100)

## 2017-12-29 LAB — VITAMIN D 25 HYDROXY (VIT D DEFICIENCY, FRACTURES): Vit D, 25-Hydroxy: 33 ng/mL (ref 30–100)

## 2017-12-29 MED ORDER — PRAVASTATIN SODIUM 20 MG PO TABS: 20.0000 mg | ORAL_TABLET | Freq: Every day | ORAL | 3 refills | Status: DC

## 2017-12-29 NOTE — Telephone Encounter (Signed)
Pt notifed of results and recommendations. Pt voiced understanding and will call back with any other questions.

## 2017-12-29 NOTE — Telephone Encounter (Signed)
Please inform patient the following information: Labs overall look pretty good.  Her vitamin D is on the low end of normal and her B12 is rather low.  I would recommend she start vitamin D 800 units daily and B12 1000 mcg daily.  This may help with her lower extremity sensations. We have still not received the cholesterol labs from her gynecologist.  I have went ahead and started a medication called pravastatin at low dose, this is a cholesterol medicine.  Please start this medication daily and follow-up in 3 months fasting.

## 2018-01-09 ENCOUNTER — Telehealth (INDEPENDENT_AMBULATORY_CARE_PROVIDER_SITE_OTHER): Payer: Self-pay | Admitting: Orthopaedic Surgery

## 2018-01-09 ENCOUNTER — Telehealth (INDEPENDENT_AMBULATORY_CARE_PROVIDER_SITE_OTHER): Payer: Self-pay | Admitting: Orthopedic Surgery

## 2018-01-09 NOTE — Telephone Encounter (Signed)
Is this ok?

## 2018-01-09 NOTE — Telephone Encounter (Signed)
That will be fine. 

## 2018-01-09 NOTE — Telephone Encounter (Signed)
Patient she is having serve left leg pain would like a letter to write her out of work until surgery date

## 2018-01-10 ENCOUNTER — Encounter (INDEPENDENT_AMBULATORY_CARE_PROVIDER_SITE_OTHER): Payer: Self-pay

## 2018-01-10 NOTE — Telephone Encounter (Signed)
LMOM for patient letting her know that her note is ready at the front desk

## 2018-01-11 HISTORY — PX: CATARACT EXTRACTION W/ INTRAOCULAR LENS IMPLANT: SHX1309

## 2018-01-24 ENCOUNTER — Ambulatory Visit (INDEPENDENT_AMBULATORY_CARE_PROVIDER_SITE_OTHER): Payer: Self-pay | Admitting: Orthopaedic Surgery

## 2018-01-25 ENCOUNTER — Ambulatory Visit (INDEPENDENT_AMBULATORY_CARE_PROVIDER_SITE_OTHER): Payer: Self-pay | Admitting: Orthopaedic Surgery

## 2018-01-26 ENCOUNTER — Other Ambulatory Visit (INDEPENDENT_AMBULATORY_CARE_PROVIDER_SITE_OTHER): Payer: Self-pay | Admitting: Orthopaedic Surgery

## 2018-01-26 DIAGNOSIS — M94262 Chondromalacia, left knee: Secondary | ICD-10-CM

## 2018-01-26 HISTORY — PX: KNEE ARTHROSCOPY: SUR90

## 2018-01-26 MED ORDER — HYDROCODONE-ACETAMINOPHEN 5-325 MG PO TABS: 1.0000 | ORAL_TABLET | Freq: Four times a day (QID) | ORAL | 0 refills | Status: DC | PRN

## 2018-02-03 ENCOUNTER — Ambulatory Visit (INDEPENDENT_AMBULATORY_CARE_PROVIDER_SITE_OTHER): Payer: BLUE CROSS/BLUE SHIELD | Admitting: Family Medicine

## 2018-02-03 ENCOUNTER — Encounter: Payer: Self-pay | Admitting: Family Medicine

## 2018-02-03 VITALS — BP 126/76 | HR 74 | Temp 98.2°F | Resp 16 | Ht 62.0 in | Wt 216.0 lb

## 2018-02-03 DIAGNOSIS — J4541 Moderate persistent asthma with (acute) exacerbation: Secondary | ICD-10-CM

## 2018-02-03 DIAGNOSIS — R531 Weakness: Secondary | ICD-10-CM | POA: Diagnosis not present

## 2018-02-03 DIAGNOSIS — R5383 Other fatigue: Secondary | ICD-10-CM

## 2018-02-03 DIAGNOSIS — E039 Hypothyroidism, unspecified: Secondary | ICD-10-CM

## 2018-02-03 DIAGNOSIS — R748 Abnormal levels of other serum enzymes: Secondary | ICD-10-CM | POA: Diagnosis not present

## 2018-02-03 DIAGNOSIS — E669 Obesity, unspecified: Secondary | ICD-10-CM | POA: Diagnosis not present

## 2018-02-03 DIAGNOSIS — J301 Allergic rhinitis due to pollen: Secondary | ICD-10-CM

## 2018-02-03 MED ORDER — LEVOCETIRIZINE DIHYDROCHLORIDE 5 MG PO TABS: 5.0000 mg | ORAL_TABLET | Freq: Every evening | ORAL | 3 refills | Status: DC

## 2018-02-03 MED ORDER — FLUTICASONE-SALMETEROL 100-50 MCG/DOSE IN AEPB: 1.0000 | INHALATION_SPRAY | Freq: Two times a day (BID) | RESPIRATORY_TRACT | 5 refills | Status: DC

## 2018-02-03 MED ORDER — MONTELUKAST SODIUM 10 MG PO TABS: 10.0000 mg | ORAL_TABLET | Freq: Every day | ORAL | 3 refills | Status: DC

## 2018-02-03 NOTE — Progress Notes (Signed)
Cathy Baxter , March 31, 1960, 58 y.o., female MRN: [id number] Patient Care Team    Relationship Specialty Notifications Start End  Ma Hillock, DO PCP - General Family Medicine  08/30/16   Philemon Kingdom, MD Consulting Physician Internal Medicine  02/20/16    Comment: endocrine  Linda Hedges, DO Consulting Physician Obstetrics and Gynecology  02/20/16     Chief Complaint  Patient presents with  . Fatigue    x >1 yr, states that she noticed a change when her thyroid dose changed, staes it got worse when she started taking vitamins and felt extremely tired, had a wk were she did nothing but sleep, so she quit taking the vitamins.    . Nasal Congestion    States that her sinuses stay stopped up all the time      Subjective: Pt presents for an OV with complaints of fatigue of 1 year duration.  Associated symptoms include allergy symptoms that have worsened over last few weeks. She states she feels symptoms started when her thyroid dose was changed- which she  States was years ago. She also thought it worsened when she started Vit D and B12, so she stopped taking them. She has gained weight. She has arthritis and recent knee surgery. She denies fever, chills, nausea, vomit, diarrhea or rash. She is prescribed advair and reports compliance- but only taking once a day-  for asthma. She is taking allegra daily and Singulair. She reports compliance with low dose synthroid 25 mcg daily. She has had chronic intermittent elevated ALT.  Depression screen [facility] 2/9 06/01/2017 02/20/2016  Decreased Interest 0 0  Down, Depressed, Hopeless 0 0  PHQ - 2 Score 0 0    Allergies  Allergen Reactions  . Percocet [Oxycodone-Acetaminophen] Shortness Of Breath  . Amoxicillin Hives    Has patient had a PCN reaction causing immediate rash, facial/tongue/throat swelling, SOB or lightheadedness with hypotension: Yes Has patient had a PCN reaction causing severe rash involving mucus membranes or skin necrosis:  No Has patient had a PCN reaction that required hospitalization No Has patient had a PCN reaction occurring within the last 10 years: Yes  If all of the above answers are "NO", then may proceed with Cephalosporin use.   Marland Kitchen Doxycycline Itching  . Oxycodone   . Demerol [Meperidine] Palpitations   Social History   Tobacco Use  . Smoking status: Former Smoker    Types: Cigarettes    Last attempt to quit: 07/12/1983    Years since quitting: 34.5  . Smokeless tobacco: Never Used  Substance Use Topics  . Alcohol use: Yes    Comment: social   Past Medical History:  Diagnosis Date  . Allergy   . Anemia   . Arthritis   . Asthma   . Chicken pox   . GERD (gastroesophageal reflux disease)   . Hiatal hernia   . Pigmentary glaucoma of both eyes   . Polyp of stomach   . Shingles   . Thyroid disease   . Urinary incontinence   . Uterine fibroid    Past Surgical History:  Procedure Laterality Date  . CHOLECYSTECTOMY  2002  . KNEE ARTHROSCOPY Left 08/2015  . NASAL SINUS SURGERY  2000   Family History  Problem Relation Age of Onset  . Heart disease Mother   . Early death Father   . Cancer Son 10       Neuroepithelioma   . Breast cancer Sister 40   Allergies as of 02/03/2018  Reactions   Percocet [oxycodone-acetaminophen] Shortness Of Breath   Amoxicillin Hives   Has patient had a PCN reaction causing immediate rash, facial/tongue/throat swelling, SOB or lightheadedness with hypotension: Yes Has patient had a PCN reaction causing severe rash involving mucus membranes or skin necrosis: No Has patient had a PCN reaction that required hospitalization No Has patient had a PCN reaction occurring within the last 10 years: Yes  If all of the above answers are "NO", then may proceed with Cephalosporin use.   Doxycycline Itching   Oxycodone    Demerol [meperidine] Palpitations      Medication List       Accurate as of February 03, 2018  3:30 PM. Always use your most recent med  list.        dorzolamide-timolol 22.3-6.8 MG/ML ophthalmic solution Commonly known as:  COSOPT Place 1 drop into both eyes 2 (two) times daily.   fexofenadine 180 MG tablet Commonly known as:  ALLEGRA Take 180 mg by mouth daily.   fluticasone 50 MCG/ACT nasal spray Commonly known as:  FLONASE Place 2 sprays into both nostrils daily.   Fluticasone-Salmeterol 100-50 MCG/DOSE Aepb Commonly known as:  ADVAIR Inhale 1 puff into the lungs 2 (two) times daily.   HYDROcodone-acetaminophen 5-325 MG tablet Commonly known as:  NORCO/VICODIN Take 1-2 tablets by mouth every 6 (six) hours as needed for moderate pain.   levothyroxine 25 MCG tablet Commonly known as:  SYNTHROID, LEVOTHROID Take 1 tablet (25 mcg total) by mouth daily before breakfast.   montelukast 10 MG tablet Commonly known as:  SINGULAIR Take 1 tablet (10 mg total) by mouth at bedtime.   pravastatin 20 MG tablet Commonly known as:  PRAVACHOL Take 1 tablet (20 mg total) by mouth daily.       All past medical history, surgical history, allergies, family history, immunizations andmedications were updated in the EMR today and reviewed under the history and medication portions of their EMR.     ROS: Negative, with the exception of above mentioned in HPI   Objective:  BP 126/76 (BP Location: Left Arm, Patient Position: Sitting, Cuff Size: Large)   Pulse 74   Temp 98.2 F (36.8 C) (Oral)   Resp 16   Ht 5\' 2"  (1.575 m)   Wt 216 lb (98 kg)   SpO2 96%   BMI 39.51 kg/m  Body mass index is 39.51 kg/m. Gen: Afebrile. No acute distress. Nontoxic in appearance, well developed, well nourished. Obese.  HENT: AT. [place]. Bilateral TM visualized without. MMM, no oral lesions. Bilateral nares with mild drainage. Throat without erythema or exudates. No cough or hoarseness Eyes:Pupils Equal Round Reactive to light, Extraocular movements intact,  Conjunctiva without redness, discharge or icterus. Neck/lymp/endocrine: Supple,no  lymphadenopathy CV: RRR 1/6 SM, no edema Chest: CTAB, no wheeze or crackles. Good air movement, normal resp effort.  Skin: no rashes, purpura or petechiae.  Neuro:  Normal gait. PERLA. EOMi. Alert. Oriented x3 Cranial nerves II through XII intact. Muscle strength 5/5 BLE (with exception to knee pain from recent surgery) extremity.  Psych: Normal affect, dress and demeanor. Normal speech. Normal thought content and judgment.  No exam data present No results found. No results found for this or any previous visit (from the past 24 hour(s)).  Assessment/Plan: Cathy Baxter is a 58 y.o. female present for OV for  fatigue/Weakness -Fatigue, arthritis and weakness has been a chronic issue for her for at least over a year.  She did recently have to have a  right knee surgery in [place] to arthritis of her knee.  Will rule out other causes with thyroid, vitamin D, iron and autoimmune panel. - TSH - Iron, TIBC and Ferritin Panel - Vitamin D (25 hydroxy) - ANA, IFA Comprehensive Panel-([facility])  Elevated liver enzymes -Chronic waxing and waning of liver enzymes.  Will rule out infectious disease given symptoms of chronic fatigue and weakness. 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  Advised her to take her Advair twice daily as prescribed, Singulair nightly. -Start Xyzal in place of [place].  Last her uncontrolled asthma can also cause fatigue. - Fluticasone-Salmeterol (ADVAIR) 100-50 MCG/DOSE AEPB; Inhale 1 puff into the lungs 2 (two) times daily.  Dispense: 1 each; Refill: 5 - montelukast (SINGULAIR) 10 MG tablet; Take 1 tablet (10 mg total) by mouth at bedtime.  Dispense: 90 tablet; Refill: 3   Hypothyroidism, unspecified typeObesity (BMI 30-39.9)/ He is on low-dose levothyroxine 25 mcg daily.  Has been on this medication for some time.  Recheck  TSH today.   Reviewed expectations re: course of current medical issues.  Discussed self-management of symptoms.  Outlined signs and symptoms indicating need for more acute intervention.  Patient verbalized understanding and all questions were answered.  Patient received an After-Visit Summary.    Orders Placed This Encounter  Procedures  . TSH  . Iron, TIBC and Ferritin Panel  . Vitamin D (25 hydroxy)  . ANA, IFA Comprehensive Panel-([facility])     Note is dictated utilizing voice recognition software. Although note has been proof read prior to signing, occasional typographical errors still can be missed. If any questions arise, please do not hesitate to call for verification.   electronically signed by:  Howard Pouch, DO  [place]

## 2018-02-03 NOTE — Patient Instructions (Signed)
Stop allegra- start xyzal at night.  Take your Singulair .  Take your advair inhaler twice a day.   Uncontrolled asthma will cause fatigue and you have a mild wheeze.    We will call you with lab results.    Fatigue If you have fatigue, you feel tired all the time and have a lack of energy or a lack of motivation. Fatigue may make it difficult to start or complete tasks because of exhaustion. In general, occasional or mild fatigue is often a normal response to activity or life. However, long-lasting (chronic) or extreme fatigue may be a symptom of a medical condition. Follow these instructions at home: General instructions  Watch your fatigue for any changes.  Go to bed and get up at the same time every day.  Avoid fatigue by pacing yourself during the day and getting enough sleep at night.  Maintain a healthy weight. Medicines  Take over-the-counter and prescription medicines only as told by your health care provider.  Take a multivitamin, if told by your health care provider.  Do not use herbal or dietary supplements unless they are approved by your health care provider. Activity   Exercise regularly, as told by your health care provider.  Use or practice techniques to help you relax, such as yoga, tai chi, meditation, or massage therapy. Eating and drinking   Avoid heavy meals in the evening.  Eat a well-balanced diet, which includes lean proteins, whole grains, plenty of fruits and vegetables, and low-fat dairy products.  Avoid consuming too much caffeine.  Avoid the use of alcohol.  Drink enough fluid to keep your urine pale yellow. Lifestyle  Change situations that cause you stress. Try to keep your work and personal schedule in balance.  Do not use any products that contain nicotine or tobacco, such as cigarettes and e-cigarettes. If you need help quitting, ask your health care provider.  Do not use drugs. Contact a health care provider if:  Your  fatigue does not get better.  You have a fever.  You suddenly lose or gain weight.  You have headaches.  You have trouble falling asleep or sleeping through the night.  You feel angry, guilty, anxious, or sad.  You are unable to have a bowel movement (constipation).  Your skin is dry.  You have swelling in your legs or another part of your body. Get help right away if:  You feel confused.  Your vision is blurry.  You feel faint or you pass out.  You have a severe headache.  You have severe pain in your abdomen, your back, or the area between your waist and hips (pelvis).  You have chest pain, shortness of breath, or an irregular or fast heartbeat.  You are unable to urinate, or you urinate less than normal.  You have abnormal bleeding, such as bleeding from the rectum, vagina, nose, lungs, or nipples.  You vomit blood.  You have thoughts about hurting yourself or others. If you ever feel like you may hurt yourself or others, or have thoughts about taking your own life, get help right away. You can go to your nearest emergency department or call:  Your local emergency services (911 in the [place]  A suicide crisis helpline, such as the [place] at 520-397-2747. This is open 24 hours a day. Summary  If you have fatigue, you feel tired all the time and have a lack of energy or a lack of motivation.  Fatigue may make  it difficult to start or complete tasks because of exhaustion.  Long-lasting (chronic) or extreme fatigue may be a symptom of a medical condition.  Exercise regularly, as told by your health care provider.  Change situations that cause you stress. Try to keep your work and personal schedule in balance. This information is not intended to replace advice given to you by your health care provider. Make sure you discuss any questions you have with your health care provider. Document Released: 10/25/2006 Document Revised:  09/22/2016 Document Reviewed: 09/22/2016 Elsevier Interactive Patient Education  [employer].

## 2018-02-06 ENCOUNTER — Encounter (INDEPENDENT_AMBULATORY_CARE_PROVIDER_SITE_OTHER): Payer: Self-pay | Admitting: Orthopaedic Surgery

## 2018-02-06 ENCOUNTER — Ambulatory Visit (INDEPENDENT_AMBULATORY_CARE_PROVIDER_SITE_OTHER): Payer: Worker's Compensation | Admitting: Orthopaedic Surgery

## 2018-02-06 DIAGNOSIS — Z9889 Other specified postprocedural states: Secondary | ICD-10-CM

## 2018-02-06 LAB — ANA, IFA COMPREHENSIVE PANEL
Anti Nuclear Antibody(ANA): POSITIVE — AB
ENA SM Ab Ser-aCnc: <1 AI
SM/RNP: <1 AI
SSA (Ro) (ENA) Antibody, IgG: <1 AI
SSB (La) (ENA) Antibody, IgG: <1 AI
Scleroderma (Scl-70) (ENA) Antibody, IgG: <1 AI
ds DNA Ab: <1 IU/mL

## 2018-02-06 LAB — HEPATITIS PANEL, ACUTE
Hep A IgM: NONREACTIVE
Hep B C IgM: NONREACTIVE
Hepatitis B Surface Ag: NONREACTIVE
Hepatitis C Ab: NONREACTIVE
SIGNAL TO CUT-OFF: 0.01 (ref <1.00)

## 2018-02-06 LAB — TSH: TSH: 1.02 mIU/L (ref 0.40–4.50)

## 2018-02-06 LAB — IRON,TIBC AND FERRITIN PANEL
%SAT: 33 % (calc) (ref 16–45)
Ferritin: 178 ng/mL (ref 16–232)
Iron: 108 ug/dL (ref 45–160)
TIBC: 330 mcg/dL (calc) (ref 250–450)

## 2018-02-06 LAB — HIV ANTIBODY (ROUTINE TESTING W REFLEX): HIV 1&2 Ab, 4th Generation: NONREACTIVE

## 2018-02-06 LAB — ANTI-NUCLEAR AB-TITER (ANA TITER): ANA Titer 1: 1:40 {titer} — ABNORMAL HIGH

## 2018-02-06 LAB — VITAMIN D 25 HYDROXY (VIT D DEFICIENCY, FRACTURES): Vit D, 25-Hydroxy: 35 ng/mL (ref 30–100)

## 2018-02-06 NOTE — Progress Notes (Signed)
The patient is here today for follow-up after having a left knee arthroscopy.  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  Her ACL was intact as well as her PCL.  Her lateral meniscus look good.  Her case manager is with her today.  She is also been dealing with low back pain.  All this comes from her fall in April 2019.  On exam her left knee looks good.  I removed sutures.  There is no significant effusion with good range of motion of the knee.  This point I do feel that she would benefit from aquatic therapy and still continued weight loss.  Therapy should work on strength in her legs and back.  From a work standpoint, will only have her perform sedentary work duties until further notice.  We will reevaluate her in 4 weeks.  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

## 2018-02-07 ENCOUNTER — Telehealth: Payer: Self-pay | Admitting: Family Medicine

## 2018-02-07 ENCOUNTER — Encounter: Payer: Self-pay | Admitting: Family Medicine

## 2018-02-07 DIAGNOSIS — R5383 Other fatigue: Secondary | ICD-10-CM

## 2018-02-07 DIAGNOSIS — R531 Weakness: Secondary | ICD-10-CM

## 2018-02-07 DIAGNOSIS — M1990 Unspecified osteoarthritis, unspecified site: Secondary | ICD-10-CM

## 2018-02-07 DIAGNOSIS — J301 Allergic rhinitis due to pollen: Secondary | ICD-10-CM | POA: Insufficient documentation

## 2018-02-07 NOTE — Telephone Encounter (Signed)
Please inform patient the following information: Her labs were all normal, except her autoimmune marker was positive. This marker just means that an autoimmune disease is possible- there are many potential autoimmune disorders.   With her history of fatigue and arthritis and weakness. I have referred her to a rheumatologist to further evaluate for potential Autoimmune disease causing her symptoms.  They will call her to schedule.

## 2018-02-08 NOTE — Telephone Encounter (Signed)
Left detailed message of results and provider recommendations on patient's phone, okay per DPR.   Okay for [facility] to discuss information if she calls back with questions.

## 2018-02-14 ENCOUNTER — Encounter (INDEPENDENT_AMBULATORY_CARE_PROVIDER_SITE_OTHER): Payer: Self-pay

## 2018-02-14 ENCOUNTER — Telehealth (INDEPENDENT_AMBULATORY_CARE_PROVIDER_SITE_OTHER): Payer: Self-pay

## 2018-02-14 NOTE — Telephone Encounter (Signed)
Faxed the 02/06/18 office note and work note to case mgr per her request

## 2018-03-06 ENCOUNTER — Ambulatory Visit (INDEPENDENT_AMBULATORY_CARE_PROVIDER_SITE_OTHER): Payer: Worker's Compensation | Admitting: Orthopaedic Surgery

## 2018-03-06 ENCOUNTER — Encounter (INDEPENDENT_AMBULATORY_CARE_PROVIDER_SITE_OTHER): Payer: Self-pay | Admitting: Orthopaedic Surgery

## 2018-03-06 DIAGNOSIS — Z9889 Other specified postprocedural states: Secondary | ICD-10-CM

## 2018-03-06 NOTE — Progress Notes (Signed)
The patient is continue to follow-up status post a left knee arthroscopy.  She had a work-related fall injuring her back and her left knee.  The arthroscopic intervention showed only grade III chondromalacia medial femoral condyle.  She has been working on physical therapy and weight loss.  She is now transitioning to aqua therapy.  Her case manager is with her today as well.  She has been doing somewhat better but gets still an occasional burning pain in the knee.  She has been performing to sedentary work duties.  On examination of her left knee there is no gross abnormalities other than pain with good range of motion.  She has a large soft tissue envelope around her knee due to her morbid obesity.  At this point I would like her to continue aqua therapy with transition to a functional capacity evaluation at the end of this therapy.  We will see her back in 4 weeks and hopefully she will have had an FCE done so we can determine what her disability may be as well as what her recommendations work may be.  All question concerns were answered and addressed.  Should continue sedentary work for now.

## 2018-03-07 ENCOUNTER — Ambulatory Visit: Payer: BLUE CROSS/BLUE SHIELD | Admitting: Family Medicine

## 2018-03-07 ENCOUNTER — Encounter: Payer: Self-pay | Admitting: Family Medicine

## 2018-03-07 VITALS — BP 100/68 | HR 83 | Temp 98.0°F | Resp 16 | Ht 62.0 in | Wt 215.1 lb

## 2018-03-07 DIAGNOSIS — J0101 Acute recurrent maxillary sinusitis: Secondary | ICD-10-CM | POA: Diagnosis not present

## 2018-03-07 MED ORDER — AZITHROMYCIN 250 MG PO TABS: ORAL_TABLET | ORAL | 0 refills | Status: DC

## 2018-03-07 NOTE — Progress Notes (Signed)
OFFICE VISIT  03/07/2018   CC:  Chief Complaint  Patient presents with  . Sinusitis    HPI:    Patient is a 58 y.o. Caucasian female with allergic rhinitis and mod persistent asthma who presents for "possible sinus infection". Lots of facial/sinus pressure chronically.  Some change in nasal mucous to dark green and thick about 1 wk ago.  Some forehead aching/peri-orbital HA on/off the last week.  Some brief pain in bilat maxillary sinus region last night when she had a nosebleed.  Some brief intermittent wheezing yesterday.  Minimal cough. No fevers.  No ST.  Some nausea on/off the last week.  No vomiting.  She talks 90 miles an hour and it is hard to get a clear distinction between chronic and acute symptoms.  Past Medical History:  Diagnosis Date  . Allergy   . Anemia   . Arthritis   . Asthma   . Chicken pox   . GERD (gastroesophageal reflux disease)   . Hiatal hernia   . Pigmentary glaucoma of both eyes   . Polyp of stomach   . Shingles   . Thyroid disease   . Urinary incontinence   . Uterine fibroid     Past Surgical History:  Procedure Laterality Date  . CHOLECYSTECTOMY  2002  . KNEE ARTHROSCOPY Left 08/2015  . NASAL SINUS SURGERY  2000    Outpatient Medications Prior to Visit  Medication Sig Dispense Refill  . dorzolamide-timolol (COSOPT) 22.3-6.8 MG/ML ophthalmic solution Place 1 drop into both eyes 2 (two) times daily.     . fluticasone (FLONASE) 50 MCG/ACT nasal spray Place 2 sprays into both nostrils daily. 16 g 6  . Fluticasone-Salmeterol (ADVAIR) 100-50 MCG/DOSE AEPB Inhale 1 puff into the lungs 2 (two) times daily. 1 each 5  . HYDROcodone-acetaminophen (NORCO/VICODIN) 5-325 MG tablet Take 1-2 tablets by mouth every 6 (six) hours as needed for moderate pain. 40 tablet 0  . levocetirizine (XYZAL) 5 MG tablet Take 1 tablet (5 mg total) by mouth every evening. 90 tablet 3  . levothyroxine (SYNTHROID, LEVOTHROID) 25 MCG tablet Take 1 tablet (25 mcg total) by  mouth daily before breakfast. 90 tablet 3  . montelukast (SINGULAIR) 10 MG tablet Take 1 tablet (10 mg total) by mouth at bedtime. (Patient not taking: Reported on 03/07/2018) 90 tablet 3  . pravastatin (PRAVACHOL) 20 MG tablet Take 1 tablet (20 mg total) by mouth daily. (Patient not taking: Reported on 03/07/2018) 90 tablet 3   No facility-administered medications prior to visit.     Allergies  Allergen Reactions  . Percocet [Oxycodone-Acetaminophen] Shortness Of Breath  . Amoxicillin Hives    Has patient had a PCN reaction causing immediate rash, facial/tongue/throat swelling, SOB or lightheadedness with hypotension: Yes Has patient had a PCN reaction causing severe rash involving mucus membranes or skin necrosis: No Has patient had a PCN reaction that required hospitalization No Has patient had a PCN reaction occurring within the last 10 years: Yes  If all of the above answers are "NO", then may proceed with Cephalosporin use.   Marland Kitchen Doxycycline Itching  . Oxycodone   . Demerol [Meperidine] Palpitations    ROS As per HPI  PE: Blood pressure 100/68, pulse 83, temperature 98 F (36.7 C), temperature source Oral, resp. rate 16, height 5\' 2"  (1.575 m), weight 215 lb 2 oz (97.6 kg), SpO2 96 %. VS: noted--normal. Gen: alert, NAD, NONTOXIC APPEARING. HEENT: eyes without injection, drainage, or swelling.  Ears: EACs clear, TMs with  normal light reflex and landmarks.  Nose: Clear rhinorrhea, with some dried, crusty exudate adherent to mildly injected mucosa.  No purulent d/c.  Maxillary and frontal sinus TTP bilat.  No facial swelling.  Throat and mouth without focal lesion.  No pharyngial swelling, erythema, or exudate.   Neck: supple, no LAD.   LUNGS: CTA bilat, nonlabored resps.   CV: RRR, no m/r/g. EXT: no c/c/e SKIN: no rash    LABS:    Chemistry      Component Value Date/Time   NA 137 12/28/2017 1446   K 4.3 12/28/2017 1446   CL 103 12/28/2017 1446   CO2 24 12/28/2017 1446    BUN 12 12/28/2017 1446   CREATININE 0.81 12/28/2017 1446      Component Value Date/Time   CALCIUM 9.2 12/28/2017 1446   ALKPHOS 62 07/11/2017 0407   AST 23 12/28/2017 1446   ALT 31 (H) 12/28/2017 1446   BILITOT 0.4 12/28/2017 1446       IMPRESSION AND PLAN:  Acute, recurrent maxillary sinusitis. Z-pack. Continue current nasal sprays and asthma controller meds. No sign of acute asthma flare.  An After Visit Summary was printed and given to the patient.  FOLLOW UP: Return if symptoms worsen or fail to improve.  Signed:  Crissie Sickles, MD           03/07/2018

## 2018-03-08 NOTE — Progress Notes (Signed)
Office Visit Note  Patient: Cathy Baxter             Date of Birth: 1960/02/16           MRN: [id number]             PCP: Ma Hillock, DO Referring: Ma Hillock, DO Visit Date: 03/22/2018 Occupation: Licensed conveyancer at [employer]: Positive ANA and fatigue   History of Present Illness: Cathy Baxter is a 58 y.o. female seen in consultation for evaluation of fatigue and positive ANA.  Patient states she has been experiencing extreme fatigue and sleepiness during the day.  She states she fell at [facility] while at work a long time ago and acquired some disc issues.  She has been severe lower back pain.  She also has knee injury and had meniscal tear repair on her left knee.  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  She states she has been offered to have left total knee replacement but she has to lose weight prior to that.  She has difficulty walking due to lower back pain and knee joint pain.  She also has been experiencing muscle pain in her bilateral lower extremities.  She has some discomfort in her hands and her feet as well.  She denies any joint swelling.  Activities of Daily Living:  Patient reports morning stiffness for 0 none.   Patient Denies nocturnal pain.  Difficulty dressing/grooming: Denies Difficulty climbing stairs: REPORTS Difficulty getting out of chair: Denies Difficulty using hands for taps, buttons, cutlery, and/or writing: Denies  Review of Systems  Constitutional: Positive for fatigue. Negative for night sweats, weight gain and weight loss.  HENT: Positive for mouth dryness. Negative for mouth sores, trouble swallowing, trouble swallowing and nose dryness.   Eyes: Positive for dryness. Negative for pain, redness and visual disturbance.  Respiratory: Negative for cough, shortness of breath and difficulty breathing.   Cardiovascular: Negative for chest pain, palpitations, hypertension, irregular heartbeat and  swelling in legs/feet.  Gastrointestinal: Positive for constipation. Negative for blood in stool and diarrhea.  Endocrine: Positive for excessive thirst and increased urination. Negative for cold intolerance.  Genitourinary: Negative for difficulty urinating and vaginal dryness.  Musculoskeletal: Positive for myalgias and myalgias. Negative for arthralgias, joint pain, joint swelling, muscle weakness, morning stiffness and muscle tenderness.  Skin: Negative for color change, rash, hair loss, skin tightness, ulcers and sensitivity to sunlight.  Allergic/Immunologic: Negative for susceptible to infections.  Neurological: Positive for numbness. Negative for dizziness, memory loss, night sweats and weakness.  Hematological: Negative for bruising/bleeding tendency and swollen glands.  Psychiatric/Behavioral: Negative for depressed mood and sleep disturbance. The patient is not nervous/anxious.     PMFS History:  Patient Active Problem List   Diagnosis Date Noted  . Non-seasonal allergic rhinitis due to pollen 02/07/2018  . Other meniscus derangements, posterior horn of medial meniscus, left knee 11/24/2017  . Obesity (BMI 30-39.9) 02/20/2016  . Cataract of both eyes 02/20/2016  . Pigmentary glaucoma of both eyes   . Hiatal hernia 11/06/2015  . Hyperlipidemia 11/06/2015  . Asthma 11/06/2015  . Uterine fibroids in pregnancy, postpartum condition 11/06/2015  . Hypothyroidism 10/17/2015    Past Medical History:  Diagnosis Date  . Allergy   . Anemia   . Arthritis   . Asthma   . Chicken pox   . GERD (gastroesophageal reflux disease)   . Hiatal hernia   . Pigmentary glaucoma of both eyes   .  Polyp of stomach   . Shingles   . Thyroid disease   . Urinary incontinence   . Uterine fibroid     Family History  Problem Relation Age of Onset  . Heart disease Mother   . Early death Father   . Cancer Son 10       Neuroepithelioma   . Breast cancer Sister 36   Past Surgical History:    Procedure Laterality Date  . CHOLECYSTECTOMY  2002  . EYE SURGERY    . KNEE ARTHROSCOPY Left 08/2015  . NASAL SINUS SURGERY  2000   Social History   Social History Narrative   Divorced. She has had 3 children, one passed away young from cancer.    11th grade education. Works at [employer].    Former smoker.   Drinks caffeine.    Wears seatbelt, smoke detector in the home.    Firearms in the home.    Feels safe in her relationships.     There is no immunization history on file for this patient.   Objective: Vital Signs: BP 108/69 (BP Location: Right Arm, Patient Position: Sitting, Cuff Size: Normal)   Pulse 71   Resp 16   Ht 5' 2.5" (1.588 m)   Wt 214 lb (97.1 kg)   BMI 38.52 kg/m    Physical Exam Vitals signs and nursing note reviewed.  Constitutional:      Appearance: She is well-developed.  HENT:     Head: Normocephalic and atraumatic.  Eyes:     Conjunctiva/sclera: Conjunctivae normal.  Neck:     Musculoskeletal: Normal range of motion.  Cardiovascular:     Rate and Rhythm: Normal rate and regular rhythm.     Heart sounds: Normal heart sounds.  Pulmonary:     Effort: Pulmonary effort is normal.     Breath sounds: Normal breath sounds.  Abdominal:     General: Bowel sounds are normal.     Palpations: Abdomen is soft.  Lymphadenopathy:     Cervical: No cervical adenopathy.  Skin:    General: Skin is warm and dry.     Capillary Refill: Capillary refill takes less than 2 seconds.  Neurological:     Mental Status: She is alert and oriented to person, place, and time.  Psychiatric:        Behavior: Behavior normal.      Musculoskeletal Exam: C-spine good range of motion.  She has limited painful range of motion of her lumbar spine.  She walks with a limp.  She has good range of motion of her shoulders elbow joints wrist joints.  She has DIP thickening in her bilateral hands consistent with osteoarthritis.  Hip joints knee joints ankles MTPs PIPs been good range  of motion.  She is crepitus and discomfort range of motion of her bilateral knee joints without any warmth swelling or effusion.  CDAI Exam: CDAI Score: Not documented Patient Global Assessment: Not documented; Provider Global Assessment: Not documented Swollen: Not documented; Tender: Not documented Joint Exam   Not documented   There is currently no information documented on the homunculus. Go to the Rheumatology activity and complete the homunculus joint exam.  Investigation: Findings:  02/03/18: ANA 1:40 NH, ENA negative, Hep panel negative, HIV-, TSH 1.02, Vitamin D 35, iron 108, TIBC 330, %sat 33, Ferritin 178  Component     Latest Ref Rng & Units 02/03/2018  Anti Nuclear Antibody(ANA)     NEGATIVE POSITIVE (A)  ds DNA Ab  IU/mL <1  Scleroderma (Scl-70) (ENA) Antibody, IgG     <1.0 NEG AI <1.0 NEG  ENA SM Ab Ser-aCnc     <1.0 NEG AI <1.0 NEG  SM/RNP     <1.0 NEG AI <1.0 NEG  SSA (Ro) (ENA) Antibody, IgG     <1.0 NEG AI <1.0 NEG  SSB (La) (ENA) Antibody, IgG     <1.0 NEG AI <1.0 NEG  Hep A Ab, IgM     NON-REACTI NON-REACTIVE  Hepatitis B Surface Ag     NON-REACTI NON-REACTIVE  Hep B Core Ab, IgM     NON-REACTI NON-REACTIVE  Hepatitis C Ab     NON-REACTI NON-REACTIVE  SIGNAL TO CUT-OFF     <1.00 0.01  Iron     45 - 160 mcg/dL 108  TIBC     250 - 450 mcg/dL (calc) 330  %SAT     16 - 45 % (calc) 33  Ferritin     16 - 232 ng/mL 178  ANA Titer 1     titer 1:40 (H)  ANA Pattern 1      Nuclear, Homogeneous (A)  TSH     0.40 - 4.50 mIU/L 1.02  Vitamin D, 25-Hydroxy     30 - 100 ng/mL 35  HIV     NON-REACTI NON-REACTIVE   Imaging: Ct Abdomen Pelvis W Contrast  Result Date: 03/16/2018 CLINICAL DATA:  Abdominal pain and tenderness EXAM: CT ABDOMEN AND PELVIS WITH CONTRAST TECHNIQUE: Multidetector CT imaging of the abdomen and pelvis was performed using the standard protocol following bolus administration of intravenous contrast. CONTRAST:  111mL OMNIPAQUE  IOHEXOL 300 MG/ML  SOLN COMPARISON:  None. FINDINGS: Lower chest: No acute abnormality. Hepatobiliary: Mild fatty infiltration of the liver is noted. The gallbladder has been surgically removed. Pancreas: Unremarkable. No pancreatic ductal dilatation or surrounding inflammatory changes. Spleen: Normal in size without focal abnormality. Adrenals/Urinary Tract: Adrenal glands are within normal limits bilaterally. Kidneys demonstrate a normal enhancement pattern. No obstructive changes are seen. No renal calculi are noted. Delayed images demonstrate normal excretion of contrast. Bladder is partially distended. Stomach/Bowel: Stomach is within normal limits. Appendix appears normal. No evidence of bowel wall thickening, distention, or inflammatory changes. Vascular/Lymphatic: No significant vascular findings are present. No enlarged abdominal or pelvic lymph nodes. Reproductive: Uterus and bilateral adnexa are unremarkable. Other: No abdominal wall hernia or abnormality. No abdominopelvic ascites. Musculoskeletal: Degenerative changes of the lumbar spine are within normal limits. IMPRESSION: Chronic changes without acute abnormality. Electronically Signed   By: Inez Catalina M.D.   On: 03/16/2018 12:46    Recent Labs: Lab Results  Component Value Date   WBC 8.0 03/16/2018   HGB 13.2 03/16/2018   PLT 390 03/16/2018   NA 137 03/16/2018   K 4.7 03/16/2018   CL 107 03/16/2018   CO2 23 03/16/2018   GLUCOSE 96 03/16/2018   BUN 13 03/16/2018   CREATININE 0.86 03/16/2018   BILITOT 0.6 03/16/2018   ALKPHOS 63 03/16/2018   AST 31 03/16/2018   ALT 30 03/16/2018   PROT 7.5 03/16/2018   ALBUMIN 3.8 03/16/2018   CALCIUM 9.0 03/16/2018   GFRAA >60 03/16/2018    Speciality Comments: No specialty comments available.  Procedures:  No procedures performed Allergies: Percocet [oxycodone-acetaminophen]; Amoxicillin; Doxycycline; Oxycodone; and Demerol [meperidine]   Assessment / Plan:     Visit Diagnoses:  Positive ANA (antinuclear antibody) -patient has low titer positive ANA.  She has no other clinical features of autoimmune disease on  examination.  She gives history of sicca symptoms.  Her main concern is fatigue.  I will obtain additional antibodies today.  02/03/18: ANA 1:40 NH, ENA negative, Hep panel negative, HIV-, TSH 1.02, Vitamin D 35, iron 108, TIBC 330, %sat 33, Ferritin 178 - Plan: Anti-scleroderma antibody, RNP Antibody, Anti-Smith antibody, Sjogrens syndrome-A extractable nuclear antibody, Sjogrens syndrome-B extractable nuclear antibody, Anti-DNA antibody, double-stranded, C3 and C4  Other fatigue -patient complains of increased fatigue and excessive drowsiness.  Plan: Epstein-Barr virus VCA antibody panel  Chronic midline low back pain without sciatica-she has limited range of motion and lower back discomfort.  According to patient she has some disc issues in her back and for which she is seeing a physician.  Chronic pain of both knees-according to patient she has severe osteoarthritis in her knee joints and she will require left total knee replacement.  Have given her a handout on knee exercises.  Primary osteoarthritis of both hands-clinical findings were consistent with osteoarthritis.  She had no synovitis.  Other meniscus derangements, posterior horn of medial meniscus, left knee  BMI 38.52-patient is trying intentional weight loss.  History of gastroesophageal reflux (GERD)  History of asthma  History of hyperlipidemia  History of hypothyroidism  Pigmentary glaucoma of both eyes, unspecified glaucoma stage  Cataract of both eyes, unspecified cataract type - s/p surgery   Orders: Orders Placed This Encounter  Procedures  . Anti-scleroderma antibody  . RNP Antibody  . Anti-Smith antibody  . Sjogrens syndrome-A extractable nuclear antibody  . Sjogrens syndrome-B extractable nuclear antibody  . Anti-DNA antibody, double-stranded  . C3 and C4  . Epstein-Barr virus  VCA antibody panel   No orders of the defined types were placed in this encounter.   Face-to-face time spent with patient was 50 minutes. Greater than 50% of time was spent in counseling and coordination of care.  Follow-Up Instructions: Return for Fatigue, positive ANA.   Bo Merino, MD  Note - This record has been created using Editor, commissioning.  Chart creation errors have been sought, but may not always  have been located. Such creation errors do not reflect on  the standard of medical care.

## 2018-03-10 ENCOUNTER — Telehealth (INDEPENDENT_AMBULATORY_CARE_PROVIDER_SITE_OTHER): Payer: Self-pay | Admitting: *Deleted

## 2018-03-10 NOTE — Telephone Encounter (Signed)
Hope with [facility] called stating pt came in for evaluation for aquatic therapy on 03/09/18 at 3p.

## 2018-03-16 ENCOUNTER — Encounter (HOSPITAL_BASED_OUTPATIENT_CLINIC_OR_DEPARTMENT_OTHER): Payer: Self-pay | Admitting: Emergency Medicine

## 2018-03-16 ENCOUNTER — Other Ambulatory Visit: Payer: Self-pay

## 2018-03-16 ENCOUNTER — Emergency Department (HOSPITAL_BASED_OUTPATIENT_CLINIC_OR_DEPARTMENT_OTHER)
Admission: EM | Admit: 2018-03-16 | Discharge: 2018-03-16 | Disposition: A | Discharge status: Home / Self Care | Payer: BLUE CROSS/BLUE SHIELD | Attending: Emergency Medicine | Admitting: Emergency Medicine

## 2018-03-16 ENCOUNTER — Emergency Department (HOSPITAL_BASED_OUTPATIENT_CLINIC_OR_DEPARTMENT_OTHER): Payer: BLUE CROSS/BLUE SHIELD

## 2018-03-16 DIAGNOSIS — Z87891 Personal history of nicotine dependence: Secondary | ICD-10-CM | POA: Insufficient documentation

## 2018-03-16 DIAGNOSIS — R1084 Generalized abdominal pain: Secondary | ICD-10-CM | POA: Diagnosis not present

## 2018-03-16 DIAGNOSIS — Z79899 Other long term (current) drug therapy: Secondary | ICD-10-CM | POA: Diagnosis not present

## 2018-03-16 DIAGNOSIS — R252 Cramp and spasm: Secondary | ICD-10-CM | POA: Diagnosis not present

## 2018-03-16 DIAGNOSIS — J45909 Unspecified asthma, uncomplicated: Secondary | ICD-10-CM | POA: Diagnosis not present

## 2018-03-16 DIAGNOSIS — R110 Nausea: Secondary | ICD-10-CM | POA: Diagnosis not present

## 2018-03-16 DIAGNOSIS — E039 Hypothyroidism, unspecified: Secondary | ICD-10-CM | POA: Insufficient documentation

## 2018-03-16 DIAGNOSIS — R1010 Upper abdominal pain, unspecified: Secondary | ICD-10-CM | POA: Diagnosis present

## 2018-03-16 LAB — CBC
HCT: 40.8 % (ref 36.0–46.0)
Hemoglobin: 13.2 g/dL (ref 12.0–15.0)
MCH: 32.8 pg (ref 26.0–34.0)
MCHC: 32.4 g/dL (ref 30.0–36.0)
MCV: 101.2 fL — ABNORMAL HIGH (ref 80.0–100.0)
Platelets: 390 10*3/uL (ref 150–400)
RBC: 4.03 MIL/uL (ref 3.87–5.11)
RDW: 11.8 % (ref 11.5–15.5)
WBC: 8 10*3/uL (ref 4.0–10.5)
nRBC: 0 % (ref 0.0–0.2)

## 2018-03-16 LAB — COMPREHENSIVE METABOLIC PANEL
ALT: 30 U/L (ref 0–44)
AST: 31 U/L (ref 15–41)
Albumin: 3.8 g/dL (ref 3.5–5.0)
Alkaline Phosphatase: 63 U/L (ref 38–126)
Anion gap: 7 (ref 5–15)
BUN: 13 mg/dL (ref 6–20)
CO2: 23 mmol/L (ref 22–32)
Calcium: 9 mg/dL (ref 8.9–10.3)
Chloride: 107 mmol/L (ref 98–111)
Creatinine, Ser: 0.86 mg/dL (ref 0.44–1.00)
GFR calc Af Amer: >60 mL/min (ref >60)
GFR calc non Af Amer: >60 mL/min (ref >60)
Glucose, Bld: 96 mg/dL (ref 70–99)
Potassium: 4.7 mmol/L (ref 3.5–5.1)
Sodium: 137 mmol/L (ref 135–145)
Total Bilirubin: 0.6 mg/dL (ref 0.3–1.2)
Total Protein: 7.5 g/dL (ref 6.5–8.1)

## 2018-03-16 LAB — URINALYSIS, ROUTINE W REFLEX MICROSCOPIC
Bilirubin Urine: NEGATIVE
Glucose, UA: NEGATIVE mg/dL
Hgb urine dipstick: NEGATIVE
Ketones, ur: NEGATIVE mg/dL
Leukocytes,Ua: NEGATIVE
Nitrite: NEGATIVE
Protein, ur: NEGATIVE mg/dL
Specific Gravity, Urine: <1.005 — ABNORMAL LOW (ref 1.005–1.030)
pH: 6 (ref 5.0–8.0)

## 2018-03-16 LAB — LIPASE, BLOOD: Lipase: 29 U/L (ref 11–51)

## 2018-03-16 LAB — PREGNANCY, URINE: Preg Test, Ur: NEGATIVE

## 2018-03-16 MED ORDER — ALUM & MAG HYDROXIDE-SIMETH 200-200-20 MG/5ML PO SUSP
30.0000 mL | Freq: Once | ORAL | Status: AC
  Administered 2018-03-16: 30 mL via ORAL
  Filled 2018-03-16: qty 30

## 2018-03-16 MED ORDER — SODIUM CHLORIDE 0.9% FLUSH
3.0000 mL | Freq: Once | INTRAVENOUS | Status: DC
  Filled 2018-03-16: qty 3

## 2018-03-16 MED ORDER — FAMOTIDINE 40 MG PO TABS: 40.0000 mg | ORAL_TABLET | Freq: Every day | ORAL | 0 refills | Status: DC

## 2018-03-16 MED ORDER — DICYCLOMINE HCL 10 MG PO CAPS
10.0000 mg | ORAL_CAPSULE | Freq: Once | ORAL | Status: AC
  Administered 2018-03-16: 10 mg via ORAL
  Filled 2018-03-16: qty 1

## 2018-03-16 MED ORDER — ONDANSETRON HCL 4 MG/2ML IJ SOLN
4.0000 mg | Freq: Once | INTRAMUSCULAR | Status: AC
  Administered 2018-03-16: 4 mg via INTRAVENOUS
  Filled 2018-03-16: qty 2

## 2018-03-16 MED ORDER — IOHEXOL 300 MG/ML  SOLN
100.0000 mL | Freq: Once | INTRAMUSCULAR | Status: AC | PRN
  Administered 2018-03-16: 100 mL via INTRAVENOUS

## 2018-03-16 NOTE — ED Triage Notes (Addendum)
Reports vomiting and lower abdominal cramping which radiates into her legs.  States this began this morning.  Denies diarrhea, dysuria, hematuria, vaginal discharge, odor.  Recently finished abx for sinus infection.

## 2018-03-16 NOTE — ED Notes (Signed)
Patient transported to CT 

## 2018-03-16 NOTE — ED Provider Notes (Signed)
[facility] EMERGENCY DEPARTMENT Provider Note   CSN: [id number] Arrival date & time: 03/16/18  1017    History   Chief Complaint Chief Complaint  Patient presents with  . Abdominal Pain    HPI Cathy Baxter is a 58 y.o. female.     58yo F w/ PMH including GERD, HLD who p/w abdominal pain. This morning around 7am, she began having central upper abdominal pain that has been constant, severe, stabbing, and associated with leg cramps and nausea. No vomiting or diarrhea. No urinary symptoms, vaginal bleeding/discharge, fever, recent travel, or URI symptoms. She reports sinus congestion, recently finished antibiotics for it. No heavy alcohol or NSAID use. No CP or SOB.   The history is provided by the patient.  Abdominal Pain    Past Medical History:  Diagnosis Date  . Allergy   . Anemia   . Arthritis   . Asthma   . Chicken pox   . GERD (gastroesophageal reflux disease)   . Hiatal hernia   . Pigmentary glaucoma of both eyes   . Polyp of stomach   . Shingles   . Thyroid disease   . Urinary incontinence   . Uterine fibroid     Patient Active Problem List   Diagnosis Date Noted  . Non-seasonal allergic rhinitis due to pollen 02/07/2018  . Other meniscus derangements, posterior horn of medial meniscus, left knee 11/24/2017  . Obesity (BMI 30-39.9) 02/20/2016  . Cataract of both eyes 02/20/2016  . Pigmentary glaucoma of both eyes   . Hiatal hernia 11/06/2015  . Hyperlipidemia 11/06/2015  . Asthma 11/06/2015  . Uterine fibroids in pregnancy, postpartum condition 11/06/2015  . Hypothyroidism 10/17/2015    Past Surgical History:  Procedure Laterality Date  . CHOLECYSTECTOMY  2002  . KNEE ARTHROSCOPY Left 08/2015  . NASAL SINUS SURGERY  2000     OB History    Gravida  3   Para  3   Term  3   Preterm      AB      Living  2     SAB      TAB      Ectopic      Multiple      Live Births               Home Medications    Prior to  Admission medications   Medication Sig Start Date End Date Taking? Authorizing Provider  azithromycin (ZITHROMAX) 250 MG tablet 2 tabs po qd x 1d, then 1 tab po qd x 4d 03/07/18   McGowen, Adrian Blackwater, MD  dorzolamide-timolol (COSOPT) 22.3-6.8 MG/ML ophthalmic solution Place 1 drop into both eyes 2 (two) times daily.  09/18/15   [provider]  famotidine (PEPCID) 40 MG tablet Take 1 tablet (40 mg total) by mouth daily. 03/16/18   Grae Leathers, Wenda Overland, MD  fluticasone (FLONASE) 50 MCG/ACT nasal spray Place 2 sprays into both nostrils daily. 05/09/15   Brayton Caves, PA-C  Fluticasone-Salmeterol (ADVAIR) 100-50 MCG/DOSE AEPB Inhale 1 puff into the lungs 2 (two) times daily. 02/03/18   Kuneff, Renee A, DO  HYDROcodone-acetaminophen (NORCO/VICODIN) 5-325 MG tablet Take 1-2 tablets by mouth every 6 (six) hours as needed for moderate pain. 01/26/18   Mcarthur Rossetti, MD  levocetirizine (XYZAL) 5 MG tablet Take 1 tablet (5 mg total) by mouth every evening. 02/03/18   Kuneff, Renee A, DO  levothyroxine (SYNTHROID, LEVOTHROID) 25 MCG tablet Take 1 tablet (25 mcg total) by  mouth daily before breakfast. 02/10/17   Kuneff, Renee A, DO  montelukast (SINGULAIR) 10 MG tablet Take 1 tablet (10 mg total) by mouth at bedtime. Patient not taking: Reported on 03/07/2018 02/03/18   Howard Pouch A, DO  pravastatin (PRAVACHOL) 20 MG tablet Take 1 tablet (20 mg total) by mouth daily. Patient not taking: Reported on 03/07/2018 12/29/17   Ma Hillock, DO    Family History Family History  Problem Relation Age of Onset  . Heart disease Mother   . Early death Father   . Cancer Son 10       Neuroepithelioma   . Breast cancer Sister 70    Social History Social History   Tobacco Use  . Smoking status: Former Smoker    Types: Cigarettes    Last attempt to quit: 07/12/1983    Years since quitting: 34.7  . Smokeless tobacco: Never Used  Substance Use Topics  . Alcohol use: Yes    Comment: social  . Drug  use: No     Allergies   Percocet [oxycodone-acetaminophen]; Amoxicillin; Doxycycline; Oxycodone; and Demerol [meperidine]   Review of Systems Review of Systems  Gastrointestinal: Positive for abdominal pain.   All other systems reviewed and are negative except that which was mentioned in HPI   Physical Exam Updated Vital Signs BP 132/81 (BP Location: Right Arm)   Pulse 67   Temp 97.8 F (36.6 C) (Oral)   Resp 16   Ht 5\' 2"  (1.575 m)   Wt 97.6 kg   SpO2 100%   BMI 39.35 kg/m   Physical Exam Vitals signs and nursing note reviewed.  Constitutional:      General: She is not in acute distress.    Appearance: She is well-developed.  HENT:     Head: Normocephalic and atraumatic.     Mouth/Throat:     Mouth: Mucous membranes are moist.     Pharynx: Oropharynx is clear.  Eyes:     Conjunctiva/sclera: Conjunctivae normal.  Neck:     Musculoskeletal: Neck supple.  Cardiovascular:     Rate and Rhythm: Normal rate and regular rhythm.     Heart sounds: Normal heart sounds. No murmur.  Pulmonary:     Effort: Pulmonary effort is normal.     Breath sounds: Normal breath sounds.  Abdominal:     General: Bowel sounds are normal. There is no distension.     Palpations: Abdomen is soft.     Tenderness: There is abdominal tenderness in the right lower quadrant, epigastric area, suprapubic area and left lower quadrant. There is no guarding or rebound.     Hernia: No hernia is present.  Skin:    General: Skin is warm and dry.  Neurological:     Mental Status: She is alert and oriented to person, place, and time.     Comments: Fluent speech  Psychiatric:        Mood and Affect: Mood normal.        Judgment: Judgment normal.      ED Treatments / Results  Labs (all labs ordered are listed, but only abnormal results are displayed) Labs Reviewed  CBC - Abnormal; Notable for the following components:      Result Value   MCV 101.2 (*)    All other components within normal  limits  URINALYSIS, ROUTINE W REFLEX MICROSCOPIC - Abnormal; Notable for the following components:   Specific Gravity, Urine <1.005 (*)    All other components within normal limits  LIPASE, BLOOD  COMPREHENSIVE METABOLIC PANEL  PREGNANCY, URINE    EKG None  Radiology Ct Abdomen Pelvis W Contrast  Result Date: 03/16/2018 CLINICAL DATA:  Abdominal pain and tenderness EXAM: CT ABDOMEN AND PELVIS WITH CONTRAST TECHNIQUE: Multidetector CT imaging of the abdomen and pelvis was performed using the standard protocol following bolus administration of intravenous contrast. CONTRAST:  159mL OMNIPAQUE IOHEXOL 300 MG/ML  SOLN COMPARISON:  None. FINDINGS: Lower chest: No acute abnormality. Hepatobiliary: Mild fatty infiltration of the liver is noted. The gallbladder has been surgically removed. Pancreas: Unremarkable. No pancreatic ductal dilatation or surrounding inflammatory changes. Spleen: Normal in size without focal abnormality. Adrenals/Urinary Tract: Adrenal glands are within normal limits bilaterally. Kidneys demonstrate a normal enhancement pattern. No obstructive changes are seen. No renal calculi are noted. Delayed images demonstrate normal excretion of contrast. Bladder is partially distended. Stomach/Bowel: Stomach is within normal limits. Appendix appears normal. No evidence of bowel wall thickening, distention, or inflammatory changes. Vascular/Lymphatic: No significant vascular findings are present. No enlarged abdominal or pelvic lymph nodes. Reproductive: Uterus and bilateral adnexa are unremarkable. Other: No abdominal wall hernia or abnormality. No abdominopelvic ascites. Musculoskeletal: Degenerative changes of the lumbar spine are within normal limits. IMPRESSION: Chronic changes without acute abnormality. Electronically Signed   By: Inez Catalina M.D.   On: 03/16/2018 12:46    Procedures Procedures (including critical care time)  Medications Ordered in ED Medications  sodium chloride  flush (NS) 0.9 % injection 3 mL (3 mLs Intravenous Not Given 03/16/18 1054)  ondansetron (ZOFRAN) injection 4 mg (4 mg Intravenous Given 03/16/18 1153)  iohexol (OMNIPAQUE) 300 MG/ML solution 100 mL (100 mLs Intravenous Contrast Given 03/16/18 1204)  dicyclomine (BENTYL) capsule 10 mg (10 mg Oral Given 03/16/18 1302)  alum & mag hydroxide-simeth (MAALOX/MYLANTA) 200-200-20 MG/5ML suspension 30 mL (30 mLs Oral Given 03/16/18 1302)     Initial Impression / Assessment and Plan / ED Course  I have reviewed the triage vital signs and the nursing notes.  Pertinent labs & imaging results that were available during my care of the patient were reviewed by me and considered in my medical decision making (see chart for details).        Nontoxic on exam with reassuring vital signs.  She had generalized tenderness including across lower abdomen as well as mid epigastrium.  Differential includes diverticulitis, partial bowel obstruction, less likely biliary pathology as her LFTs and lipase are normal.  CBC also normal.  Discussed options and patient wanted to proceed with CT scan.  CT shows no acute findings.  Patient drinking ginger ale and comfortable on reassessment.  Discussed supportive measures including bland diet and follow-up with PCP for reassessment.  Advised that she may need further work-up if symptoms persist.  Will start on H2 blocker for now.  Extensively reviewed return precautions and she voiced understanding.  Final Clinical Impressions(s) / ED Diagnoses   Final diagnoses:  Generalized abdominal pain    ED Discharge Orders         Ordered    famotidine (PEPCID) 40 MG tablet  Daily     03/16/18 1350           Briea Mcenery, Wenda Overland, MD 03/16/18 1351

## 2018-03-16 NOTE — ED Notes (Signed)
Given ginger ale 

## 2018-03-20 ENCOUNTER — Other Ambulatory Visit: Payer: Self-pay | Admitting: Family Medicine

## 2018-03-20 ENCOUNTER — Telehealth (INDEPENDENT_AMBULATORY_CARE_PROVIDER_SITE_OTHER): Payer: Self-pay | Admitting: Orthopaedic Surgery

## 2018-03-20 NOTE — Telephone Encounter (Signed)
Pt called in said she needs a note for February 25th the day after her appt that Dr.Blackman put her out for, needs the note to provide to her employer.  [phone]

## 2018-03-21 ENCOUNTER — Encounter (INDEPENDENT_AMBULATORY_CARE_PROVIDER_SITE_OTHER): Payer: Self-pay

## 2018-03-21 NOTE — Telephone Encounter (Signed)
Patient aware note at front desk  

## 2018-03-22 ENCOUNTER — Other Ambulatory Visit: Payer: Self-pay

## 2018-03-22 ENCOUNTER — Encounter: Payer: Self-pay | Admitting: Rheumatology

## 2018-03-22 ENCOUNTER — Ambulatory Visit: Payer: BLUE CROSS/BLUE SHIELD | Admitting: Rheumatology

## 2018-03-22 VITALS — BP 108/69 | HR 71 | Resp 16 | Ht 62.5 in | Wt 214.0 lb

## 2018-03-22 DIAGNOSIS — Z8709 Personal history of other diseases of the respiratory system: Secondary | ICD-10-CM

## 2018-03-22 DIAGNOSIS — R5383 Other fatigue: Secondary | ICD-10-CM | POA: Diagnosis not present

## 2018-03-22 DIAGNOSIS — M19041 Primary osteoarthritis, right hand: Secondary | ICD-10-CM

## 2018-03-22 DIAGNOSIS — H269 Unspecified cataract: Secondary | ICD-10-CM

## 2018-03-22 DIAGNOSIS — Z8719 Personal history of other diseases of the digestive system: Secondary | ICD-10-CM

## 2018-03-22 DIAGNOSIS — Z6838 Body mass index (BMI) 38.0-38.9, adult: Secondary | ICD-10-CM

## 2018-03-22 DIAGNOSIS — M545 Low back pain, unspecified: Secondary | ICD-10-CM

## 2018-03-22 DIAGNOSIS — M25562 Pain in left knee: Secondary | ICD-10-CM

## 2018-03-22 DIAGNOSIS — M25561 Pain in right knee: Secondary | ICD-10-CM | POA: Diagnosis not present

## 2018-03-22 DIAGNOSIS — R768 Other specified abnormal immunological findings in serum: Secondary | ICD-10-CM

## 2018-03-22 DIAGNOSIS — Z8639 Personal history of other endocrine, nutritional and metabolic disease: Secondary | ICD-10-CM

## 2018-03-22 DIAGNOSIS — H401330 Pigmentary glaucoma, bilateral, stage unspecified: Secondary | ICD-10-CM

## 2018-03-22 DIAGNOSIS — M23322 Other meniscus derangements, posterior horn of medial meniscus, left knee: Secondary | ICD-10-CM

## 2018-03-22 DIAGNOSIS — G8929 Other chronic pain: Secondary | ICD-10-CM

## 2018-03-22 DIAGNOSIS — M19042 Primary osteoarthritis, left hand: Secondary | ICD-10-CM

## 2018-03-22 NOTE — Patient Instructions (Signed)

## 2018-03-23 LAB — EPSTEIN-BARR VIRUS VCA ANTIBODY PANEL
EBV NA IgG: 567 U/mL — ABNORMAL HIGH
EBV VCA IgG: 591 U/mL — ABNORMAL HIGH
EBV VCA IgM: <36 U/mL

## 2018-03-23 LAB — SJOGRENS SYNDROME-A EXTRACTABLE NUCLEAR ANTIBODY: SSA (Ro) (ENA) Antibody, IgG: <1 AI

## 2018-03-23 LAB — SJOGRENS SYNDROME-B EXTRACTABLE NUCLEAR ANTIBODY: SSB (La) (ENA) Antibody, IgG: <1 AI

## 2018-03-23 LAB — ANTI-DNA ANTIBODY, DOUBLE-STRANDED: ds DNA Ab: <1 IU/mL

## 2018-03-23 LAB — C3 AND C4
C3 Complement: 159 mg/dL (ref 83–193)
C4 Complement: 30 mg/dL (ref 15–57)

## 2018-03-23 LAB — ANTI-SMITH ANTIBODY: ENA SM Ab Ser-aCnc: <1 AI

## 2018-03-23 LAB — RNP ANTIBODY: Ribonucleic Protein(ENA) Antibody, IgG: <1 AI

## 2018-03-23 LAB — ANTI-SCLERODERMA ANTIBODY: Scleroderma (Scl-70) (ENA) Antibody, IgG: <1 AI

## 2018-03-23 NOTE — Progress Notes (Signed)
EBV - past infection.

## 2018-03-27 ENCOUNTER — Telehealth: Payer: Self-pay | Admitting: *Deleted

## 2018-03-27 NOTE — Telephone Encounter (Signed)
Patient advised of lab results. Patient advised will discuss in detail at appointment on 04/12/18. Patient verbalized understanding.

## 2018-04-04 ENCOUNTER — Telehealth (INDEPENDENT_AMBULATORY_CARE_PROVIDER_SITE_OTHER): Payer: Self-pay

## 2018-04-04 NOTE — Telephone Encounter (Signed)
Called patient, answered no to all prescreening questions  

## 2018-04-05 ENCOUNTER — Encounter (INDEPENDENT_AMBULATORY_CARE_PROVIDER_SITE_OTHER): Payer: Self-pay | Admitting: Orthopaedic Surgery

## 2018-04-05 ENCOUNTER — Ambulatory Visit (INDEPENDENT_AMBULATORY_CARE_PROVIDER_SITE_OTHER): Payer: Worker's Compensation | Admitting: Orthopaedic Surgery

## 2018-04-05 ENCOUNTER — Other Ambulatory Visit: Payer: Self-pay

## 2018-04-05 DIAGNOSIS — Z9889 Other specified postprocedural states: Secondary | ICD-10-CM

## 2018-04-05 DIAGNOSIS — G8929 Other chronic pain: Secondary | ICD-10-CM

## 2018-04-05 DIAGNOSIS — M5441 Lumbago with sciatica, right side: Secondary | ICD-10-CM | POA: Diagnosis not present

## 2018-04-05 NOTE — Progress Notes (Signed)
The patient is to continue to follow-up status post a left knee arthroscopy due to a work-related injury of her left knee.  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  All this is related to a work-related fall.  She has been going to therapy but not a lot lately due to the coronavirus pandemic.  She also someone who has asthma and lupus and that puts her at higher risk.  She still reports on and off pain in her left knee and right sciatic pain.  At this point I am at a standstill what we can do for her other than recommending continue sedentary work for the next 3 months as were dealing with the pandemic.  Also if she can get a functional capacity evaluation at this point it would be helpful in determining what her final disability will be.

## 2018-04-10 DIAGNOSIS — M19042 Primary osteoarthritis, left hand: Secondary | ICD-10-CM | POA: Insufficient documentation

## 2018-04-10 DIAGNOSIS — M19041 Primary osteoarthritis, right hand: Secondary | ICD-10-CM | POA: Insufficient documentation

## 2018-04-10 DIAGNOSIS — M5136 Other intervertebral disc degeneration, lumbar region: Secondary | ICD-10-CM | POA: Insufficient documentation

## 2018-04-10 DIAGNOSIS — M170 Bilateral primary osteoarthritis of knee: Secondary | ICD-10-CM | POA: Insufficient documentation

## 2018-04-10 NOTE — Progress Notes (Signed)
March 22, 2018 EBV titer consistent with past infection, ENA negative, C3-C4 normal  02/03/18: ANA 1:40 NH, ENA negative, Hep panel negative, HIV-, TSH 1.02, Vitamin D 35, iron 108, TIBC 330, %sat 33, Ferritin 178

## 2018-04-11 ENCOUNTER — Ambulatory Visit (INDEPENDENT_AMBULATORY_CARE_PROVIDER_SITE_OTHER): Payer: BLUE CROSS/BLUE SHIELD | Admitting: Family Medicine

## 2018-04-11 ENCOUNTER — Ambulatory Visit: Payer: Self-pay | Admitting: *Deleted

## 2018-04-11 ENCOUNTER — Other Ambulatory Visit: Payer: Self-pay

## 2018-04-11 ENCOUNTER — Telehealth (INDEPENDENT_AMBULATORY_CARE_PROVIDER_SITE_OTHER): Payer: Self-pay

## 2018-04-11 ENCOUNTER — Encounter: Payer: Self-pay | Admitting: Family Medicine

## 2018-04-11 VITALS — Temp 98.2°F

## 2018-04-11 DIAGNOSIS — J4541 Moderate persistent asthma with (acute) exacerbation: Secondary | ICD-10-CM

## 2018-04-11 DIAGNOSIS — J301 Allergic rhinitis due to pollen: Secondary | ICD-10-CM

## 2018-04-11 MED ORDER — AZITHROMYCIN 250 MG PO TABS: ORAL_TABLET | ORAL | 0 refills | Status: DC

## 2018-04-11 NOTE — Telephone Encounter (Signed)
Patient has asthma. She is complaining of wheezing and SOB. Patient has had wheezing for 1 week. Patient has been using Advair- 2 times a day. Patient has not had to use Albuterol. Patient has fatigue.Patient is using her vitamins as instructed. Call to office to see if patient can get E visit scheduled.  Reason for Disposition . [1] MILD difficulty breathing (e.g., minimal/no SOB at rest, SOB with walking, pulse <100) AND [2] NEW-onset or WORSE than normal  Answer Assessment - Initial Assessment Questions 1. RESPIRATORY STATUS: "Describe your breathing?" (e.g., wheezing, shortness of breath, unable to speak, severe coughing)      Wheezing and SOB- chest tightness- patient states wheezing comes and goes- inhaler helps. Chest feels tight in front and back- constant, cough comes and goes 2. ONSET: "When did this breathing problem begin?"      1 week 3. PATTERN "Does the difficult breathing come and go, or has it been constant since it started?"      Tightness in constant 4. SEVERITY: "How bad is your breathing?" (e.g., mild, moderate, severe)    - MILD: No SOB at rest, mild SOB with walking, speaks normally in sentences, can lay down, no retractions, pulse < 100.    - MODERATE: SOB at rest, SOB with minimal exertion and prefers to sit, cannot lie down flat, speaks in phrases, mild retractions, audible wheezing, pulse 100-120.    - SEVERE: Very SOB at rest, speaks in single words, struggling to breathe, sitting hunched forward, retractions, pulse > 120      mild 5. RECURRENT SYMPTOM: "Have you had difficulty breathing before?" If so, ask: "When was the last time?" and "What happened that time?"      Last bad asthma attack was 4-5 years ago- breathing treatmant 6. CARDIAC HISTORY: "Do you have any history of heart disease?" (e.g., heart attack, angina, bypass surgery, angioplasty)      no 7. LUNG HISTORY: "Do you have any history of lung disease?"  (e.g., pulmonary embolus, asthma, emphysema)  asthma 8. CAUSE: "What do you think is causing the breathing problem?"      Asthma and allergy 9. OTHER SYMPTOMS: "Do you have any other symptoms? (e.g., dizziness, runny nose, cough, chest pain, fever)     Some congestion symptoms that are coming and going- allergy like 10. PREGNANCY: "Is there any chance you are pregnant?" "When was your last menstrual period?"       n/a 11. TRAVEL: "Have you traveled out of the country in the last month?" (e.g., travel history, exposures)       No travel, no known- works with public  Protocols used: BREATHING DIFFICULTY-A-AH

## 2018-04-11 NOTE — Telephone Encounter (Signed)
E visit set up with patient for today

## 2018-04-11 NOTE — Patient Instructions (Signed)
Telehealth

## 2018-04-11 NOTE — Telephone Encounter (Signed)
Received notification from [facility] stating pt is scheduled for [facility] on 04/17/18 @ 12:00 @ [facility]. [address] [place] [address] [phone]

## 2018-04-11 NOTE — Progress Notes (Signed)
Virtual Visit via Video   I connected withNAME@ on 04/11/18 at  3:00 PM EDT by a video enabled telemedicine application and verified that I am speaking with the correct person using two identifiers. Location patient: In her car Location provider: [place], Office Persons participating in the virtual visit: Patient, Dr. Raoul Pitch, R.Baker, LPN  I discussed the limitations of evaluation and management by telemedicine and the availability of in person appointments. The patient expressed understanding and agreed to proceed.  Subjective:   Chief Complaint  Patient presents with  . Cough    x1 week and has gotten worse, Dry cough with wheezing    . Sore Throat  . Nasal Congestion  . Fatigue   HPI:  Patient seen today for complaints of dry cough 1 week duration.  Reports  dry cough, sore throat, nasal congestion, chills and fatigue.  She endorses wheezing. Patient does have a significant history of asthma.  She was treated 4 weeks ago by another provider for sinus infection.  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  Reports she was also recently diagnosed with lupus.  He has been using her Advair inhaler 1 puff 2 times a day.  She has not used her albuterol inhaler.  SHe is using her Flonase and Xyzal. She denies fever, nausea, vomit or bowel changes.  Denies current shortness of breath.  Reports pulse ox 97%. She works at [employer] and has been wearing a mask but is concerned for exposure.  She has no known COVID-19 exposure.  ROS: See pertinent positives and negatives per HPI.  Patient Active Problem List   Diagnosis Date Noted  . DDD (degenerative disc disease), lumbar 04/10/2018  . 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 Primary osteoarthritis of both hands 04/10/2018  . Non-seasonal allergic rhinitis due to pollen 02/07/2018  . Other meniscus derangements, posterior horn of medial meniscus, left knee 11/24/2017  . Obesity (BMI 30-39.9) 02/20/2016  .  Cataract of both eyes 02/20/2016  . Pigmentary glaucoma of both eyes   . Hiatal hernia 11/06/2015  . Hyperlipidemia 11/06/2015  . Asthma 11/06/2015  . Uterine fibroids in pregnancy, postpartum condition 11/06/2015  . Hypothyroidism 10/17/2015    Social History   Tobacco Use  . Smoking status: Former Smoker    Packs/day: 3.00    Years: 12.00    Pack years: 36.00    Types: Cigarettes    Last attempt to quit: 07/12/1983    Years since quitting: 34.7  . Smokeless tobacco: Never Used  Substance Use Topics  . Alcohol use: Yes    Comment: social - 1 every 6 months    Current Outpatient Medications:  .  brimonidine (ALPHAGAN) 0.2 % ophthalmic solution, , Disp: , Rfl:  .  Cholecalciferol (VITAMIN D3) 10 MCG (400 UNIT) CAPS, Take by mouth., Disp: , Rfl:  .  cyanocobalamin 1000 MCG tablet, Take 1,000 mcg by mouth daily., Disp: , Rfl:  .  dorzolamide-timolol (COSOPT) 22.3-6.8 MG/ML ophthalmic solution, Place 1 drop into both eyes 2 (two) times daily. , Disp: , Rfl:  .  famotidine (PEPCID) 40 MG tablet, Take 1 tablet (40 mg total) by mouth daily., Disp: 14 tablet, Rfl: 0 .  fluticasone (FLONASE) 50 MCG/ACT nasal spray, Place 2 sprays into both nostrils daily., Disp: 16 g, Rfl: 6 .  Fluticasone-Salmeterol (ADVAIR) 100-50 MCG/DOSE AEPB, Inhale 1 puff into the lungs 2 (two) times daily., Disp: 1 each, Rfl: 5 .  HYDROcodone-acetaminophen (NORCO/VICODIN) 5-325 MG tablet, Take  1-2 tablets by mouth every 6 (six) hours as needed for moderate pain., Disp: 40 tablet, Rfl: 0 .  levocetirizine (XYZAL) 5 MG tablet, Take 1 tablet (5 mg total) by mouth every evening., Disp: 90 tablet, Rfl: 3 .  levothyroxine (SYNTHROID, LEVOTHROID) 25 MCG tablet, TAKE 1 TABLET BY MOUTH ONCE DAILY BEFORE BREAKFAST, Disp: 90 tablet, Rfl: 1 .  montelukast (SINGULAIR) 10 MG tablet, Take 1 tablet (10 mg total) by mouth at bedtime., Disp: 90 tablet, Rfl: 3 .  pravastatin (PRAVACHOL) 20 MG tablet, Take 1 tablet (20 mg total) by mouth  daily., Disp: 90 tablet, Rfl: 3  Allergies  Allergen Reactions  . Percocet [Oxycodone-Acetaminophen] Shortness Of Breath  . Amoxicillin Hives    Has patient had a PCN reaction causing immediate rash, facial/tongue/throat swelling, SOB or lightheadedness with hypotension: Yes Has patient had a PCN reaction causing severe rash involving mucus membranes or skin necrosis: No Has patient had a PCN reaction that required hospitalization No Has patient had a PCN reaction occurring within the last 10 years: Yes  If all of the above answers are "NO", then may proceed with Cephalosporin use.   Marland Kitchen Doxycycline Itching  . Oxycodone   . Demerol [Meperidine] Palpitations    Objective:  Temp 98.2 F (36.8 C) (Oral)   SpO2 97%  Gen: No acute distress. Nontoxic in appearance.  HENT: AT. [place].  MMM.  Eyes:Conjunctiva without redness, discharge or icterus. Chest: Cough not present during exam Skin: No rashes, purpura or petechiae.  Neuro: Alert. Oriented x3   Assessment and Plan:  Moderate persistent asthma with acute exacerbation/Non-seasonal allergic rhinitis due to pollen Discussed options with patient.  She is working in the [employer] at [employer] and is at increased risk of exposure.  Signs and symptoms appear to be consistent with her asthma and is likely a asthma exacerbation, although cannot rule out possible COVID-19 without testing, which is the current recommendations from [facility] health. - Rest, hydrate.  Continue Flonase, mucinex, Zyrtec and Singulair.  Continue Advair inhaler twice daily as prescribed.  Use albuterol inhaler for chest tightness or shortness of breath. Z-Pak prescribed, in 3 days if cough becomes productive or fever. Steroids contraindicated with her glaucoma history. If cough present it can last up to 6-8 weeks.  F/U 2 weeks of not improved.  If symptoms are worsening, chest neck nodes or shortness of breath patient is to call ahead to her local ED and be seen immediately  for possible COVID-19.  Until that time she is to be quarantined for 14 days.  Letter was provided to her today to give to her employer.  She was agreeable.   > 25 minutes spent with patient, >50% of time spent face to face counseling   Howard Pouch, DO 04/11/2018

## 2018-04-12 ENCOUNTER — Encounter: Payer: Self-pay | Admitting: Rheumatology

## 2018-04-12 ENCOUNTER — Telehealth (INDEPENDENT_AMBULATORY_CARE_PROVIDER_SITE_OTHER): Payer: BLUE CROSS/BLUE SHIELD | Admitting: Rheumatology

## 2018-04-12 DIAGNOSIS — M19041 Primary osteoarthritis, right hand: Secondary | ICD-10-CM

## 2018-04-12 DIAGNOSIS — Z8719 Personal history of other diseases of the digestive system: Secondary | ICD-10-CM

## 2018-04-12 DIAGNOSIS — Z8639 Personal history of other endocrine, nutritional and metabolic disease: Secondary | ICD-10-CM

## 2018-04-12 DIAGNOSIS — M5136 Other intervertebral disc degeneration, lumbar region: Secondary | ICD-10-CM

## 2018-04-12 DIAGNOSIS — E785 Hyperlipidemia, unspecified: Secondary | ICD-10-CM

## 2018-04-12 DIAGNOSIS — M19042 Primary osteoarthritis, left hand: Secondary | ICD-10-CM

## 2018-04-12 DIAGNOSIS — Z6838 Body mass index (BMI) 38.0-38.9, adult: Secondary | ICD-10-CM

## 2018-04-12 DIAGNOSIS — M51369 Other intervertebral disc degeneration, lumbar region without mention of lumbar back pain or lower extremity pain: Secondary | ICD-10-CM

## 2018-04-12 DIAGNOSIS — R5383 Other fatigue: Secondary | ICD-10-CM

## 2018-04-12 DIAGNOSIS — R768 Other specified abnormal immunological findings in serum: Secondary | ICD-10-CM | POA: Diagnosis not present

## 2018-04-12 DIAGNOSIS — M170 Bilateral primary osteoarthritis of knee: Secondary | ICD-10-CM

## 2018-04-12 DIAGNOSIS — Z8669 Personal history of other diseases of the nervous system and sense organs: Secondary | ICD-10-CM

## 2018-04-12 DIAGNOSIS — Z8709 Personal history of other diseases of the respiratory system: Secondary | ICD-10-CM

## 2018-04-12 NOTE — Progress Notes (Signed)
Virtual Visit via Telephone Note  I connected with Cathy Baxter on 04/12/18 at  3:00 PM EDT by telephone and verified that I am speaking with the correct person using two identifiers.   I discussed the limitations, risks, security and privacy concerns of performing an evaluation and management service by telephone and the availability of in person appointments. I also discussed with the patient that there may be a patient responsible charge related to this service. The patient expressed understanding and agreed to proceed.  EL:FYBOF back pain   History of Present Illness: Patient is a 58 year old female with a history of positive ANA, DDD, and osteoarthritis.  She continues to have chronic lower back pain.  She has been evaluated by Dr. Ernestina Patches in the past, who perform a cortisone injection.  She continues to have chronic knee joint pain and bilateral feet pain.  She states she is having numbness in the left foot. She denies any joint swelling.  She continues to have chronic fatigue.    Review of Systems  Constitutional: Positive for malaise/fatigue.       +Weight gain, diminished appetite  Eyes: Negative for photophobia, pain and redness.  Respiratory: Negative for cough, shortness of breath and wheezing.   Cardiovascular: Negative for chest pain and palpitations.  Gastrointestinal: Negative for blood in stool, constipation and diarrhea.  Genitourinary: Negative for dysuria.  Musculoskeletal: Positive for back pain and joint pain. Negative for myalgias and neck pain.  Skin: Negative for rash.       Erythema on palms  Neurological: Negative for dizziness and headaches.  Psychiatric/Behavioral: Negative for depression. The patient is not nervous/anxious and does not have insomnia.    Observations/Objective: Physical Exam  Constitutional: She is oriented to person, place, and time.  Neurological: She is alert and oriented to person, place, and time.  Psychiatric: Mood, memory, affect and  judgment normal.    Patient reports nocturnal pain.  Difficulty dressing/grooming: Denies Difficulty climbing stairs: Reports Difficulty getting out of chair: Reports Difficulty using hands for taps, buttons, cutlery, and/or writing: Denies  Findings:  March 22, 2018 EBV titer consistent with past infection, ENA negative, C3-C4 normal 02/03/18: ANA 1:40 NH, ENA negative, Hep panel negative, HIV-, TSH 1.02, Vitamin D 35, iron 108, TIBC 330, %sat 33, Ferritin 178  Assessment and Plan: Positive ANA: ANA titer 1: 40 which is not significant. ENA titer was negative. Lab findings from 02/03/18 and 03/22/18 were reviewed with the patient. All questions were addressed. Patient had no clinical features of autoimmune disease at this time.  Denies oral or nasal ulcerations, Raynaud's, malar rash, photosensitivity, hair loss, or sicca symptoms.  No joint swelling.  She was advised to notify us if she develops new or worsening symptoms.    Other fatigue: She has chronic fatigue.  Vitamin D was WNL and TSH WNL on 02/03/18. She is taking Vitamin B12 and vitamin D supplements.   DDD, Lumbar: Chronic pain.  She has been evaluated by Dr. Ernestina Patches in the past and he performed an injection in the past.  She was advised to follow up with Dr. Ernestina Patches due to worsening lower back pain.  She has started having radiating pain down the left LE.  She has noticed numbness on the dorsal aspect of the left foot.   Primary osteoarthritis of both hands: She has no hand pain or swelling.   Primary osteoarthritis of both knees: She has chronic pain in both knee joints.  She denies any joint swelling.  She has difficulty climbing steps and getting up from a chair.  She was advised to follow up with Dr. Ninfa Linden for further evaluation.   Follow Up Instructions: She will follow up PRN.     I discussed the assessment and treatment plan with the patient. The patient was provided an opportunity to ask questions and all were answered.  The patient agreed with the plan and demonstrated an understanding of the instructions.   The patient was advised to call back or seek an in-person evaluation if the symptoms worsen or if the condition fails to improve as anticipated.  I provided 22 minutes of non-face-to-face time during this encounter.  Bo Merino, MD  Scribed by- Ofilia Neas, PA-C

## 2018-04-27 ENCOUNTER — Ambulatory Visit (INDEPENDENT_AMBULATORY_CARE_PROVIDER_SITE_OTHER): Payer: BLUE CROSS/BLUE SHIELD | Admitting: Family Medicine

## 2018-04-27 ENCOUNTER — Encounter: Payer: Self-pay | Admitting: Family Medicine

## 2018-04-27 ENCOUNTER — Ambulatory Visit: Payer: Self-pay | Admitting: Family Medicine

## 2018-04-27 VITALS — Ht 62.5 in

## 2018-04-27 DIAGNOSIS — J301 Allergic rhinitis due to pollen: Secondary | ICD-10-CM

## 2018-04-27 DIAGNOSIS — J4541 Moderate persistent asthma with (acute) exacerbation: Secondary | ICD-10-CM

## 2018-04-27 NOTE — Progress Notes (Signed)
VIRTUAL VISIT VIA VIDEO  I connected with Cathy Baxter on 04/27/18 at 10:20 AM EDT by a video enabled telemedicine application and verified that I am speaking with the correct person using two identifiers. Location patient: Home Location provider: [facility], Office Persons participating in the virtual visit: Patient, Dr. Raoul Pitch and R.Baker, LPN  I discussed the limitations of evaluation and management by telemedicine and the availability of in person appointments. The patient expressed understanding and agreed to proceed.   SUBJECTIVE Chief Complaint  Patient presents with  . Wheezing    Pt was feeling better and then woke up this AM coughing, wheezing, and some sinus drainage Pt is having SOB. Pt denies fever but does not have thermometer. Unable to check vitals   . Cough    HPI:  Cathy Baxter is a 58 y.o. presents today to discuss follow-up on her recent respiratory illness.  He has remained in quarantine over the last 14 days.  Her initial symptoms started 04/04/2018.  He had complete resolution of symptoms after last virtual visit on 04/11/2018, however she woke up with symptoms this morning of cough that is mildly productive, and wheezing.  She took her Advair inhaler and it seemed to ease her shortness of breath.  She did not start the azithromycin yet.  She denies any fevers but endorses some chills.  She denies any current shortness of breath.  She denies any fevers.  She has not yet used her albuterol inhaler.  Prior note:  Patient seen today for complaints of dry cough 1 week duration.  Reports  dry cough, sore throat, nasal congestion, chills and fatigue.  She endorses wheezing. Patient does have a significant history of asthma.  She was treated 4 weeks ago by another provider for sinus infection.  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  Reports she was also recently diagnosed with lupus.  He has been using her Advair inhaler 1 puff 2 times a day.  She has  not used her albuterol inhaler.  SHe is using her Flonase and Xyzal. She denies fever, nausea, vomit or bowel changes.  Denies current shortness of breath.  Reports pulse ox 97%. She works at [employer] and has been wearing a mask but is concerned for exposure.  She has no known COVID-19 exposure. ROS: See pertinent positives and negatives per HPI.  Patient Active Problem List   Diagnosis Date Noted  . DDD (degenerative disc disease), lumbar 04/10/2018  . 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 Primary osteoarthritis of both hands 04/10/2018  . Non-seasonal allergic rhinitis due to pollen 02/07/2018  . Other meniscus derangements, posterior horn of medial meniscus, left knee 11/24/2017  . Obesity (BMI 30-39.9) 02/20/2016  . Cataract of both eyes 02/20/2016  . Pigmentary glaucoma of both eyes   . Hiatal hernia 11/06/2015  . Hyperlipidemia 11/06/2015  . Asthma 11/06/2015  . Uterine fibroids in pregnancy, postpartum condition 11/06/2015  . Hypothyroidism 10/17/2015    Social History   Tobacco Use  . Smoking status: Former Smoker    Packs/day: 3.00    Years: 12.00    Pack years: 36.00    Types: Cigarettes    Last attempt to quit: 07/12/1983    Years since quitting: 34.8  . Smokeless tobacco: Never Used  Substance Use Topics  . Alcohol use: Yes    Comment: social - 1 every 6 months    Current Outpatient Medications:  .  brimonidine (ALPHAGAN) 0.2 % ophthalmic solution, ,  Disp: , Rfl:  .  Cholecalciferol (VITAMIN D3) 10 MCG (400 UNIT) CAPS, Take by mouth., Disp: , Rfl:  .  cyanocobalamin 1000 MCG tablet, Take 1,000 mcg by mouth daily., Disp: , Rfl:  .  dorzolamide-timolol (COSOPT) 22.3-6.8 MG/ML ophthalmic solution, Place 1 drop into both eyes 2 (two) times daily. , Disp: , Rfl:  .  famotidine (PEPCID) 40 MG tablet, Take 1 tablet (40 mg total) by mouth daily., Disp: 14 tablet, Rfl: 0 .  fluticasone (FLONASE) 50 MCG/ACT nasal spray, Place 2 sprays into both nostrils daily.,  Disp: 16 g, Rfl: 6 .  Fluticasone-Salmeterol (ADVAIR) 100-50 MCG/DOSE AEPB, Inhale 1 puff into the lungs 2 (two) times daily., Disp: 1 each, Rfl: 5 .  HYDROcodone-acetaminophen (NORCO/VICODIN) 5-325 MG tablet, Take 1-2 tablets by mouth every 6 (six) hours as needed for moderate pain., Disp: 40 tablet, Rfl: 0 .  levocetirizine (XYZAL) 5 MG tablet, Take 1 tablet (5 mg total) by mouth every evening., Disp: 90 tablet, Rfl: 3 .  levothyroxine (SYNTHROID, LEVOTHROID) 25 MCG tablet, TAKE 1 TABLET BY MOUTH ONCE DAILY BEFORE BREAKFAST, Disp: 90 tablet, Rfl: 1 .  montelukast (SINGULAIR) 10 MG tablet, Take 1 tablet (10 mg total) by mouth at bedtime., Disp: 90 tablet, Rfl: 3 .  pravastatin (PRAVACHOL) 20 MG tablet, Take 1 tablet (20 mg total) by mouth daily., Disp: 90 tablet, Rfl: 3 .  azithromycin (ZITHROMAX) 250 MG tablet, 500  Mg day 1 , then 250 mg QD (Patient not taking: Reported on 04/27/2018), Disp: 6 tablet, Rfl: 0  Allergies  Allergen Reactions  . Percocet [Oxycodone-Acetaminophen] Shortness Of Breath  . Amoxicillin Hives    Has patient had a PCN reaction causing immediate rash, facial/tongue/throat swelling, SOB or lightheadedness with hypotension: Yes Has patient had a PCN reaction causing severe rash involving mucus membranes or skin necrosis: No Has patient had a PCN reaction that required hospitalization No Has patient had a PCN reaction occurring within the last 10 years: Yes  If all of the above answers are "NO", then may proceed with Cephalosporin use.   Marland Kitchen Doxycycline Itching  . Oxycodone   . Demerol [Meperidine] Palpitations    OBJECTIVE: Ht 5' 2.5" (1.588 m)   BMI 38.52 kg/m  Gen: No acute distress. Nontoxic in appearance.  HENT: AT. [place].  MMM.  Eyes:Pupils Equal Round Reactive to light, Extraocular movements intact,  Conjunctiva without redness, discharge or icterus. CV: no edema Chest: Cough or shortness of breath not present. Speaking in full sentences.  Skin: no rashes,  purpura or petechiae.  Neuro:  Normal gait. Alert. Oriented x3   ASSESSMENT AND PLAN: Cathy Baxter is a 58 y.o. female present for  Signs and symptoms appear to be consistent with her asthma and is likely a asthma exacerbation, although cannot rule out possible COVID-19 without testing, which is the current recommendations from [facility] health. - Continue to Rest, hydrate.  - continue  Flonase, mucinex, Zyrtec, Singulair and  Advair inhaler twice daily as prescribed.  Use albuterol inhaler for chest tightness or shortness of breath<--- again encouraged her to use with wheezing.  Z-Pak prescribed,--> start  Steroids contraindicated with her glaucoma history. Return to work 05/01/2018.   mailed her excuse, also posted to her MyChart if she would like to print out. F/U PRN  > 15 minutes spent with patient, >50% of time spent face to face counseling    Howard Pouch, DO 04/27/2018

## 2018-04-27 NOTE — Telephone Encounter (Signed)
Pt. Reports she had a visit with Dr. Raoul Pitch the end of March with her asthma. States she is coughing with clear mucus. Denies any fever. Has some wheezing on and off.Used her Advair inhaler this morning and reports that helped. Concerned about corona virus. Warm transferred to [facility] in the practice for an appointment. Answer Assessment - Initial Assessment Questions 1. ONSET: "When did the cough begin?"      This morning 2. SEVERITY: "How bad is the cough today?"      Mild 3. RESPIRATORY DISTRESS: "Describe your breathing."      Chest is a little tight 4. FEVER: "Do you have a fever?" If so, ask: "What is your temperature, how was it measured, and when did it start?"     No 5. SPUTUM: "Describe the color of your sputum" (clear, white, yellow, green)     Clear 6. HEMOPTYSIS: "Are you coughing up any blood?" If so ask: "How much?" (flecks, streaks, tablespoons, etc.)     No 7. CARDIAC HISTORY: "Do you have any history of heart disease?" (e.g., heart attack, congestive heart failure)      No 8. LUNG HISTORY: "Do you have any history of lung disease?"  (e.g., pulmonary embolus, asthma, emphysema)     Asthma 9. PE RISK FACTORS: "Do you have a history of blood clots?" (or: recent major surgery, recent prolonged travel, bedridden)     No 10. OTHER SYMPTOMS: "Do you have any other symptoms?" (e.g., runny nose, wheezing, chest pain)       Wheezing on and off 11. PREGNANCY: "Is there any chance you are pregnant?" "When was your last menstrual period?"       No 12. TRAVEL: "Have you traveled out of the country in the last month?" (e.g., travel history, exposures)       No  Protocols used: [place]

## 2018-04-27 NOTE — Patient Instructions (Addendum)
Signs and symptoms appear to be consistent with asthma and is likely a asthma exacerbation, although cannot rule out possible COVID-19 without testing, which is the current recommendations from [facility] health. - Continue to Rest, hydrate over the weekend - continue  Flonase, mucinex, Zyrtec, Singulair and  Advair inhaler twice daily as prescribed.  Use albuterol inhaler for chest tightness or shortness of breath Start Z-Pak  Return to work 05/01/2018.   If symptoms worsen present to the tent outside of [place] ED to be evaluated.

## 2018-05-22 ENCOUNTER — Telehealth: Payer: Self-pay | Admitting: Family Medicine

## 2018-05-22 NOTE — Telephone Encounter (Signed)
Patient transferred from [facility]. Patient having chest pain/heavyness x 2 days. Advised patient to go to ER, patient agreed. Will schedule follow up visit if needed.

## 2018-05-25 ENCOUNTER — Encounter (HOSPITAL_COMMUNITY): Payer: Self-pay | Admitting: Emergency Medicine

## 2018-05-25 ENCOUNTER — Emergency Department (HOSPITAL_COMMUNITY)
Admission: EM | Admit: 2018-05-25 | Discharge: 2018-05-25 | Disposition: A | Discharge status: Home / Self Care | Payer: BLUE CROSS/BLUE SHIELD | Attending: Emergency Medicine | Admitting: Emergency Medicine

## 2018-05-25 ENCOUNTER — Other Ambulatory Visit: Payer: Self-pay

## 2018-05-25 ENCOUNTER — Emergency Department (HOSPITAL_COMMUNITY): Payer: BLUE CROSS/BLUE SHIELD

## 2018-05-25 DIAGNOSIS — E039 Hypothyroidism, unspecified: Secondary | ICD-10-CM | POA: Diagnosis not present

## 2018-05-25 DIAGNOSIS — Z8709 Personal history of other diseases of the respiratory system: Secondary | ICD-10-CM | POA: Diagnosis not present

## 2018-05-25 DIAGNOSIS — R197 Diarrhea, unspecified: Secondary | ICD-10-CM

## 2018-05-25 DIAGNOSIS — Z79899 Other long term (current) drug therapy: Secondary | ICD-10-CM | POA: Insufficient documentation

## 2018-05-25 DIAGNOSIS — Z20828 Contact with and (suspected) exposure to other viral communicable diseases: Secondary | ICD-10-CM | POA: Insufficient documentation

## 2018-05-25 DIAGNOSIS — R0789 Other chest pain: Secondary | ICD-10-CM | POA: Diagnosis not present

## 2018-05-25 DIAGNOSIS — R1010 Upper abdominal pain, unspecified: Secondary | ICD-10-CM | POA: Diagnosis present

## 2018-05-25 DIAGNOSIS — Z87891 Personal history of nicotine dependence: Secondary | ICD-10-CM | POA: Insufficient documentation

## 2018-05-25 LAB — BASIC METABOLIC PANEL
Anion gap: 9 (ref 5–15)
BUN: 13 mg/dL (ref 6–20)
CO2: 24 mmol/L (ref 22–32)
Calcium: 8.9 mg/dL (ref 8.9–10.3)
Chloride: 106 mmol/L (ref 98–111)
Creatinine, Ser: 0.89 mg/dL (ref 0.44–1.00)
GFR calc Af Amer: >60 mL/min (ref >60)
GFR calc non Af Amer: >60 mL/min (ref >60)
Glucose, Bld: 94 mg/dL (ref 70–99)
Potassium: 4.4 mmol/L (ref 3.5–5.1)
Sodium: 139 mmol/L (ref 135–145)

## 2018-05-25 LAB — HEPATIC FUNCTION PANEL
ALT: 41 U/L (ref 0–44)
AST: 33 U/L (ref 15–41)
Albumin: 3.5 g/dL (ref 3.5–5.0)
Alkaline Phosphatase: 65 U/L (ref 38–126)
Bilirubin, Direct: <0.1 mg/dL (ref 0.0–0.2)
Total Bilirubin: 0.4 mg/dL (ref 0.3–1.2)
Total Protein: 6.9 g/dL (ref 6.5–8.1)

## 2018-05-25 LAB — URINALYSIS, ROUTINE W REFLEX MICROSCOPIC
Bilirubin Urine: NEGATIVE
Glucose, UA: NEGATIVE mg/dL
Hgb urine dipstick: NEGATIVE
Ketones, ur: NEGATIVE mg/dL
Leukocytes,Ua: NEGATIVE
Nitrite: NEGATIVE
Protein, ur: NEGATIVE mg/dL
Specific Gravity, Urine: 1.017 (ref 1.005–1.030)
pH: 7 (ref 5.0–8.0)

## 2018-05-25 LAB — CBC
HCT: 40.1 % (ref 36.0–46.0)
Hemoglobin: 13.4 g/dL (ref 12.0–15.0)
MCH: 33.4 pg (ref 26.0–34.0)
MCHC: 33.4 g/dL (ref 30.0–36.0)
MCV: 100 fL (ref 80.0–100.0)
Platelets: 382 10*3/uL (ref 150–400)
RBC: 4.01 MIL/uL (ref 3.87–5.11)
RDW: 11.9 % (ref 11.5–15.5)
WBC: 7.3 10*3/uL (ref 4.0–10.5)
nRBC: 0 % (ref 0.0–0.2)

## 2018-05-25 LAB — TROPONIN I
Troponin I: <0.03 ng/mL (ref <0.03)
Troponin I: <0.03 ng/mL (ref <0.03)

## 2018-05-25 LAB — LIPASE, BLOOD: Lipase: 30 U/L (ref 11–51)

## 2018-05-25 MED ORDER — IOHEXOL 300 MG/ML  SOLN
100.0000 mL | Freq: Once | INTRAMUSCULAR | Status: AC | PRN
  Administered 2018-05-25: 18:00:00 100 mL via INTRAVENOUS

## 2018-05-25 MED ORDER — LACTATED RINGERS IV BOLUS
1000.0000 mL | Freq: Once | INTRAVENOUS | Status: AC
  Administered 2018-05-25: 1000 mL via INTRAVENOUS

## 2018-05-25 MED ORDER — SODIUM CHLORIDE 0.9% FLUSH: 3.0000 mL | Freq: Once | INTRAVENOUS | Status: DC

## 2018-05-25 NOTE — ED Provider Notes (Signed)
[facility] Provider Note   CSN: [id number] Arrival date & time: 05/25/18  1254    History   Chief Complaint Chief Complaint  Patient presents with   Abdominal Pain   Diarrhea   Chest Pain    HPI Lovely Kerins is a 58 y.o. female.  HPI 58 year old female with a history of hypothyroidism, GERD, asthma presents with abdominal pain, diarrhea, and chest pain.  Patient states that she said 3 to 4 days of cramping periumbilical abdominal pain.  Pain is intermittent and currently rated 7/10.  She said associated nonbloody diarrhea over this time period.  She states she has had 1-2 episodes of diarrhea this morning.  No nausea or vomiting.  No recent travel, new foods, new medications.  She has been eating and drinking without issues.  She is previously had her gallbladder removed.  Denies urinary symptoms or pelvic pain.  No vaginal bleeding or discharge.  She also states that yesterday she had an episode of chest pain described as pressure.  Pain occurred at rest and only lasted a few minutes.  Pain was nonradiating and not associated with nausea, vomiting, or diaphoresis. She states that her mother was concerned that she had COVID and wanted her to be evaluated today.  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/10/2018   Primary osteoarthritis of both hands 04/10/2018   Non-seasonal allergic rhinitis due to pollen 02/07/2018   Other meniscus derangements, posterior horn of medial meniscus, left knee 11/24/2017   Obesity (BMI  30-39.9) 02/20/2016   Cataract of both eyes 02/20/2016   Pigmentary glaucoma of both eyes    Hiatal hernia 11/06/2015   Hyperlipidemia 11/06/2015   Asthma 11/06/2015   Uterine fibroids in pregnancy, postpartum condition 11/06/2015   Hypothyroidism 10/17/2015    Past Surgical History:  Procedure Laterality Date   CHOLECYSTECTOMY  2002   EYE SURGERY     KNEE ARTHROSCOPY Left 08/2015   NASAL SINUS SURGERY  2000     OB History    Gravida  3   Para  3   Term  3   Preterm      AB      Living  2     SAB      TAB      Ectopic      Multiple      Live Births               Home Medications    Prior to Admission medications   Medication Sig Start Date End Date Taking? Authorizing Provider  azithromycin (ZITHROMAX) 250 MG tablet 500  Mg day 1 , then 250 mg QD Patient not taking: Reported on 04/27/2018 04/11/18   Howard Pouch A, DO  brimonidine (ALPHAGAN) 0.2 % ophthalmic solution  02/25/18   [provider]  Cholecalciferol (VITAMIN D3) 10 MCG (400 UNIT) CAPS Take by mouth.    [provider]  cyanocobalamin 1000 MCG  tablet Take 1,000 mcg by mouth daily.    [provider]  dorzolamide-timolol (COSOPT) 22.3-6.8 MG/ML ophthalmic solution Place 1 drop into both eyes 2 (two) times daily.  09/18/15   [provider]  famotidine (PEPCID) 40 MG tablet Take 1 tablet (40 mg total) by mouth daily. 03/16/18   Little, Wenda Overland, MD  fluticasone (FLONASE) 50 MCG/ACT nasal spray Place 2 sprays into both nostrils daily. 05/09/15   Brayton Caves, PA-C  Fluticasone-Salmeterol (ADVAIR) 100-50 MCG/DOSE AEPB Inhale 1 puff into the lungs 2 (two) times daily. 02/03/18   Kuneff, Renee A, DO  HYDROcodone-acetaminophen (NORCO/VICODIN) 5-325 MG tablet Take 1-2 tablets by mouth every 6 (six) hours as needed for moderate pain. 01/26/18   Mcarthur Rossetti, MD  levocetirizine (XYZAL) 5 MG tablet Take 1 tablet (5 mg total) by mouth every evening.  02/03/18   Kuneff, Renee A, DO  levothyroxine (SYNTHROID, LEVOTHROID) 25 MCG tablet TAKE 1 TABLET BY MOUTH ONCE DAILY BEFORE BREAKFAST 03/20/18   Kuneff, Renee A, DO  montelukast (SINGULAIR) 10 MG tablet Take 1 tablet (10 mg total) by mouth at bedtime. 02/03/18   Kuneff, Renee A, DO  pravastatin (PRAVACHOL) 20 MG tablet Take 1 tablet (20 mg total) by mouth daily. 12/29/17   Ma Hillock, DO    Family History Family History  Problem Relation Age of Onset   Heart disease Mother    Early death Father    Cancer Son 90       Neuroepithelioma    Breast cancer Sister 73    Social History Social History   Tobacco Use   Smoking status: Former Smoker    Packs/day: 3.00    Years: 12.00    Pack years: 36.00    Types: Cigarettes    Last attempt to quit: 07/12/1983    Years since quitting: 34.8   Smokeless tobacco: Never Used  Substance Use Topics   Alcohol use: Yes    Comment: social - 1 every 6 months   Drug use: No     Allergies   Percocet [oxycodone-acetaminophen]; Amoxicillin; Doxycycline; Oxycodone; and Demerol [meperidine]   Review of Systems Review of Systems  Constitutional: Negative for chills and fever.  HENT: Negative for ear pain and sore throat.   Eyes: Negative for pain and visual disturbance.  Respiratory: Negative for cough and shortness of breath.   Cardiovascular: Positive for chest pain. Negative for palpitations.  Gastrointestinal: Positive for abdominal pain and diarrhea. Negative for vomiting.  Genitourinary: Negative for dysuria and hematuria.  Musculoskeletal: Negative for arthralgias and back pain.  Skin: Negative for color change and rash.  Neurological: Negative for seizures and syncope.  All other systems reviewed and are negative.    Physical Exam Updated Vital Signs BP 116/77 (BP Location: Left Arm)    Pulse 80    Temp 98.5 F (36.9 C) (Oral)    Resp 18    SpO2 99%   Physical Exam Vitals signs and nursing note reviewed.    Constitutional:      General: She is not in acute distress.    Appearance: She is well-developed.  HENT:     Head: Normocephalic and atraumatic.     Mouth/Throat:     Mouth: Mucous membranes are moist.  Eyes:     Conjunctiva/sclera: Conjunctivae normal.  Neck:     Musculoskeletal: Normal range of motion.  Cardiovascular:     Rate and Rhythm: Normal rate and regular rhythm.     Heart sounds:  No murmur.  Pulmonary:     Effort: Pulmonary effort is normal. No respiratory distress.     Breath sounds: Normal breath sounds.  Abdominal:     Palpations: Abdomen is soft.     Tenderness: There is no abdominal tenderness.  Musculoskeletal: Normal range of motion.        General: No tenderness.  Skin:    General: Skin is warm and dry.  Neurological:     General: No focal deficit present.     Mental Status: She is alert and oriented to person, place, and time.      ED Treatments / Results  Labs (all labs ordered are listed, but only abnormal results are displayed) Labs Reviewed  NOVEL CORONAVIRUS, NAA (HOSPITAL ORDER, SEND-OUT TO REF LAB)  BASIC METABOLIC PANEL  CBC  TROPONIN I  LIPASE, BLOOD  URINALYSIS, ROUTINE W REFLEX MICROSCOPIC  TROPONIN I  HEPATIC FUNCTION PANEL    EKG EKG Interpretation  Date/Time:  Thursday May 25 2018 13:09:40 EDT Ventricular Rate:  71 PR Interval:  170 QRS Duration: 82 QT Interval:  406 QTC Calculation: 441 R Axis:   28 Text Interpretation:  Normal sinus rhythm Normal ECG No significant change since last tracing Confirmed by Wandra Arthurs [phone]) on 05/25/2018 3:09:26 PM   Radiology Dg Chest 2 View  Result Date: 05/25/2018 CLINICAL DATA:  Lower abdominal pain and diarrhea over the last 3 days. Nasal congestion as well. Central chest pain yesterday. EXAM: CHEST - 2 VIEW COMPARISON:  06/01/2017 FINDINGS: Heart size is normal. Mediastinal shadows are normal. There is mild atelectasis in the medial right lower lung visible on the frontal view. No  lobar collapse. No sign of consolidation or effusion. No abnormal bone finding. IMPRESSION: Areas of linear atelectasis in the medial right lower lung, not present previously. Electronically Signed   By: Nelson Chimes M.D.   On: 05/25/2018 13:35   Ct Abdomen Pelvis W Contrast  Result Date: 05/25/2018 CLINICAL DATA:  Abdominal distension. EXAM: CT ABDOMEN AND PELVIS WITH CONTRAST TECHNIQUE: Multidetector CT imaging of the abdomen and pelvis was performed using the standard protocol following bolus administration of intravenous contrast. CONTRAST:  125mL OMNIPAQUE IOHEXOL 300 MG/ML  SOLN COMPARISON:  CT scan of March 16, 2018. FINDINGS: Lower chest: No acute abnormality. Hepatobiliary: No focal liver abnormality is seen. Status post cholecystectomy. No biliary dilatation. Pancreas: Unremarkable. No pancreatic ductal dilatation or surrounding inflammatory changes. Spleen: Normal in size without focal abnormality. Adrenals/Urinary Tract: Adrenal glands are unremarkable. Kidneys are normal, without renal calculi, focal lesion, or hydronephrosis. Bladder is unremarkable. Stomach/Bowel: The stomach appears normal. There is no evidence of bowel obstruction or inflammation. The appendix is not visualized. Vascular/Lymphatic: No significant vascular findings are present. No enlarged abdominal or pelvic lymph nodes. Reproductive: Uterus and bilateral adnexa are unremarkable. Other: No abdominal wall hernia or abnormality. No abdominopelvic ascites. Musculoskeletal: No acute or significant osseous findings. IMPRESSION: No significant abnormality seen in the abdomen or pelvis. Electronically Signed   By: Marijo Conception M.D.   On: 05/25/2018 18:15    Procedures Procedures (including critical care time)  Medications Ordered in ED Medications  sodium chloride flush (NS) 0.9 % injection 3 mL (has no administration in time range)  lactated ringers bolus 1,000 mL (0 mLs Intravenous Stopped 05/25/18 1839)  iohexol  (OMNIPAQUE) 300 MG/ML solution 100 mL (100 mLs Intravenous Contrast Given 05/25/18 1736)     Initial Impression / Assessment and Plan / ED Course  I have reviewed  the triage vital signs and the nursing notes.  Pertinent labs & imaging results that were available during my care of the patient were reviewed by me and considered in my medical decision making (see chart for details).  58 year old female with a history of hypothyroidism, GERD, asthma presents with abdominal pain, diarrhea, and chest pain.  Patient was soft blood pressures but she is not tachycardic or febrile.  She is otherwise well-appearing.  Normal work of breathing.  Normal SPO2 on room air.  She denies chest pain or shortness of breath at this time.  EKG normal sinus rhythm with no ischemic changes.  Normal intervals and axis.  Chest x-ray shows areas of linear atelectasis in the medial right lower lung, not present previously.  Patient has no cough, fever, or leukocytosis.  I have low clinical suspicion for pneumonia.  Patient's abdomen is soft and nontender.  No guarding.  Does rate the pain a 7/10 at this time.  Will obtain CT of the abdomen pelvis to further evaluate.   Labs are unremarkable. HEAR 3.  Troponins undetectable as 2.  Patient given a bolus of fluids.  CT scan unremarkable.  Discussed the labs and imaging results with the patient.  I discussed the need for isolation until her COVID test returned in the next 24 to 48 hours.  Patient voiced understanding.  Discharged home in stable condition with strict return precautions.  Deeana Atwater was evaluated in Emergency Department on @HNDADMITDT @ for the symptoms described in the history of present illness. He/she was evaluated in the context of the global COVID-19 pandemic, which necessitated consideration that the patient might be at risk for infection with the SARS-CoV-2 virus that causes COVID-19. Institutional protocols and algorithms that pertain to the  evaluation of patients at risk for COVID-19 are in a state of rapid change based on information released by regulatory bodies including the CDC and federal and state organizations. These policies and algorithms were followed during the patient's care in the ED.  Final Clinical Impressions(s) / ED Diagnoses   Final diagnoses:  Diarrhea, unspecified type  Atypical chest pain    ED Discharge Orders    None       Trinidad Curet, MD 05/25/18 5638    Blanchie Dessert, MD 05/30/18 1936

## 2018-05-25 NOTE — Discharge Instructions (Addendum)
Person Under Monitoring Name: [facility]  Location: [address] [place] [address]   Infection Prevention Recommendations for Individuals Confirmed to have, or Being Evaluated for, 2019 Novel Coronavirus (COVID-19) Infection Who Receive Care at Home  Individuals who are confirmed to have, or are being evaluated for, COVID-19 should follow the prevention steps below until a healthcare provider or local or state health department says they can return to normal activities.  Stay home except to get medical care You should restrict activities outside your home, except for getting medical care. Do not go to work, school, or public areas, and do not use public transportation or taxis.  Call ahead before visiting your doctor Before your medical appointment, call the healthcare provider and tell them that you have, or are being evaluated for, COVID-19 infection. This will help the healthcare providers office take steps to keep other people from getting infected. Ask your healthcare provider to call the local or state health department.  Monitor your symptoms Seek prompt medical attention if your illness is worsening (e.g., difficulty breathing). Before going to your medical appointment, call the healthcare provider and tell them that you have, or are being evaluated for, COVID-19 infection. Ask your healthcare provider to call the local or state health department.  Wear a facemask You should wear a facemask that covers your nose and mouth when you are in the same room with other people and when you visit a healthcare provider. People who live with or visit you should also wear a facemask while they are in the same room with you.  Separate yourself from other people in your home As much as possible, you should stay in a different room from other people in your home. Also, you should use a separate bathroom, if available.  Avoid sharing household items You should not share  dishes, drinking glasses, cups, eating utensils, towels, bedding, or other items with other people in your home. After using these items, you should wash them thoroughly with soap and water.  Cover your coughs and sneezes Cover your mouth and nose with a tissue when you cough or sneeze, or you can cough or sneeze into your sleeve. Throw used tissues in a lined trash can, and immediately wash your hands with soap and water for at least 20 seconds or use an alcohol-based hand rub.  Wash your [facility] your hands often and thoroughly with soap and water for at least 20 seconds. You can use an alcohol-based hand sanitizer if soap and water are not available and if your hands are not visibly dirty. Avoid touching your eyes, nose, and mouth with unwashed hands.   Prevention Steps for Caregivers and Household Members of Individuals Confirmed to have, or Being Evaluated for, COVID-19 Infection Being Cared for in the Home  If you live with, or provide care at home for, a person confirmed to have, or being evaluated for, COVID-19 infection please follow these guidelines to prevent infection:  Follow healthcare providers instructions Make sure that you understand and can help the patient follow any healthcare provider instructions for all care.  Provide for the patients basic needs You should help the patient with basic needs in the home and provide support for getting groceries, prescriptions, and other personal needs.  Monitor the patients symptoms If they are getting sicker, call his or her medical provider and tell them that the patient has, or is being evaluated for, COVID-19 infection. This will help the healthcare providers office take  steps to keep other people from getting infected. Ask the healthcare provider to call the local or state health department.  Limit the number of people who have contact with the patient If possible, have only one caregiver for the patient. Other  household members should stay in another home or place of residence. If this is not possible, they should stay in another room, or be separated from the patient as much as possible. Use a separate bathroom, if available. Restrict visitors who do not have an essential need to be in the home.  Keep older adults, very young children, and other sick people away from the patient Keep older adults, very young children, and those who have compromised immune systems or chronic health conditions away from the patient. This includes people with chronic heart, lung, or kidney conditions, diabetes, and cancer.  Ensure good ventilation Make sure that shared spaces in the home have good air flow, such as from an air conditioner or an opened window, weather permitting.  Wash your hands often Wash your hands often and thoroughly with soap and water for at least 20 seconds. You can use an alcohol based hand sanitizer if soap and water are not available and if your hands are not visibly dirty. Avoid touching your eyes, nose, and mouth with unwashed hands. Use disposable paper towels to dry your hands. If not available, use dedicated cloth towels and replace them when they become wet.  Wear a facemask and gloves Wear a disposable facemask at all times in the room and gloves when you touch or have contact with the patients blood, body fluids, and/or secretions or excretions, such as sweat, saliva, sputum, nasal mucus, vomit, urine, or feces.  Ensure the mask fits over your nose and mouth tightly, and do not touch it during use. Throw out disposable facemasks and gloves after using them. Do not reuse. Wash your hands immediately after removing your facemask and gloves. If your personal clothing becomes contaminated, carefully remove clothing and launder. Wash your hands after handling contaminated clothing. Place all used disposable facemasks, gloves, and other waste in a lined container before disposing them with  other household waste. Remove gloves and wash your hands immediately after handling these items.  Do not share dishes, glasses, or other household items with the patient Avoid sharing household items. You should not share dishes, drinking glasses, cups, eating utensils, towels, bedding, or other items with a patient who is confirmed to have, or being evaluated for, COVID-19 infection. After the person uses these items, you should wash them thoroughly with soap and water.  Wash laundry thoroughly Immediately remove and wash clothes or bedding that have blood, body fluids, and/or secretions or excretions, such as sweat, saliva, sputum, nasal mucus, vomit, urine, or feces, on them. Wear gloves when handling laundry from the patient. Read and follow directions on labels of laundry or clothing items and detergent. In general, wash and dry with the warmest temperatures recommended on the label.  Clean all areas the individual has used often Clean all touchable surfaces, such as counters, tabletops, doorknobs, bathroom fixtures, toilets, phones, keyboards, tablets, and bedside tables, every day. Also, clean any surfaces that may have blood, body fluids, and/or secretions or excretions on them. Wear gloves when cleaning surfaces the patient has come in contact with. Use a diluted bleach solution (e.g., dilute bleach with 1 part bleach and 10 parts water) or a household disinfectant with a label that says EPA-registered for coronaviruses. To make a bleach solution  at home, add 1 tablespoon of bleach to 1 quart (4 cups) of water. For a larger supply, add  cup of bleach to 1 gallon (16 cups) of water. Read labels of cleaning products and follow recommendations provided on product labels. Labels contain instructions for safe and effective use of the cleaning product including precautions you should take when applying the product, such as wearing gloves or eye protection and making sure you have good ventilation  during use of the product. Remove gloves and wash hands immediately after cleaning.  Monitor yourself for signs and symptoms of illness Caregivers and household members are considered close contacts, should monitor their health, and will be asked to limit movement outside of the home to the extent possible. Follow the monitoring steps for close contacts listed on the symptom monitoring form.   ? If you have additional questions, contact your local health department or call the epidemiologist on call at [phone] (available 24/7). ? This guidance is subject to change. For the most up-to-date guidance from [facility], please refer to their website: YouBlogs.pl

## 2018-05-25 NOTE — ED Triage Notes (Addendum)
Pt reports RLQ LLQ abdominal pain and diarrhea with nasal congestion  x 3 days. Pt denies sick contacts. Also reports central CP yesterday that has resolved.

## 2018-05-26 LAB — NOVEL CORONAVIRUS, NAA (HOSP ORDER, SEND-OUT TO REF LAB; TAT 18-24 HRS): SARS-CoV-2, NAA: NOT DETECTED

## 2018-05-30 ENCOUNTER — Telehealth: Payer: Self-pay | Admitting: Orthopedic Surgery

## 2018-05-30 NOTE — Telephone Encounter (Signed)
Please disregard

## 2018-05-31 ENCOUNTER — Other Ambulatory Visit: Payer: Self-pay

## 2018-05-31 ENCOUNTER — Ambulatory Visit (INDEPENDENT_AMBULATORY_CARE_PROVIDER_SITE_OTHER): Payer: BLUE CROSS/BLUE SHIELD | Admitting: Orthopaedic Surgery

## 2018-05-31 ENCOUNTER — Encounter: Payer: Self-pay | Admitting: Orthopaedic Surgery

## 2018-05-31 DIAGNOSIS — Z9889 Other specified postprocedural states: Secondary | ICD-10-CM

## 2018-05-31 NOTE — Progress Notes (Signed)
The patient is very well-known to me.  She has been dealing with injury sustained in a work-related slip and fall that occurred I believe in early 2019.  She had had some issues with the right side of her back and sciatic region but that is since resolved.  Of greater concern has been her left knee.  We actually ended up performing an arthroscopic intervention of that knee.  She already had pre-existing medial compartment arthritic findings and a previous medial meniscal tear.  At her repeat arthroscopy we found slight worsening of her arthritis but no new meniscal tear.  We have had her on mainly sedentary work.  She is worked on activity modification and weight loss.  She is morbidly obese.  She has been through physical therapy as well as hyaluronic acid injections on her left knee.  She has been to physical therapy as well.  This combination of things has helped lessen her left knee pain.  She understands that there will be a component of chronic pain as a relates to pre-existing arthritis.  She is recently been for a functional capacity evaluation.  The evaluator stated that the patient gave a consistent and reliable effort throughout the FCE and that the results of that are valid.  The final recommendation is that she is someone who cannot go back to her regular physical demand activities as relates to the physical demands of her previous job.  It is recommended that she perform at a sedentary physical demand level with only occasional lifting no greater than 10 pounds.  She should avoid any work that involves walking long distance or standing for long period time.  She should also avoid crawling, squatting, pushing or pulling activities.  On examination her left knee is stable in terms of no effusion and good range of motion.  There is medial joint line tenderness from her arthritic changes.  At this point I can release her from clinic to follow-up as needed.  A disability rating and letter with work  recommendations will be forthcoming.  I also communicated this with her medical case manager who was present.  All questions concerns were answered and addressed.

## 2018-06-06 ENCOUNTER — Telehealth: Payer: Self-pay

## 2018-06-06 NOTE — Telephone Encounter (Signed)
Faxed completed 25r form with 10% rating to the left knee marked along with the 06/01/18 rating letter to the case mgr @ [phone]

## 2018-06-07 ENCOUNTER — Telehealth: Payer: Self-pay | Admitting: Family Medicine

## 2018-06-07 NOTE — Telephone Encounter (Signed)
Patient left VM asking for a call back for Rx refill. Patient did not leave the name of the medication on VM.

## 2018-06-07 NOTE — Telephone Encounter (Signed)
Called pt and left VM asking patient to either call pharmacy or call back with name of medication. Pt is due for 3 month F/U for [facility]. My chart message sent to patient as well.

## 2018-06-10 ENCOUNTER — Telehealth: Payer: Self-pay

## 2018-06-10 NOTE — Telephone Encounter (Signed)
Patient called to advise of a potential exposure at last OV on 05/31/18 at [facility], before I could start the discussion, she says she is at work and to call her back next week.

## 2018-06-12 ENCOUNTER — Ambulatory Visit (INDEPENDENT_AMBULATORY_CARE_PROVIDER_SITE_OTHER): Payer: BLUE CROSS/BLUE SHIELD | Admitting: Family Medicine

## 2018-06-12 ENCOUNTER — Telehealth: Payer: Self-pay | Admitting: Family Medicine

## 2018-06-12 ENCOUNTER — Other Ambulatory Visit: Payer: Self-pay

## 2018-06-12 ENCOUNTER — Encounter: Payer: Self-pay | Admitting: Family Medicine

## 2018-06-12 VITALS — Ht 62.5 in

## 2018-06-12 DIAGNOSIS — R197 Diarrhea, unspecified: Secondary | ICD-10-CM

## 2018-06-12 MED ORDER — DICYCLOMINE HCL 10 MG PO CAPS: 10.0000 mg | ORAL_CAPSULE | Freq: Three times a day (TID) | ORAL | 0 refills | Status: DC

## 2018-06-12 MED ORDER — DIPHENOXYLATE-ATROPINE 2.5-0.025 MG PO TABS: ORAL_TABLET | ORAL | 0 refills | Status: DC

## 2018-06-12 NOTE — Telephone Encounter (Signed)
error 

## 2018-06-12 NOTE — Progress Notes (Signed)
VIRTUAL VISIT VIA VIDEO  I connected with Cathy Baxter on 06/12/18 at  1:00 PM EDT by a video enabled telemedicine application and verified that I am speaking with the correct person using two identifiers. Location patient: Home Location provider: [facility], Office Persons participating in the virtual visit: Patient, Dr. Raoul Pitch and R.Baker, LPN  I discussed the limitations of evaluation and management by telemedicine and the availability of in person appointments. The patient expressed understanding and agreed to proceed.   SUBJECTIVE Chief Complaint  Patient presents with   Diarrhea    Pt states that she is still having diarrhea after 2 weeks. Having some abdominal pain and nausea.  She said she is going approx. up to 15 times a day.     HPI: Cathy Baxter is a 58 y.o. female present for intractable diarrhea since 05/21/2018. She was seen in the ED 05/25/2018 for this illness-reviewed labs and CT.  Patient reports she continues to have watery and loose stools since 05/21/2018.  She has not had a normal stool during this time.  She reports 10-15 bowel movements daily.  She endorses abdominal cramping.  She denies any abdominal pain or chest pain as when she was in the emergency room.  She has a decreased appetite but is tolerating food.  She is drinking water constantly to help keep hydrated.  She reports fatigue.  She states she has missed a good deal of work over this condition.  She denies any fever, chills, nausea, vomiting or bloody stools.Pt denies exposure  to insects, recent travel, under prepared foods, antibiotics  or sick contacts.  Recent Results (from the past 2160 hour(s))  Lipase, blood     Status: None   Collection Time: 03/16/18 10:45 AM  Result Value Ref Range   Lipase 29 11 - 51 U/L    Comment: Performed at [facility], [address] [place], [place] [address]  Comprehensive metabolic panel     Status: None   Collection Time: 03/16/18 10:45 AM   Result Value Ref Range   Sodium 137 135 - 145 mmol/L   Potassium 4.7 3.5 - 5.1 mmol/L   Chloride 107 98 - 111 mmol/L   CO2 23 22 - 32 mmol/L   Glucose, Bld 96 70 - 99 mg/dL   BUN 13 6 - 20 mg/dL   Creatinine, Ser 0.86 0.44 - 1.00 mg/dL   Calcium 9.0 8.9 - 10.3 mg/dL   Total Protein 7.5 6.5 - 8.1 g/dL   Albumin 3.8 3.5 - 5.0 g/dL   AST 31 15 - 41 U/L   ALT 30 0 - 44 U/L   Alkaline Phosphatase 63 38 - 126 U/L   Total Bilirubin 0.6 0.3 - 1.2 mg/dL   GFR calc non Af Amer >60 >60 mL/min   GFR calc Af Amer >60 >60 mL/min   Anion gap 7 5 - 15    Comment: Performed at [facility], [address] [place], [place] [address]  CBC     Status: Abnormal   Collection Time: 03/16/18 10:45 AM  Result Value Ref Range   WBC 8.0 4.0 - 10.5 K/uL   RBC 4.03 3.87 - 5.11 MIL/uL   Hemoglobin 13.2 12.0 - 15.0 g/dL   HCT 40.8 36.0 - 46.0 %   MCV 101.2 (H) 80.0 - 100.0 fL   MCH 32.8 26.0 - 34.0 pg   MCHC 32.4 30.0 - 36.0 g/dL   RDW 11.8 11.5 -  15.5 %   Platelets 390 150 - 400 K/uL   nRBC 0.0 0.0 - 0.2 %    Comment: Performed at [facility], [address] [place], [place] [address]  Urinalysis, Routine w reflex microscopic     Status: Abnormal   Collection Time: 03/16/18 10:45 AM  Result Value Ref Range   Color, Urine YELLOW YELLOW   APPearance CLEAR CLEAR   Specific Gravity, Urine <1.005 (L) 1.005 - 1.030   pH 6.0 5.0 - 8.0   Glucose, UA NEGATIVE NEGATIVE mg/dL   Hgb urine dipstick NEGATIVE NEGATIVE   Bilirubin Urine NEGATIVE NEGATIVE   Ketones, ur NEGATIVE NEGATIVE mg/dL   Protein, ur NEGATIVE NEGATIVE mg/dL   Nitrite NEGATIVE NEGATIVE   Leukocytes,Ua NEGATIVE NEGATIVE    Comment: Microscopic not done on urines with negative protein, blood, leukocytes, nitrite, or glucose < 500 mg/dL. Performed at [facility], [address] [place], [place] [address]   Pregnancy, urine     Status: None   Collection Time: 03/16/18 10:45 AM  Result Value Ref Range    Preg Test, Ur NEGATIVE NEGATIVE    Comment:        THE SENSITIVITY OF THIS METHODOLOGY IS >20 mIU/mL. Performed at [facility], [address] [place], [place] [address]   Anti-scleroderma antibody     Status: None   Collection Time: 03/22/18  9:57 AM  Result Value Ref Range   Scleroderma (Scl-70) (ENA) Antibody, IgG <1.0 NEG <1.0 NEG AI  RNP Antibody     Status: None   Collection Time: 03/22/18  9:57 AM  Result Value Ref Range   Ribonucleic Protein(ENA) Antibody, IgG <1.0 NEG <1.0 NEG AI  Anti-Smith antibody     Status: None   Collection Time: 03/22/18  9:57 AM  Result Value Ref Range   ENA SM Ab Ser-aCnc <1.0 NEG <1.0 NEG AI  Sjogrens syndrome-A extractable nuclear antibody     Status: None   Collection Time: 03/22/18  9:57 AM  Result Value Ref Range   SSA (Ro) (ENA) Antibody, IgG <1.0 NEG <1.0 NEG AI  Sjogrens syndrome-B extractable nuclear antibody     Status: None   Collection Time: 03/22/18  9:57 AM  Result Value Ref Range   SSB (La) (ENA) Antibody, IgG <1.0 NEG <1.0 NEG AI  Anti-DNA antibody, double-stranded     Status: None   Collection Time: 03/22/18  9:57 AM  Result Value Ref Range   ds DNA Ab <1 IU/mL    Comment:                            IU/mL       Interpretation                            < or = 4    Negative                            5-9         Indeterminate                            > or = 10   Positive .   C3 and C4     Status: None   Collection Time: 03/22/18  9:57 AM  Result Value Ref Range   C3 Complement 159 83 - 193 mg/dL   C4 Complement 30 15 - 57 mg/dL  Epstein-Barr virus VCA antibody panel     Status: Abnormal   Collection Time: 03/22/18  9:57 AM  Result Value Ref Range   EBV VCA IgM <36.00 U/mL    Comment:       U/mL              Interpretation       ----              --------------       <36.00            Negative       36.00-43.99       Equivocal       >43.99            Positive    EBV VCA IgG 591.00 (H) U/mL    Comment:         U/mL             Interpretation        ----             --------------        <18.00           Negative        18.00-21.99      Equivocal        >21.99           Positive    EBV NA IgG 567.00 (H) U/mL    Comment:        U/mL             Interpretation        ----             --------------        <18.00           Negative        18.00-21.99      Equivocal        >21.99           Positive    Interpretation      Comment: . Suggestive of a past Epstein-Barr virus infection. In infants, a similar pattern may occur as a result of passive maternal transfer of antibody. .   Basic metabolic panel     Status: None   Collection Time: 05/25/18  1:15 PM  Result Value Ref Range   Sodium 139 135 - 145 mmol/L   Potassium 4.4 3.5 - 5.1 mmol/L   Chloride 106 98 - 111 mmol/L   CO2 24 22 - 32 mmol/L   Glucose, Bld 94 70 - 99 mg/dL   BUN 13 6 - 20 mg/dL   Creatinine, Ser 0.89 0.44 - 1.00 mg/dL   Calcium 8.9 8.9 - 10.3 mg/dL   GFR calc non Af Amer >60 >60 mL/min   GFR calc Af Amer >60 >60 mL/min   Anion gap 9 5 - 15    Comment: Performed at [facility], [place] [address] [place] [address]  CBC     Status: None   Collection Time: 05/25/18  1:15 PM  Result Value Ref Range   WBC 7.3 4.0 - 10.5 K/uL   RBC 4.01 3.87 - 5.11 MIL/uL   Hemoglobin 13.4 12.0 - 15.0 g/dL   HCT 40.1 36.0 - 46.0 %   MCV 100.0 80.0 - 100.0 fL   MCH  33.4 26.0 - 34.0 pg   MCHC 33.4 30.0 - 36.0 g/dL   RDW 11.9 11.5 - 15.5 %   Platelets 382 150 - 400 K/uL   nRBC 0.0 0.0 - 0.2 %    Comment: Performed at [facility], [place] [address], [place], [place] [address]  Troponin I - ONCE - STAT     Status: None   Collection Time: 05/25/18  1:15 PM  Result Value Ref Range   Troponin I <0.03 <0.03 ng/mL    Comment: Performed at [facility] [address] [place], [place] [address]  Lipase, blood     Status: None   Collection Time: 05/25/18  1:15 PM  Result Value Ref Range   Lipase 30 11 - 51  U/L    Comment: Performed at [facility] [address] [place], [place] [address]  Urinalysis, Routine w reflex microscopic     Status: None   Collection Time: 05/25/18  1:20 PM  Result Value Ref Range   Color, Urine YELLOW YELLOW   APPearance CLEAR CLEAR   Specific Gravity, Urine 1.017 1.005 - 1.030   pH 7.0 5.0 - 8.0   Glucose, UA NEGATIVE NEGATIVE mg/dL   Hgb urine dipstick NEGATIVE NEGATIVE   Bilirubin Urine NEGATIVE NEGATIVE   Ketones, ur NEGATIVE NEGATIVE mg/dL   Protein, ur NEGATIVE NEGATIVE mg/dL   Nitrite NEGATIVE NEGATIVE   Leukocytes,Ua NEGATIVE NEGATIVE    Comment: Performed at [facility] [address], [place], [place] [address]  Troponin I - ONCE - STAT     Status: None   Collection Time: 05/25/18  3:52 PM  Result Value Ref Range   Troponin I <0.03 <0.03 ng/mL    Comment: Performed at [facility], [place] [address] [place], [place] [address]  Hepatic function panel     Status: None   Collection Time: 05/25/18  3:52 PM  Result Value Ref Range   Total Protein 6.9 6.5 - 8.1 g/dL   Albumin 3.5 3.5 - 5.0 g/dL   AST 33 15 - 41 U/L   ALT 41 0 - 44 U/L   Alkaline Phosphatase 65 38 - 126 U/L   Total Bilirubin 0.4 0.3 - 1.2 mg/dL   Bilirubin, Direct <0.1 0.0 - 0.2 mg/dL   Indirect Bilirubin NOT CALCULATED 0.3 - 0.9 mg/dL    Comment: Performed at [facility] [address], [place], [place] [address]  Novel Coronavirus,NAA,(SEND-OUT TO REF LAB - TAT 24-48 hrs); Hosp Order     Status: None   Collection Time: 05/25/18  6:47 PM  Result Value Ref Range   SARS-CoV-2, NAA NOT DETECTED NOT DETECTED    Comment: (NOTE) This test was developed and its performance characteristics determined by [employer]. This test has not been FDA cleared or approved. This test has been authorized by FDA under an Emergency Use Authorization (EUA). This test is only authorized for the duration of time the declaration that circumstances exist justifying  the authorization of the emergency use of in vitro diagnostic tests for detection of SARS-CoV-2 virus and/or diagnosis of COVID-19 infection under section 564(b)(1) of the Act, 21 U.S.C. 423NTI-1(W)(4), unless the authorization is terminated or revoked sooner. When diagnostic testing is negative, the possibility of a false negative result should be considered in the context of a patient's recent exposures and the presence of clinical signs and symptoms consistent with COVID-19. An individual without symptoms of COVID-19 and who  is not shedding SARS-CoV-2 virus would expect to have a negative (not detected) result in this assay. Performed  At: [facility] [place], [place] [id number] Rush Farmer MD VF:[id number]    Coronavirus Source NASOPHARYNGEAL     Comment: Performed at [facility], [place] [address] [place], [place] [address]   Dg Chest 2 View  Result Date: 05/25/2018 CLINICAL DATA:  Lower abdominal pain and diarrhea over the last 3 days. Nasal congestion as well. Central chest pain yesterday. EXAM: CHEST - 2 VIEW COMPARISON:  06/01/2017 FINDINGS: Heart size is normal. Mediastinal shadows are normal. There is mild atelectasis in the medial right lower lung visible on the frontal view. No lobar collapse. No sign of consolidation or effusion. No abnormal bone finding. IMPRESSION: Areas of linear atelectasis in the medial right lower lung, not present previously. Electronically Signed   By: Nelson Chimes M.D.   On: 05/25/2018 13:35   Ct Abdomen Pelvis W Contrast  Result Date: 05/25/2018 CLINICAL DATA:  Abdominal distension. EXAM: CT ABDOMEN AND PELVIS WITH CONTRAST TECHNIQUE: Multidetector CT imaging of the abdomen and pelvis was performed using the standard protocol following bolus administration of intravenous contrast. CONTRAST:  167m OMNIPAQUE IOHEXOL 300 MG/ML  SOLN COMPARISON:  CT scan of March 16, 2018. FINDINGS: Lower chest: No acute abnormality.  Hepatobiliary: No focal liver abnormality is seen. Status post cholecystectomy. No biliary dilatation. Pancreas: Unremarkable. No pancreatic ductal dilatation or surrounding inflammatory changes. Spleen: Normal in size without focal abnormality. Adrenals/Urinary Tract: Adrenal glands are unremarkable. Kidneys are normal, without renal calculi, focal lesion, or hydronephrosis. Bladder is unremarkable. Stomach/Bowel: The stomach appears normal. There is no evidence of bowel obstruction or inflammation. The appendix is not visualized. Vascular/Lymphatic: No significant vascular findings are present. No enlarged abdominal or pelvic lymph nodes. Reproductive: Uterus and bilateral adnexa are unremarkable. Other: No abdominal wall hernia or abnormality. No abdominopelvic ascites. Musculoskeletal: No acute or significant osseous findings. IMPRESSION: No significant abnormality seen in the abdomen or pelvis. Electronically Signed   By: JMarijo ConceptionM.D.   On: 05/25/2018 18:15   Ct Abdomen Pelvis W Contrast  Result Date: 03/16/2018 CLINICAL DATA:  Abdominal pain and tenderness EXAM: CT ABDOMEN AND PELVIS WITH CONTRAST TECHNIQUE: Multidetector CT imaging of the abdomen and pelvis was performed using the standard protocol following bolus administration of intravenous contrast. CONTRAST:  1057mOMNIPAQUE IOHEXOL 300 MG/ML  SOLN COMPARISON:  None. FINDINGS: Lower chest: No acute abnormality. Hepatobiliary: Mild fatty infiltration of the liver is noted. The gallbladder has been surgically removed. Pancreas: Unremarkable. No pancreatic ductal dilatation or surrounding inflammatory changes. Spleen: Normal in size without focal abnormality. Adrenals/Urinary Tract: Adrenal glands are within normal limits bilaterally. Kidneys demonstrate a normal enhancement pattern. No obstructive changes are seen. No renal calculi are noted. Delayed images demonstrate normal excretion of contrast. Bladder is partially distended. Stomach/Bowel:  Stomach is within normal limits. Appendix appears normal. No evidence of bowel wall thickening, distention, or inflammatory changes. Vascular/Lymphatic: No significant vascular findings are present. No enlarged abdominal or pelvic lymph nodes. Reproductive: Uterus and bilateral adnexa are unremarkable. Other: No abdominal wall hernia or abnormality. No abdominopelvic ascites. Musculoskeletal: Degenerative changes of the lumbar spine are within normal limits. IMPRESSION: Chronic changes without acute abnormality. Electronically Signed   By: MaInez Catalina.D.   On: 03/16/2018 12:46    ROS: See pertinent positives and negatives per HPI.  Patient Active Problem List   Diagnosis Date Noted   DDD (degenerative disc  disease), lumbar 04/10/2018   Primary osteoarthritis of both knees 04/10/2018   Primary osteoarthritis of both hands 04/10/2018   Non-seasonal allergic rhinitis due to pollen 02/07/2018   Other meniscus derangements, posterior horn of medial meniscus, left knee 11/24/2017   Obesity (BMI 30-39.9) 02/20/2016   Cataract of both eyes 02/20/2016   Pigmentary glaucoma of both eyes    Hiatal hernia 11/06/2015   Hyperlipidemia 11/06/2015   Asthma 11/06/2015   Uterine fibroids in pregnancy, postpartum condition 11/06/2015   Hypothyroidism 10/17/2015    Social History   Tobacco Use   Smoking status: Former Smoker    Packs/day: 3.00    Years: 12.00    Pack years: 36.00    Types: Cigarettes    Last attempt to quit: 07/12/1983    Years since quitting: 34.9   Smokeless tobacco: Never Used  Substance Use Topics   Alcohol use: Yes    Comment: social - 1 every 6 months    Current Outpatient Medications:    brimonidine (ALPHAGAN) 0.2 % ophthalmic solution, , Disp: , Rfl:    Cholecalciferol (VITAMIN D3) 10 MCG (400 UNIT) CAPS, Take by mouth., Disp: , Rfl:    cyanocobalamin 1000 MCG tablet, Take 1,000 mcg by mouth daily., Disp: , Rfl:    dorzolamide-timolol (COSOPT)  22.3-6.8 MG/ML ophthalmic solution, Place 1 drop into both eyes 2 (two) times daily. , Disp: , Rfl:    famotidine (PEPCID) 40 MG tablet, Take 1 tablet (40 mg total) by mouth daily., Disp: 14 tablet, Rfl: 0   fluticasone (FLONASE) 50 MCG/ACT nasal spray, Place 2 sprays into both nostrils daily., Disp: 16 g, Rfl: 6   Fluticasone-Salmeterol (ADVAIR) 100-50 MCG/DOSE AEPB, Inhale 1 puff into the lungs 2 (two) times daily., Disp: 1 each, Rfl: 5   HYDROcodone-acetaminophen (NORCO/VICODIN) 5-325 MG tablet, Take 1-2 tablets by mouth every 6 (six) hours as needed for moderate pain., Disp: 40 tablet, Rfl: 0   levocetirizine (XYZAL) 5 MG tablet, Take 1 tablet (5 mg total) by mouth every evening., Disp: 90 tablet, Rfl: 3   levothyroxine (SYNTHROID, LEVOTHROID) 25 MCG tablet, TAKE 1 TABLET BY MOUTH ONCE DAILY BEFORE BREAKFAST, Disp: 90 tablet, Rfl: 1   montelukast (SINGULAIR) 10 MG tablet, Take 1 tablet (10 mg total) by mouth at bedtime., Disp: 90 tablet, Rfl: 3   dicyclomine (BENTYL) 10 MG capsule, Take 1 capsule (10 mg total) by mouth 4 (four) times daily -  before meals and at bedtime., Disp: 40 capsule, Rfl: 0   diphenoxylate-atropine (LOMOTIL) 2.5-0.025 MG tablet, 2 tab po with onset of diarrhea, then 1 tab QID PRN only. (max daily dose 8 tabs), Disp: 30 tablet, Rfl: 0   pravastatin (PRAVACHOL) 20 MG tablet, Take 1 tablet (20 mg total) by mouth daily., Disp: 90 tablet, Rfl: 3  Allergies  Allergen Reactions   Percocet [Oxycodone-Acetaminophen] Shortness Of Breath   Amoxicillin Hives    Has patient had a PCN reaction causing immediate rash, facial/tongue/throat swelling, SOB or lightheadedness with hypotension: Yes Has patient had a PCN reaction causing severe rash involving mucus membranes or skin necrosis: No Has patient had a PCN reaction that required hospitalization No Has patient had a PCN reaction occurring within the last 10 years: Yes  If all of the above answers are "NO", then may  proceed with Cephalosporin use.    Doxycycline Itching   Oxycodone    Demerol [Meperidine] Palpitations    OBJECTIVE: Ht 5' 2.5" (1.588 m)    BMI 38.52 kg/m  Gen:  No acute distress. Nontoxic in appearance.  HENT: AT. [place].  MMM.  Eyes:Pupils Equal Round Reactive to light, Extraocular movements intact,  Conjunctiva without redness, discharge or icterus. CV: no edema Chest: Cough or shortness of breath not present.  Skin: no rashes, purpura or petechiae.  Neuro:  Normal gait. Alert. Oriented x3  Psych: Normal affect, dress and demeanor. Normal speech. Normal thought content and judgment.  ASSESSMENT AND PLAN: Cathy Baxter is a 58 y.o. female present for  Intractable diarrhea - HYDRATE-Bentyl and Lomotil prescribed with instructions on use. Goal < 5 stools a day. No Alarm symptoms- cramping present.  - start daily G2 for electrolyte replacement.  - Avoid all dairy until  diarrhea completely resolved.  - Clostridium difficile Toxin B, Qualitative, Real-Time PCR; Future - Stool culture; Future - Fecal lactoferrin, quant; Future - dicyclomine (BENTYL) 10 MG capsule; Take 1 capsule (10 mg total) by mouth 4 (four) times daily -  before meals and at bedtime.  Dispense: 40 capsule; Refill: 0 - diphenoxylate-atropine (LOMOTIL) 2.5-0.025 MG tablet; 2 tab po with onset of diarrhea, then 1 tab QID PRN only. (max daily dose 8 tabs)  Dispense: 30 tablet; Refill: 0 - CBC w/Diff; Future - Sedimentation rate; Future - C-reactive protein; Future - Comp Met (CMET); Future - Ova and parasite examination; Future - consider GI referral.  - F/U dependent on lab results.   > 25 minutes spent with patient, >50% of time spent face to face counseling    Howard Pouch, DO 06/12/2018

## 2018-06-12 NOTE — Progress Notes (Signed)
I have discussed the procedure for the virtual visit with the patient who has given consent to proceed with assessment and treatment.   BETHANY DILLARD, CMA     

## 2018-06-14 ENCOUNTER — Ambulatory Visit (INDEPENDENT_AMBULATORY_CARE_PROVIDER_SITE_OTHER): Payer: BC Managed Care – PPO | Admitting: Family Medicine

## 2018-06-14 ENCOUNTER — Other Ambulatory Visit: Payer: Self-pay

## 2018-06-14 DIAGNOSIS — R197 Diarrhea, unspecified: Secondary | ICD-10-CM

## 2018-06-14 NOTE — Addendum Note (Signed)
Addended by: Ralph Dowdy on: 06/14/2018 02:48 PM   Modules accepted: Orders

## 2018-06-14 NOTE — Addendum Note (Signed)
Addended by: Ralph Dowdy on: 06/14/2018 02:49 PM   Modules accepted: Orders

## 2018-06-14 NOTE — Addendum Note (Signed)
Addended by: Ralph Dowdy on: 06/14/2018 02:46 PM   Modules accepted: Orders

## 2018-06-15 ENCOUNTER — Telehealth: Payer: Self-pay | Admitting: Family Medicine

## 2018-06-15 LAB — COMPREHENSIVE METABOLIC PANEL
AG Ratio: 1.4 (calc) (ref 1.0–2.5)
ALT: 35 U/L — ABNORMAL HIGH (ref 6–29)
AST: 28 U/L (ref 10–35)
Albumin: 4.2 g/dL (ref 3.6–5.1)
Alkaline phosphatase (APISO): 67 U/L (ref 37–153)
BUN: 12 mg/dL (ref 7–25)
CO2: 27 mmol/L (ref 20–32)
Calcium: 9.3 mg/dL (ref 8.6–10.4)
Chloride: 104 mmol/L (ref 98–110)
Creat: 0.98 mg/dL (ref 0.50–1.05)
Globulin: 3 g/dL (calc) (ref 1.9–3.7)
Glucose, Bld: 91 mg/dL (ref 65–99)
Potassium: 4.6 mmol/L (ref 3.5–5.3)
Sodium: 139 mmol/L (ref 135–146)
Total Bilirubin: 0.5 mg/dL (ref 0.2–1.2)
Total Protein: 7.2 g/dL (ref 6.1–8.1)

## 2018-06-15 LAB — CBC WITH DIFFERENTIAL/PLATELET
Absolute Monocytes: 707 cells/uL (ref 200–950)
Basophils Absolute: 84 cells/uL (ref 0–200)
Basophils Relative: 1.1 %
Eosinophils Absolute: 410 cells/uL (ref 15–500)
Eosinophils Relative: 5.4 %
HCT: 40 % (ref 35.0–45.0)
Hemoglobin: 13.4 g/dL (ref 11.7–15.5)
Lymphs Abs: 2219 cells/uL (ref 850–3900)
MCH: 32.5 pg (ref 27.0–33.0)
MCHC: 33.5 g/dL (ref 32.0–36.0)
MCV: 97.1 fL (ref 80.0–100.0)
MPV: 9.7 fL (ref 7.5–12.5)
Monocytes Relative: 9.3 %
Neutro Abs: 4180 cells/uL (ref 1500–7800)
Neutrophils Relative %: 55 %
Platelets: 389 10*3/uL (ref 140–400)
RBC: 4.12 10*6/uL (ref 3.80–5.10)
RDW: 11.8 % (ref 11.0–15.0)
Total Lymphocyte: 29.2 %
WBC: 7.6 10*3/uL (ref 3.8–10.8)

## 2018-06-15 LAB — C-REACTIVE PROTEIN: CRP: 2.8 mg/L (ref <8.0)

## 2018-06-15 LAB — SEDIMENTATION RATE: Sed Rate: 14 mm/h (ref 0–30)

## 2018-06-15 NOTE — Telephone Encounter (Signed)
Pt was called and detailed message was left on patients cell phone VM with lab results, okay per [facility]

## 2018-06-15 NOTE — Telephone Encounter (Signed)
Please inform patient the following information: Her blood work returned normal. Will await stool study results.

## 2018-06-23 ENCOUNTER — Other Ambulatory Visit: Payer: Self-pay

## 2018-06-23 ENCOUNTER — Ambulatory Visit: Payer: BC Managed Care – PPO

## 2018-06-23 DIAGNOSIS — R197 Diarrhea, unspecified: Secondary | ICD-10-CM

## 2018-06-27 LAB — FECAL FAT, QUALITATIVE
Fat Qual Neutral, Stl: NORMAL
Fat Qual Total, Stl: NORMAL

## 2018-06-28 ENCOUNTER — Telehealth: Payer: Self-pay | Admitting: Family Medicine

## 2018-06-28 DIAGNOSIS — K529 Noninfective gastroenteritis and colitis, unspecified: Secondary | ICD-10-CM

## 2018-06-28 LAB — STOOL CULTURE
MICRO NUMBER:: 564387
MICRO NUMBER:: 564388
MICRO NUMBER:: 564391
SHIGA RESULT:: NOT DETECTED
SPECIMEN QUALITY:: ADEQUATE
SPECIMEN QUALITY:: ADEQUATE
SPECIMEN QUALITY:: ADEQUATE

## 2018-06-28 LAB — FECAL LACTOFERRIN, QUANT
Fecal Lactoferrin: NEGATIVE
MICRO NUMBER:: 564389
SPECIMEN QUALITY:: ADEQUATE

## 2018-06-28 LAB — OVA AND PARASITE EXAMINATION
CONCENTRATE RESULT:: NONE SEEN
MICRO NUMBER:: 564390
SPECIMEN QUALITY:: ADEQUATE
TRICHROME RESULT:: NONE SEEN

## 2018-06-28 LAB — CLOSTRIDIUM DIFFICILE TOXIN B, QUALITATIVE, REAL-TIME PCR: Toxigenic C. Difficile by PCR: NOT DETECTED

## 2018-06-28 NOTE — Telephone Encounter (Signed)
Pt was called and given information. She verbalized understanding.

## 2018-06-28 NOTE — Telephone Encounter (Signed)
Please inform patient the following information: Her stool studies are all normal. I have referred her to GI for further evaluation.

## 2018-07-07 ENCOUNTER — Ambulatory Visit (INDEPENDENT_AMBULATORY_CARE_PROVIDER_SITE_OTHER): Payer: BC Managed Care – PPO | Admitting: Family Medicine

## 2018-07-07 ENCOUNTER — Other Ambulatory Visit: Payer: Self-pay

## 2018-07-07 ENCOUNTER — Encounter: Payer: Self-pay | Admitting: Family Medicine

## 2018-07-07 VITALS — BP 112/76 | Temp 97.0°F | Ht 62.5 in

## 2018-07-07 DIAGNOSIS — M545 Low back pain, unspecified: Secondary | ICD-10-CM

## 2018-07-07 DIAGNOSIS — M79605 Pain in left leg: Secondary | ICD-10-CM

## 2018-07-07 DIAGNOSIS — M5136 Other intervertebral disc degeneration, lumbar region: Secondary | ICD-10-CM

## 2018-07-07 DIAGNOSIS — M79604 Pain in right leg: Secondary | ICD-10-CM

## 2018-07-07 MED ORDER — NAPROXEN 500 MG PO TABS: 500.0000 mg | ORAL_TABLET | Freq: Two times a day (BID) | ORAL | 0 refills | Status: DC

## 2018-07-07 MED ORDER — CYCLOBENZAPRINE HCL 10 MG PO TABS: 10.0000 mg | ORAL_TABLET | Freq: Two times a day (BID) | ORAL | 0 refills | Status: DC | PRN

## 2018-07-07 NOTE — Progress Notes (Signed)
VIRTUAL VISIT VIA VIDEO  I connected with Cathy Baxter on 07/11/18 at  3:00 PM EDT by a video enabled telemedicine application and verified that I am speaking with the correct person using two identifiers. Location patient: Home Location provider: [facility], Office Persons participating in the virtual visit: Patient, Dr. Raoul Pitch and R.Baker, LPN  I discussed the limitations of evaluation and management by telemedicine and the availability of in person appointments. The patient expressed understanding and agreed to proceed.  Patient Care Team    Relationship Specialty Notifications Start End  Ma Hillock, DO PCP - General Family Medicine  08/30/16   Philemon Kingdom, MD Consulting Physician Internal Medicine  02/20/16    Comment: endocrine  Linda Hedges, DO Consulting Physician Obstetrics and Gynecology  02/20/16   Mcarthur Rossetti, MD Consulting Physician Orthopedic Surgery  07/11/18     SUBJECTIVE Chief Complaint  Patient presents with  . Back Pain    Bilateral leg and hip pain x2 days. No injury.     HPI: Cathy Baxter is a 58 y.o. is present to discuss her back pain. She states she started to have pain in her lumbar spine, which radiated down her bilateral hips and legs to her feet. She reports she has been taking Vicodin she had left over from her surgery for her pain. The pain is present at all times, not matter her activity level- even when sitting. She denies bladder or bowel dysfunction. She denies injury or over activity prior to onset of pain. She denies prior h/o of back surgery.  ROS: See pertinent positives and negatives per HPI.  Patient Active Problem List   Diagnosis Date Noted  . DDD (degenerative disc disease), lumbar 04/10/2018  . 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 Primary osteoarthritis of both hands 04/10/2018  . Non-seasonal allergic rhinitis due to pollen 02/07/2018  . Other meniscus derangements, posterior horn of medial  meniscus, left knee 11/24/2017  . Obesity (BMI 30-39.9) 02/20/2016  . Cataract of both eyes 02/20/2016  . Pigmentary glaucoma of both eyes   . Hiatal hernia 11/06/2015  . Hyperlipidemia 11/06/2015  . Asthma 11/06/2015  . Uterine fibroids in pregnancy, postpartum condition 11/06/2015  . Hypothyroidism 10/17/2015    Social History   Tobacco Use  . Smoking status: Former Smoker    Packs/day: 3.00    Years: 12.00    Pack years: 36.00    Types: Cigarettes    Quit date: 07/12/1983    Years since quitting: 35.0  . Smokeless tobacco: Never Used  Substance Use Topics  . Alcohol use: Yes    Comment: social - 1 every 6 months    Current Outpatient Medications:  .  brimonidine (ALPHAGAN) 0.2 % ophthalmic solution, , Disp: , Rfl:  .  Cholecalciferol (VITAMIN D3) 10 MCG (400 UNIT) CAPS, Take by mouth., Disp: , Rfl:  .  cyanocobalamin 1000 MCG tablet, Take 1,000 mcg by mouth daily., Disp: , Rfl:  .  dicyclomine (BENTYL) 10 MG capsule, Take 1 capsule (10 mg total) by mouth 4 (four) times daily -  before meals and at bedtime., Disp: 40 capsule, Rfl: 0 .  diphenoxylate-atropine (LOMOTIL) 2.5-0.025 MG tablet, 2 tab po with onset of diarrhea, then 1 tab QID PRN only. (max daily dose 8 tabs), Disp: 30 tablet, Rfl: 0 .  dorzolamide-timolol (COSOPT) 22.3-6.8 MG/ML ophthalmic solution, Place 1 drop into both eyes 2 (two) times daily. , Disp: , Rfl:  .  famotidine (PEPCID) 40 MG  tablet, Take 1 tablet (40 mg total) by mouth daily., Disp: 14 tablet, Rfl: 0 .  fluticasone (FLONASE) 50 MCG/ACT nasal spray, Place 2 sprays into both nostrils daily., Disp: 16 g, Rfl: 6 .  Fluticasone-Salmeterol (ADVAIR) 100-50 MCG/DOSE AEPB, Inhale 1 puff into the lungs 2 (two) times daily., Disp: 1 each, Rfl: 5 .  HYDROcodone-acetaminophen (NORCO/VICODIN) 5-325 MG tablet, Take 1-2 tablets by mouth every 6 (six) hours as needed for moderate pain., Disp: 40 tablet, Rfl: 0 .  levocetirizine (XYZAL) 5 MG tablet, Take 1 tablet (5 mg  total) by mouth every evening., Disp: 90 tablet, Rfl: 3 .  levothyroxine (SYNTHROID, LEVOTHROID) 25 MCG tablet, TAKE 1 TABLET BY MOUTH ONCE DAILY BEFORE BREAKFAST, Disp: 90 tablet, Rfl: 1 .  montelukast (SINGULAIR) 10 MG tablet, Take 1 tablet (10 mg total) by mouth at bedtime., Disp: 90 tablet, Rfl: 3 .  pravastatin (PRAVACHOL) 20 MG tablet, Take 1 tablet (20 mg total) by mouth daily., Disp: 90 tablet, Rfl: 3 .  cyclobenzaprine (FLEXERIL) 10 MG tablet, Take 1 tablet (10 mg total) by mouth 2 (two) times daily as needed for muscle spasms., Disp: 60 tablet, Rfl: 0 .  naproxen (NAPROSYN) 500 MG tablet, Take 1 tablet (500 mg total) by mouth 2 (two) times daily with a meal., Disp: 60 tablet, Rfl: 0  Allergies  Allergen Reactions  . Percocet [Oxycodone-Acetaminophen] Shortness Of Breath  . Amoxicillin Hives    Has patient had a PCN reaction causing immediate rash, facial/tongue/throat swelling, SOB or lightheadedness with hypotension: Yes Has patient had a PCN reaction causing severe rash involving mucus membranes or skin necrosis: No Has patient had a PCN reaction that required hospitalization No Has patient had a PCN reaction occurring within the last 10 years: Yes  If all of the above answers are "NO", then may proceed with Cephalosporin use.   Marland Kitchen Doxycycline Itching  . Oxycodone   . Demerol [Meperidine] Palpitations    OBJECTIVE: BP 112/76   Temp (!) 97 F (36.1 C) (Oral)   Ht 5' 2.5" (1.588 m)   BMI 38.52 kg/m  Gen: No acute distress. Nontoxic in appearance.  HENT: AT. [place].  MMM.  Eyes:Pupils Equal Round Reactive to light, Extraocular movements intact,  Conjunctiva without redness, discharge or icterus. Chest: Cough or shortness of breath not present Skin: no rashes, purpura or petechiae.  Neuro:  Normal gait. Alert. Oriented x3  Psych: tearful.Normal affect, dress and demeanor. Normal speech. Normal thought content and judgment.  ASSESSMENT AND PLAN: Cathy Baxter is a 58 y.o.  female present for  DDD (degenerative disc disease), lumbar/Lumbar pain with sciatica - Rest, heat, hot bath/epson salt soaks.  - naproxen, flexeril prescribed.  - steroids contraindicated with her h/o glaucoma.  - F/u 2-3 weeks in person - if pain is not improving xray lumbar spine and may need to return to her ortho team or refer her to neuro.   > 15 minutes spent with patient, > 50% of that time face to face  Howard Pouch, DO 07/11/2018

## 2018-07-11 ENCOUNTER — Encounter: Payer: Self-pay | Admitting: Family Medicine

## 2018-07-19 ENCOUNTER — Ambulatory Visit (INDEPENDENT_AMBULATORY_CARE_PROVIDER_SITE_OTHER): Payer: BC Managed Care – PPO | Admitting: Family Medicine

## 2018-07-19 ENCOUNTER — Other Ambulatory Visit: Payer: Self-pay

## 2018-07-19 ENCOUNTER — Encounter: Payer: Self-pay | Admitting: Family Medicine

## 2018-07-19 VITALS — Ht 62.5 in

## 2018-07-19 DIAGNOSIS — M545 Low back pain, unspecified: Secondary | ICD-10-CM

## 2018-07-19 DIAGNOSIS — M79671 Pain in right foot: Secondary | ICD-10-CM

## 2018-07-19 NOTE — Progress Notes (Signed)
VIRTUAL VISIT VIA VIDEO  I connected with Cathy Baxter on 07/19/18 at  1:30 PM EDT by a video enabled telemedicine application and verified that I am speaking with the correct person using two identifiers. Location patient: Home Location provider: [facility], Office Persons participating in the virtual visit: Patient, Dr. Raoul Pitch and R.Baker, LPN  I discussed the limitations of evaluation and management by telemedicine and the availability of in person appointments. The patient expressed understanding and agreed to proceed.   Patient Care Team    Relationship Specialty Notifications Start End  Ma Hillock, DO PCP - General Family Medicine  08/30/16   Philemon Kingdom, MD Consulting Physician Internal Medicine  02/20/16    Comment: endocrine  Linda Hedges, DO Consulting Physician Obstetrics and Gynecology  02/20/16   Mcarthur Rossetti, MD Consulting Physician Orthopedic Surgery  07/11/18     SUBJECTIVE Chief Complaint  Patient presents with  . Back Pain    Pt states it is better but still comes and goes. Pain does down both legs into feet.     HPI: Cathy Baxter is a 58 y.o. is present to discuss her back pain. Patient presents for follow-up on her back pain seen 07/07/2018.  She reports that it is mildly improved but pain still comes and goes and radiates down both of her legs.  She has been laying on the couch, cannot increase her activity back to normal.  She is taking the muscle relaxer and NSAIDs.  Unable to provide steroid with her history of glaucoma.  She denies bladder or bowel dysfunction.  She also complains of 2 months history of right foot pain.  She states she has a "knot "on her right medial foot below her ankle.  She reports it hurts when she twists her foot inwards. Prior note 07/07/2018:  She states she started to have pain in her lumbar spine, which radiated down her bilateral hips and legs to her feet. She reports she has been taking Vicodin she had  left over from her surgery for her pain. The pain is present at all times, not matter her activity level- even when sitting. She denies bladder or bowel dysfunction. She denies injury or over activity prior to onset of pain. She denies prior h/o of back surgery.  ROS: See pertinent positives and negatives per HPI.  Patient Active Problem List   Diagnosis Date Noted  . DDD (degenerative disc disease), lumbar 04/10/2018  . 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 Primary osteoarthritis of both hands 04/10/2018  . Non-seasonal allergic rhinitis due to pollen 02/07/2018  . Other meniscus derangements, posterior horn of medial meniscus, left knee 11/24/2017  . Obesity (BMI 30-39.9) 02/20/2016  . Cataract of both eyes 02/20/2016  . Pigmentary glaucoma of both eyes   . Hiatal hernia 11/06/2015  . Hyperlipidemia 11/06/2015  . Asthma 11/06/2015  . Uterine fibroids in pregnancy, postpartum condition 11/06/2015  . Hypothyroidism 10/17/2015    Social History   Tobacco Use  . Smoking status: Former Smoker    Packs/day: 3.00    Years: 12.00    Pack years: 36.00    Types: Cigarettes    Quit date: 07/12/1983    Years since quitting: 35.0  . Smokeless tobacco: Never Used  Substance Use Topics  . Alcohol use: Yes    Comment: social - 1 every 6 months    Current Outpatient Medications:  .  brimonidine (ALPHAGAN) 0.2 % ophthalmic solution, , Disp: ,  Rfl:  .  Cholecalciferol (VITAMIN D3) 10 MCG (400 UNIT) CAPS, Take by mouth., Disp: , Rfl:  .  cyanocobalamin 1000 MCG tablet, Take 1,000 mcg by mouth daily., Disp: , Rfl:  .  cyclobenzaprine (FLEXERIL) 10 MG tablet, Take 1 tablet (10 mg total) by mouth 2 (two) times daily as needed for muscle spasms., Disp: 60 tablet, Rfl: 0 .  dicyclomine (BENTYL) 10 MG capsule, Take 1 capsule (10 mg total) by mouth 4 (four) times daily -  before meals and at bedtime., Disp: 40 capsule, Rfl: 0 .  diphenoxylate-atropine (LOMOTIL) 2.5-0.025 MG tablet, 2 tab  po with onset of diarrhea, then 1 tab QID PRN only. (max daily dose 8 tabs), Disp: 30 tablet, Rfl: 0 .  dorzolamide-timolol (COSOPT) 22.3-6.8 MG/ML ophthalmic solution, Place 1 drop into both eyes 2 (two) times daily. , Disp: , Rfl:  .  famotidine (PEPCID) 40 MG tablet, Take 1 tablet (40 mg total) by mouth daily., Disp: 14 tablet, Rfl: 0 .  fluticasone (FLONASE) 50 MCG/ACT nasal spray, Place 2 sprays into both nostrils daily., Disp: 16 g, Rfl: 6 .  Fluticasone-Salmeterol (ADVAIR) 100-50 MCG/DOSE AEPB, Inhale 1 puff into the lungs 2 (two) times daily., Disp: 1 each, Rfl: 5 .  HYDROcodone-acetaminophen (NORCO/VICODIN) 5-325 MG tablet, Take 1-2 tablets by mouth every 6 (six) hours as needed for moderate pain., Disp: 40 tablet, Rfl: 0 .  levocetirizine (XYZAL) 5 MG tablet, Take 1 tablet (5 mg total) by mouth every evening., Disp: 90 tablet, Rfl: 3 .  levothyroxine (SYNTHROID, LEVOTHROID) 25 MCG tablet, TAKE 1 TABLET BY MOUTH ONCE DAILY BEFORE BREAKFAST, Disp: 90 tablet, Rfl: 1 .  montelukast (SINGULAIR) 10 MG tablet, Take 1 tablet (10 mg total) by mouth at bedtime., Disp: 90 tablet, Rfl: 3 .  naproxen (NAPROSYN) 500 MG tablet, Take 1 tablet (500 mg total) by mouth 2 (two) times daily with a meal., Disp: 60 tablet, Rfl: 0 .  pravastatin (PRAVACHOL) 20 MG tablet, Take 1 tablet (20 mg total) by mouth daily., Disp: 90 tablet, Rfl: 3  Allergies  Allergen Reactions  . Percocet [Oxycodone-Acetaminophen] Shortness Of Breath  . Amoxicillin Hives    Has patient had a PCN reaction causing immediate rash, facial/tongue/throat swelling, SOB or lightheadedness with hypotension: Yes Has patient had a PCN reaction causing severe rash involving mucus membranes or skin necrosis: No Has patient had a PCN reaction that required hospitalization No Has patient had a PCN reaction occurring within the last 10 years: Yes  If all of the above answers are "NO", then may proceed with Cephalosporin use.   Marland Kitchen Doxycycline Itching   . Oxycodone   . Demerol [Meperidine] Palpitations    OBJECTIVE: There were no vitals taken for this visit. Gen: Afebrile. No acute distress.  HENT: AT. [place]. MMM MSK: Pt reports continued lumbar back pain and right foot pain with inversion and knot on medial aspect of foot.  Neuro: PERLA. EOMi. Alert. Oriented x3  Psych: Normal affect, dress and demeanor. Normal speech. Normal thought content and judgment..    ASSESSMENT AND PLAN: Reathel Turi is a 58 y.o. female present for  DDD (degenerative disc disease), lumbar/Lumbar pain with sciatica/right foot pain - Continue to Rest, heat, hot bath/epson salt soaks.  - Continue naproxen, flexeril prescribed.  - steroids contraindicated with her h/o glaucoma.  -Lumbar x-ray and right foot x-ray ordered today. -Referral to Ortho/neuro depending upon x-ray results.  If x-rays are normal consider starting stretches and referral to physical therapy. Follow up  dependent upon results.  > 25 minutes spent with patient, >50% of time spent face to face     Howard Pouch, DO 07/19/2018

## 2018-07-20 ENCOUNTER — Telehealth: Payer: Self-pay | Admitting: Family Medicine

## 2018-07-20 NOTE — Telephone Encounter (Signed)
Patient called to state that today her left leg is swollen, more so that her right. She says she is in pain. Patient had virtual visit with Dr. Raoul Pitch yesterday. She has not gone to do xrays and will be going in the next couple of hours.   Please advise and contact. [phone]

## 2018-07-20 NOTE — Telephone Encounter (Signed)
Pt was called and pt woke up this AM with Left leg swollen at her thigh and knee. No bruising. No redness or warm to touch. Increase in pain level since this morning. Pt was advised to go to ED to be evaluated, she verbalized understanding

## 2018-07-24 ENCOUNTER — Telehealth: Payer: Self-pay

## 2018-07-24 NOTE — Telephone Encounter (Signed)
Pt called and LM on nurse VM asking for a note to return to work. Pt went to ED to be evaluated. Pt went to [facility] hospital to be evaluated and pt said she was to keep legs elevated with ice. No blood clots. She was referred to ortho MD by ED MD. Sent records request. Pt is still having knee and ankle pain and would like appt tomorrow afternoon. Pt was scheduled. Sent records request to [facility].

## 2018-07-25 ENCOUNTER — Encounter: Payer: Self-pay | Admitting: Family Medicine

## 2018-07-25 ENCOUNTER — Other Ambulatory Visit: Payer: Self-pay

## 2018-07-25 ENCOUNTER — Ambulatory Visit: Payer: BC Managed Care – PPO | Admitting: Family Medicine

## 2018-07-25 VITALS — BP 121/74 | HR 87 | Temp 97.8°F | Resp 17 | Ht 63.0 in | Wt 209.4 lb

## 2018-07-25 DIAGNOSIS — M79671 Pain in right foot: Secondary | ICD-10-CM

## 2018-07-25 DIAGNOSIS — M170 Bilateral primary osteoarthritis of knee: Secondary | ICD-10-CM

## 2018-07-25 DIAGNOSIS — M23322 Other meniscus derangements, posterior horn of medial meniscus, left knee: Secondary | ICD-10-CM | POA: Diagnosis not present

## 2018-07-25 DIAGNOSIS — M47816 Spondylosis without myelopathy or radiculopathy, lumbar region: Secondary | ICD-10-CM

## 2018-07-25 DIAGNOSIS — M5136 Other intervertebral disc degeneration, lumbar region: Secondary | ICD-10-CM

## 2018-07-25 DIAGNOSIS — M51369 Other intervertebral disc degeneration, lumbar region without mention of lumbar back pain or lower extremity pain: Secondary | ICD-10-CM

## 2018-07-25 MED ORDER — DICLOFENAC SODIUM ER 100 MG PO TB24: 100.0000 mg | ORAL_TABLET | Freq: Every day | ORAL | 3 refills | Status: DC

## 2018-07-25 NOTE — Patient Instructions (Signed)
Stop naproxen. Start diclofenac once a day.  I have referred you to orthopedics and they will call you to schedule.

## 2018-07-25 NOTE — Progress Notes (Signed)
Patient Care Team    Relationship Specialty Notifications Start End  Ma Hillock, DO PCP - General Family Medicine  08/30/16   Philemon Kingdom, MD Consulting Physician Internal Medicine  02/20/16    Comment: endocrine  Linda Hedges, DO Consulting Physician Obstetrics and Gynecology  02/20/16   Mcarthur Rossetti, MD Consulting Physician Orthopedic Surgery  07/11/18     SUBJECTIVE Chief Complaint  Patient presents with  . Follow-up    F/U from ED visit. R foot/ankle. Neg for blood clot. Left knee pain that shoots up left leg. Needs MD to go back to work.     HPI: Cathy Baxter is a 58 y.o. is present to discuss her back pain.She was seen in the ED after our last visit. She called in with unilateral LE swelling and sent to the ED. Patient was seen in the emergency room 07/20/2018.  She reports her emergency room visit she woke up and had severe pain and swelling in her lower extremity.  DVT was ruled out by ultrasound in the emergency room.  X-rays were completed of her right foot: No acute finding.  Plantar spur.  Left knee: Mild narrowing the medial compartment, lumbar spine: Mild degenerative spurring, mid lower lumbar degenerative hypertrophy all levels.  Per the ED note it stated that it referred her to orthopedics, patient has not made an appointment yet and would like a new referral to a local orthopedic and not one at [facility]. Prior note:  Patient presents for follow-up on her back pain seen 07/07/2018.  She reports that it is mildly improved but pain still comes and goes and radiates down both of her legs.  She has been laying on the couch, cannot increase her activity back to normal.  She is taking the muscle relaxer and NSAIDs.  Unable to provide steroid with her history of glaucoma.  She denies bladder or bowel dysfunction.  She also complains of 2 months history of right foot pain.  She states she has a "knot "on her right medial foot below her ankle.  She reports it  hurts when she twists her foot inwards. Prior note 07/07/2018:  She states she started to have pain in her lumbar spine, which radiated down her bilateral hips and legs to her feet. She reports she has been taking Vicodin she had left over from her surgery for her pain. The pain is present at all times, not matter her activity level- even when sitting. She denies bladder or bowel dysfunction. She denies injury or over activity prior to onset of pain. She denies prior h/o of back surgery.  ROS: See pertinent positives and negatives per HPI.  Patient Active Problem List   Diagnosis Date Noted  . DDD (degenerative disc disease), lumbar 04/10/2018  . 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 Primary osteoarthritis of both hands 04/10/2018  . Non-seasonal allergic rhinitis due to pollen 02/07/2018  . Other meniscus derangements, posterior horn of medial meniscus, left knee 11/24/2017  . Obesity (BMI 30-39.9) 02/20/2016  . Cataract of both eyes 02/20/2016  . Pigmentary glaucoma of both eyes   . Hiatal hernia 11/06/2015  . Hyperlipidemia 11/06/2015  . Asthma 11/06/2015  . Uterine fibroids in pregnancy, postpartum condition 11/06/2015  . Hypothyroidism 10/17/2015    Social History   Tobacco Use  . Smoking status: Former Smoker    Packs/day: 3.00    Years: 12.00    Pack years: 36.00    Types: Cigarettes  Quit date: 07/12/1983    Years since quitting: 35.0  . Smokeless tobacco: Never Used  Substance Use Topics  . Alcohol use: Yes    Comment: social - 1 every 6 months    Current Outpatient Medications:  .  brimonidine (ALPHAGAN) 0.2 % ophthalmic solution, , Disp: , Rfl:  .  Cholecalciferol (VITAMIN D3) 10 MCG (400 UNIT) CAPS, Take by mouth., Disp: , Rfl:  .  cyanocobalamin 1000 MCG tablet, Take 1,000 mcg by mouth daily., Disp: , Rfl:  .  cyclobenzaprine (FLEXERIL) 10 MG tablet, Take 1 tablet (10 mg total) by mouth 2 (two) times daily as needed for muscle spasms., Disp: 60  tablet, Rfl: 0 .  dicyclomine (BENTYL) 10 MG capsule, Take 1 capsule (10 mg total) by mouth 4 (four) times daily -  before meals and at bedtime., Disp: 40 capsule, Rfl: 0 .  diphenoxylate-atropine (LOMOTIL) 2.5-0.025 MG tablet, 2 tab po with onset of diarrhea, then 1 tab QID PRN only. (max daily dose 8 tabs), Disp: 30 tablet, Rfl: 0 .  dorzolamide-timolol (COSOPT) 22.3-6.8 MG/ML ophthalmic solution, Place 1 drop into both eyes 2 (two) times daily. , Disp: , Rfl:  .  famotidine (PEPCID) 40 MG tablet, Take 1 tablet (40 mg total) by mouth daily., Disp: 14 tablet, Rfl: 0 .  fluticasone (FLONASE) 50 MCG/ACT nasal spray, Place 2 sprays into both nostrils daily., Disp: 16 g, Rfl: 6 .  Fluticasone-Salmeterol (ADVAIR) 100-50 MCG/DOSE AEPB, Inhale 1 puff into the lungs 2 (two) times daily., Disp: 1 each, Rfl: 5 .  HYDROcodone-acetaminophen (NORCO/VICODIN) 5-325 MG tablet, Take 1-2 tablets by mouth every 6 (six) hours as needed for moderate pain., Disp: 40 tablet, Rfl: 0 .  levocetirizine (XYZAL) 5 MG tablet, Take 1 tablet (5 mg total) by mouth every evening., Disp: 90 tablet, Rfl: 3 .  levothyroxine (SYNTHROID, LEVOTHROID) 25 MCG tablet, TAKE 1 TABLET BY MOUTH ONCE DAILY BEFORE BREAKFAST, Disp: 90 tablet, Rfl: 1 .  montelukast (SINGULAIR) 10 MG tablet, Take 1 tablet (10 mg total) by mouth at bedtime., Disp: 90 tablet, Rfl: 3 .  naproxen (NAPROSYN) 500 MG tablet, Take 1 tablet (500 mg total) by mouth 2 (two) times daily with a meal., Disp: 60 tablet, Rfl: 0 .  pravastatin (PRAVACHOL) 20 MG tablet, Take 1 tablet (20 mg total) by mouth daily., Disp: 90 tablet, Rfl: 3  Allergies  Allergen Reactions  . Percocet [Oxycodone-Acetaminophen] Shortness Of Breath  . Amoxicillin Hives    Has patient had a PCN reaction causing immediate rash, facial/tongue/throat swelling, SOB or lightheadedness with hypotension: Yes Has patient had a PCN reaction causing severe rash involving mucus membranes or skin necrosis: No Has  patient had a PCN reaction that required hospitalization No Has patient had a PCN reaction occurring within the last 10 years: Yes  If all of the above answers are "NO", then may proceed with Cephalosporin use.   Marland Kitchen Doxycycline Itching  . Oxycodone   . Demerol [Meperidine] Palpitations    OBJECTIVE: BP 121/74 (BP Location: Right Arm, Patient Position: Sitting, Cuff Size: Normal)   Pulse 87   Temp 97.8 F (36.6 C) (Temporal)   Resp 17   Ht 5\' 3"  (1.6 m)   Wt 209 lb 6 oz (95 kg)   SpO2 99%   BMI 37.09 kg/m  Gen: Afebrile. No acute distress.  HENT: AT. [place]. MMM. Eyes:Pupils Equal Round Reactive to light, Extraocular movements intact,  Conjunctiva without redness, discharge or icterus. MSK:    Lumbar: No  erythema, no soft tissue swelling.  Tender to palpation diffusely over lower lumbar spine and bilateral SI joints.  Negative straight leg raises.  Neurovascular intact distally.   Left knee: No erythema, no soft tissue swelling.  No effusion present.  Tender to palpation lateral joint line.  No ligament laxity.  Neurovascularly intact distally.   Right foot: No erythema.  Mild soft tissue swelling present over proximal upper foot.  Possible mass structure palpated.  Mild tenderness to palpation over this area. Skin: no bruising,  rashes, purpura or petechiae.  Neuro: Normal gait. PERLA. EOMi. Alert. Oriented x3 Cranial nerves II through XII intact. Muscle strength 5/5 bilateral lower extremity. DTRs equal bilaterally. Psych: Normal affect, dress and demeanor. Normal speech. Normal thought content and judgment.   ASSESSMENT AND PLAN: Cathy Baxter is a 58 y.o. female present for  Right foot pain Refer to orthopedics for further evaluation.  Area of concern with possible masslike structure, cystic vs  Lipoma.  If orthopedics does not feel they can offer assistance, will happily order advanced imaging for her. - AMB referral to orthopedics  DDD (degenerative disc disease), lumbar/Other  meniscus derangements, posterior horn of medial meniscus, left knee/Primary osteoarthritis of both knees - Some improvement in symptoms but still in pain. Discussed referral to local/[place] ortho and she would rather see one in [place] than [place].  Continue to Rest, heat, hot bath/epson salt soaks.  - Voltaren 100 mg QD; DC naproxen-with food. - steroids contraindicated with her h/o glaucoma.  -Lumbar x-ray, right foot  xray and left knee  x-ray reports reviewed from [facility] ED. no imaging available. -Referral to Ortho placed.  - return to work note provided-return to work 07/27/2018  > 25 minutes spent with patient, >50% of time spent face to face    Howard Pouch, DO 07/25/2018

## 2018-07-27 ENCOUNTER — Encounter: Payer: Self-pay | Admitting: Family Medicine

## 2018-07-27 DIAGNOSIS — M79671 Pain in right foot: Secondary | ICD-10-CM | POA: Insufficient documentation

## 2018-07-27 DIAGNOSIS — M47816 Spondylosis without myelopathy or radiculopathy, lumbar region: Secondary | ICD-10-CM | POA: Insufficient documentation

## 2018-08-07 ENCOUNTER — Encounter: Payer: Self-pay | Admitting: Family Medicine

## 2018-09-14 ENCOUNTER — Other Ambulatory Visit: Payer: Self-pay | Admitting: Family Medicine

## 2019-01-16 ENCOUNTER — Ambulatory Visit (INDEPENDENT_AMBULATORY_CARE_PROVIDER_SITE_OTHER): Payer: BC Managed Care – PPO | Admitting: Family Medicine

## 2019-01-16 ENCOUNTER — Other Ambulatory Visit: Payer: Self-pay

## 2019-01-16 ENCOUNTER — Encounter: Payer: Self-pay | Admitting: Family Medicine

## 2019-01-16 VITALS — Temp 98.0°F | Ht 63.0 in

## 2019-01-16 DIAGNOSIS — R05 Cough: Secondary | ICD-10-CM | POA: Diagnosis not present

## 2019-01-16 DIAGNOSIS — R059 Cough, unspecified: Secondary | ICD-10-CM

## 2019-01-16 DIAGNOSIS — R481 Agnosia: Secondary | ICD-10-CM | POA: Diagnosis not present

## 2019-01-16 DIAGNOSIS — R0981 Nasal congestion: Secondary | ICD-10-CM

## 2019-01-16 NOTE — Progress Notes (Signed)
VIRTUAL VISIT VIA VIDEO  I connected with Cathy Baxter on 01/16/19 at  2:30 PM EST by a video enabled telemedicine application and verified that I am speaking with the correct person using two identifiers. Location patient: Home Location provider: [facility], Office Persons participating in the virtual visit: Patient, Dr. Raoul Pitch and R.Baker, LPN  I discussed the limitations of evaluation and management by telemedicine and the availability of in person appointments. The patient expressed understanding and agreed to proceed.   SUBJECTIVE Chief Complaint  Patient presents with  . Cough    Pt is SOB with exertion. Loss of taste. Denies fever. Pt states a lot of people have it at work.   . Fatigue  . Nasal Congestion    HPI: Cathy Baxter is a 59 y.o. female present today to discuss acute illness of cough, loss of taste, fatigue and nasal congestion.  She endorses shortness of breath during activity only.  She denies any lower extremity edema.  She states sometimes her nose is also runny.  She denies any fever, sore throat, headache, muscle aches.  She is having occasional chills.  She reports there are a few people at work who have been diagnosed with Covid.  She is using over-the-counter NyQuil for comfort.  Her symptoms started approximately 3 days ago.  ROS: See pertinent positives and negatives per HPI.  Patient Active Problem List   Diagnosis Date Noted  . Right foot pain 07/27/2018  . 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 DDD (degenerative disc disease), lumbar 04/10/2018  . 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 Primary osteoarthritis of both hands 04/10/2018  . Non-seasonal allergic rhinitis due to pollen 02/07/2018  . Other meniscus derangements, posterior horn of medial meniscus, left knee 11/24/2017  . Obesity (BMI 30-39.9) 02/20/2016  . Cataract of both eyes 02/20/2016  . Pigmentary glaucoma of both eyes   . Hiatal hernia 11/06/2015  .  Hyperlipidemia 11/06/2015  . Asthma 11/06/2015  . Uterine fibroids in pregnancy, postpartum condition 11/06/2015  . Hypothyroidism 10/17/2015    Social History   Tobacco Use  . Smoking status: Former Smoker    Packs/day: 3.00    Years: 12.00    Pack years: 36.00    Types: Cigarettes    Quit date: 07/12/1983    Years since quitting: 35.5  . Smokeless tobacco: Never Used  Substance Use Topics  . Alcohol use: Yes    Comment: social - 1 every 6 months    Current Outpatient Medications:  .  brimonidine (ALPHAGAN) 0.2 % ophthalmic solution, , Disp: , Rfl:  .  Cholecalciferol (VITAMIN D3) 10 MCG (400 UNIT) CAPS, Take by mouth., Disp: , Rfl:  .  cyanocobalamin 1000 MCG tablet, Take 1,000 mcg by mouth daily., Disp: , Rfl:  .  cyclobenzaprine (FLEXERIL) 10 MG tablet, Take 1 tablet (10 mg total) by mouth 2 (two) times daily as needed for muscle spasms., Disp: 60 tablet, Rfl: 0 .  Diclofenac Sodium CR 100 MG 24 hr tablet, Take 1 tablet (100 mg total) by mouth daily., Disp: 90 tablet, Rfl: 3 .  dicyclomine (BENTYL) 10 MG capsule, Take 1 capsule (10 mg total) by mouth 4 (four) times daily -  before meals and at bedtime., Disp: 40 capsule, Rfl: 0 .  diphenoxylate-atropine (LOMOTIL) 2.5-0.025 MG tablet, 2 tab po with onset of diarrhea, then 1 tab QID PRN only. (max daily dose 8 tabs), Disp: 30 tablet, Rfl: 0 .  dorzolamide-timolol (COSOPT) 22.3-6.8 MG/ML  ophthalmic solution, Place 1 drop into both eyes 2 (two) times daily. , Disp: , Rfl:  .  EUTHYROX 25 MCG tablet, TAKE 1 TABLET BY MOUTH ONCE DAILY BEFORE BREAKFAST, Disp: 90 tablet, Rfl: 1 .  famotidine (PEPCID) 40 MG tablet, Take 1 tablet (40 mg total) by mouth daily., Disp: 14 tablet, Rfl: 0 .  Fluticasone-Salmeterol (ADVAIR) 100-50 MCG/DOSE AEPB, Inhale 1 puff into the lungs 2 (two) times daily., Disp: 1 each, Rfl: 5 .  HYDROcodone-acetaminophen (NORCO/VICODIN) 5-325 MG tablet, Take 1-2 tablets by mouth every 6 (six) hours as needed for moderate  pain., Disp: 40 tablet, Rfl: 0 .  levocetirizine (XYZAL) 5 MG tablet, Take 1 tablet (5 mg total) by mouth every evening., Disp: 90 tablet, Rfl: 3 .  naproxen (NAPROSYN) 500 MG tablet, Take 1 tablet (500 mg total) by mouth 2 (two) times daily with a meal., Disp: 60 tablet, Rfl: 0 .  fluticasone (FLONASE) 50 MCG/ACT nasal spray, Place 2 sprays into both nostrils daily. (Patient not taking: Reported on 01/16/2019), Disp: 16 g, Rfl: 6 .  montelukast (SINGULAIR) 10 MG tablet, Take 1 tablet (10 mg total) by mouth at bedtime. (Patient not taking: Reported on 01/16/2019), Disp: 90 tablet, Rfl: 3  Allergies  Allergen Reactions  . Percocet [Oxycodone-Acetaminophen] Shortness Of Breath  . Amoxicillin Hives    Has patient had a PCN reaction causing immediate rash, facial/tongue/throat swelling, SOB or lightheadedness with hypotension: Yes Has patient had a PCN reaction causing severe rash involving mucus membranes or skin necrosis: No Has patient had a PCN reaction that required hospitalization No Has patient had a PCN reaction occurring within the last 10 years: Yes  If all of the above answers are "NO", then may proceed with Cephalosporin use.   Marland Kitchen Doxycycline Itching  . Oxycodone   . Demerol [Meperidine] Palpitations    OBJECTIVE: Temp 98 F (36.7 C) (Temporal)   Ht 5\' 3"  (1.6 m)   BMI 37.09 kg/m  Gen: No acute distress. Nontoxic in appearance.   HENT: AT. .  MMM.  Eyes:Pupils Equal Round Reactive to light, Extraocular movements intact,  Conjunctiva without redness, discharge or icterus. CV: No edema Chest: Cough or shortness of breath not present on exam Skin: No rashes, purpura or petechiae.  Neuro:  Normal gait. Alert. Oriented x3  Psych: Normal affect, dress and demeanor. Normal speech. Normal thought content and judgment.  ASSESSMENT AND PLAN: Cathy Baxter is a 59 y.o. female present for  Nasal congestion/Cough/Loss of perception for taste Rest, hydrate.  OTC supportive therapy  discussed. Covid precautions, self-isolation and emergent precautions discussed today. COVID-19 testing ordered today and instructions provided to patient for scheduling appointment.    Howard Pouch, DO 01/16/2019

## 2019-01-17 ENCOUNTER — Encounter: Payer: Self-pay | Admitting: Family Medicine

## 2019-01-17 ENCOUNTER — Ambulatory Visit: Payer: BC Managed Care – PPO | Attending: Internal Medicine

## 2019-01-17 DIAGNOSIS — Z20822 Contact with and (suspected) exposure to covid-19: Secondary | ICD-10-CM

## 2019-01-17 NOTE — Patient Instructions (Signed)
COVID-19 COVID-19 is a respiratory infection that is caused by a virus called severe acute respiratory syndrome coronavirus 2 (SARS-CoV-2). The disease is also known as coronavirus disease or novel coronavirus. In some people, the virus may not cause any symptoms. In others, it may cause a serious infection. The infection can get worse quickly and can lead to complications, such as:  Pneumonia, or infection of the lungs.  Acute respiratory distress syndrome or ARDS. This is a condition in which fluid build-up in the lungs prevents the lungs from filling with air and passing oxygen into the blood.  Acute respiratory failure. This is a condition in which there is not enough oxygen passing from the lungs to the body or when carbon dioxide is not passing from the lungs out of the body.  Sepsis or septic shock. This is a serious bodily reaction to an infection.  Blood clotting problems.  Secondary infections due to bacteria or fungus.  Organ failure. This is when your body's organs stop working. The virus that causes COVID-19 is contagious. This means that it can spread from person to person through droplets from coughs and sneezes (respiratory secretions). What are the causes? This illness is caused by a virus. You may catch the virus by:  Breathing in droplets from an infected person. Droplets can be spread by a person breathing, speaking, singing, coughing, or sneezing.  Touching something, like a table or a doorknob, that was exposed to the virus (contaminated) and then touching your mouth, nose, or eyes. What increases the risk? Risk for infection You are more likely to be infected with this virus if you:  Are within 6 feet (2 meters) of a person with COVID-19.  Provide care for or live with a person who is infected with COVID-19.  Spend time in crowded indoor spaces or live in shared housing. Risk for serious illness You are more likely to become seriously ill from the virus if you:   Are 50 years of age or older. The higher your age, the more you are at risk for serious illness.  Live in a nursing home or long-term care facility.  Have cancer.  Have a long-term (chronic) disease such as: ? Chronic lung disease, including chronic obstructive pulmonary disease or asthma. ? A long-term disease that lowers your body's ability to fight infection (immunocompromised). ? Heart disease, including heart failure, a condition in which the arteries that lead to the heart become narrow or blocked (coronary artery disease), a disease which makes the heart muscle thick, weak, or stiff (cardiomyopathy). ? Diabetes. ? Chronic kidney disease. ? Sickle cell disease, a condition in which red blood cells have an abnormal "sickle" shape. ? Liver disease.  Are obese. What are the signs or symptoms? Symptoms of this condition can range from mild to severe. Symptoms may appear any time from 2 to 14 days after being exposed to the virus. They include:  A fever or chills.  A cough.  Difficulty breathing.  Headaches, body aches, or muscle aches.  Runny or stuffy (congested) nose.  A sore throat.  New loss of taste or smell. Some people may also have stomach problems, such as nausea, vomiting, or diarrhea. Other people may not have any symptoms of COVID-19. How is this diagnosed? This condition may be diagnosed based on:  Your signs and symptoms, especially if: ? You live in an area with a COVID-19 outbreak. ? You recently traveled to or from an area where the virus is common. ? You   provide care for or live with a person who was diagnosed with COVID-19. ? You were exposed to a person who was diagnosed with COVID-19.  A physical exam.  Lab tests, which may include: ? Taking a sample of fluid from the back of your nose and throat (nasopharyngeal fluid), your nose, or your throat using a swab. ? A sample of mucus from your lungs (sputum). ? Blood tests.  Imaging tests, which  may include, X-rays, CT scan, or ultrasound. How is this treated? At present, there is no medicine to treat COVID-19. Medicines that treat other diseases are being used on a trial basis to see if they are effective against COVID-19. Your health care provider will talk with you about ways to treat your symptoms. For most people, the infection is mild and can be managed at home with rest, fluids, and over-the-counter medicines. Treatment for a serious infection usually takes places in a hospital intensive care unit (ICU). It may include one or more of the following treatments. These treatments are given until your symptoms improve.  Receiving fluids and medicines through an IV.  Supplemental oxygen. Extra oxygen is given through a tube in the nose, a face mask, or a hood.  Positioning you to lie on your stomach (prone position). This makes it easier for oxygen to get into the lungs.  Continuous positive airway pressure (CPAP) or bi-level positive airway pressure (BPAP) machine. This treatment uses mild air pressure to keep the airways open. A tube that is connected to a motor delivers oxygen to the body.  Ventilator. This treatment moves air into and out of the lungs by using a tube that is placed in your windpipe.  Tracheostomy. This is a procedure to create a hole in the neck so that a breathing tube can be inserted.  Extracorporeal membrane oxygenation (ECMO). This procedure gives the lungs a chance to recover by taking over the functions of the heart and lungs. It supplies oxygen to the body and removes carbon dioxide. Follow these instructions at home: Lifestyle  If you are sick, stay home except to get medical care. Your health care provider will tell you how long to stay home. Call your health care provider before you go for medical care.  Rest at home as told by your health care provider.  Do not use any products that contain nicotine or tobacco, such as cigarettes, e-cigarettes, and  chewing tobacco. If you need help quitting, ask your health care provider.  Return to your normal activities as told by your health care provider. Ask your health care provider what activities are safe for you. General instructions  Take over-the-counter and prescription medicines only as told by your health care provider.  Drink enough fluid to keep your urine pale yellow.  Keep all follow-up visits as told by your health care provider. This is important. How is this prevented?  There is no vaccine to help prevent COVID-19 infection. However, there are steps you can take to protect yourself and others from this virus. To protect yourself:   Do not travel to areas where COVID-19 is a risk. The areas where COVID-19 is reported change often. To identify high-risk areas and travel restrictions, check the CDC travel website: wwwnc.cdc.gov/travel/notices  If you live in, or must travel to, an area where COVID-19 is a risk, take precautions to avoid infection. ? Stay away from people who are sick. ? Wash your hands often with soap and water for 20 seconds. If soap and water   are not available, use an alcohol-based hand sanitizer. ? Avoid touching your mouth, face, eyes, or nose. ? Avoid going out in public, follow guidance from your state and local health authorities. ? If you must go out in public, wear a cloth face covering or face mask. Make sure your mask covers your nose and mouth. ? Avoid crowded indoor spaces. Stay at least 6 feet (2 meters) away from others. ? Disinfect objects and surfaces that are frequently touched every day. This may include:  Counters and tables.  Doorknobs and light switches.  Sinks and faucets.  Electronics, such as phones, remote controls, keyboards, computers, and tablets. To protect others: If you have symptoms of COVID-19, take steps to prevent the virus from spreading to others.  If you think you have a COVID-19 infection, contact your health care  provider right away. Tell your health care team that you think you may have a COVID-19 infection.  Stay home. Leave your house only to seek medical care. Do not use public transport.  Do not travel while you are sick.  Wash your hands often with soap and water for 20 seconds. If soap and water are not available, use alcohol-based hand sanitizer.  Stay away from other members of your household. Let healthy household members care for children and pets, if possible. If you have to care for children or pets, wash your hands often and wear a mask. If possible, stay in your own room, separate from others. Use a different bathroom.  Make sure that all people in your household wash their hands well and often.  Cough or sneeze into a tissue or your sleeve or elbow. Do not cough or sneeze into your hand or into the air.  Wear a cloth face covering or face mask. Make sure your mask covers your nose and mouth. Where to find more information  Centers for Disease Control and Prevention: www.cdc.gov/coronavirus/2019-ncov/index.html  World Health Organization: www.who.int/health-topics/coronavirus Contact a health care provider if:  You live in or have traveled to an area where COVID-19 is a risk and you have symptoms of the infection.  You have had contact with someone who has COVID-19 and you have symptoms of the infection. Get help right away if:  You have trouble breathing.  You have pain or pressure in your chest.  You have confusion.  You have bluish lips and fingernails.  You have difficulty waking from sleep.  You have symptoms that get worse. These symptoms may represent a serious problem that is an emergency. Do not wait to see if the symptoms will go away. Get medical help right away. Call your local emergency services (911 in the [place] Do not drive yourself to the hospital. Let the emergency medical personnel know if you think you have COVID-19. Summary  COVID-19 is a  respiratory infection that is caused by a virus. It is also known as coronavirus disease or novel coronavirus. It can cause serious infections, such as pneumonia, acute respiratory distress syndrome, acute respiratory failure, or sepsis.  The virus that causes COVID-19 is contagious. This means that it can spread from person to person through droplets from breathing, speaking, singing, coughing, or sneezing.  You are more likely to develop a serious illness if you are 50 years of age or older, have a weak immune system, live in a nursing home, or have chronic disease.  There is no medicine to treat COVID-19. Your health care provider will talk with you about ways to treat your symptoms.    Take steps to protect yourself and others from infection. Wash your hands often and disinfect objects and surfaces that are frequently touched every day. Stay away from people who are sick and wear a mask if you are sick. This information is not intended to replace advice given to you by your health care provider. Make sure you discuss any questions you have with your health care provider. Document Revised: 10/27/2018 Document Reviewed: 02/02/2018 Elsevier Patient Education  2020 [employer]vier Inc.  

## 2019-01-19 ENCOUNTER — Telehealth: Payer: Self-pay | Admitting: Family Medicine

## 2019-01-19 LAB — NOVEL CORONAVIRUS, NAA: SARS-CoV-2, NAA: NOT DETECTED

## 2019-01-19 NOTE — Telephone Encounter (Signed)
Please inform patient the following information: Her Covid test is negative. This is likely a viral illness and antibiotics will not be effective.  Recommendations are for her to rest, hydrate, take over-the-counter supportive therapy and if her symptoms are not improving or worsening after 7-10 days of illness>>> follow-up at that time to consider antibiotic treatment.

## 2019-01-19 NOTE — Telephone Encounter (Signed)
Pt was called and detailed message was left with results and to return call if needed

## 2019-02-21 ENCOUNTER — Ambulatory Visit (INDEPENDENT_AMBULATORY_CARE_PROVIDER_SITE_OTHER): Payer: BC Managed Care – PPO | Admitting: Family Medicine

## 2019-02-21 ENCOUNTER — Encounter: Payer: Self-pay | Admitting: Family Medicine

## 2019-02-21 VITALS — Temp 97.9°F | Ht 63.0 in

## 2019-02-21 DIAGNOSIS — J4541 Moderate persistent asthma with (acute) exacerbation: Secondary | ICD-10-CM

## 2019-02-21 DIAGNOSIS — J329 Chronic sinusitis, unspecified: Secondary | ICD-10-CM | POA: Diagnosis not present

## 2019-02-21 DIAGNOSIS — B9689 Other specified bacterial agents as the cause of diseases classified elsewhere: Secondary | ICD-10-CM | POA: Diagnosis not present

## 2019-02-21 MED ORDER — CEFDINIR 300 MG PO CAPS: 300.0000 mg | ORAL_CAPSULE | Freq: Two times a day (BID) | ORAL | 0 refills | Status: DC

## 2019-02-21 MED ORDER — MONTELUKAST SODIUM 10 MG PO TABS: 10.0000 mg | ORAL_TABLET | Freq: Every day | ORAL | 3 refills | Status: DC

## 2019-02-21 MED ORDER — PREDNISONE 50 MG PO TABS: 50.0000 mg | ORAL_TABLET | Freq: Every day | ORAL | 0 refills | Status: DC

## 2019-02-21 NOTE — Progress Notes (Signed)
VIRTUAL VISIT VIA VIDEO  I connected with Cathy Baxter on 02/21/19 at  4:00 PM EST by a video enabled telemedicine application and verified that I am speaking with the correct person using two identifiers. Location patient: Home Location provider: [facility], Office Persons participating in the virtual visit: Patient, Dr. Raoul Pitch and R.Baker, LPN  I discussed the limitations of evaluation and management by telemedicine and the availability of in person appointments. The patient expressed understanding and agreed to proceed.   SUBJECTIVE Chief Complaint  Patient presents with  . Sore Throat    x 2-3 days. Denies fever. Pt has not taken Zyrtec at night. Pt has not looked in the back of her throat.   . Fever    HPI: Cathy Baxter is a 59 y.o. female present for acute illness.  Patient reports 2 to 3 days of sore throat and fever.  She endorses nasal congestion, runny nose, bloody nose and maxillary sinus pressure.  She feels her ears have pressure buildup as well.  She endorses sinus headache.  She had an acute viral illness a few weeks ago that had completely resolved.  She now feels that she is having some wheezing in her chest.  ROS: See pertinent positives and negatives per HPI.  Patient Active Problem List   Diagnosis Date Noted  . Right foot pain 07/27/2018  . 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 DDD (degenerative disc disease), lumbar 04/10/2018  . 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 Primary osteoarthritis of both hands 04/10/2018  . Non-seasonal allergic rhinitis due to pollen 02/07/2018  . Other meniscus derangements, posterior horn of medial meniscus, left knee 11/24/2017  . Obesity (BMI 30-39.9) 02/20/2016  . Cataract of both eyes 02/20/2016  . Pigmentary glaucoma of both eyes   . Hiatal hernia 11/06/2015  . Hyperlipidemia 11/06/2015  . Asthma 11/06/2015  . Uterine fibroids in pregnancy, postpartum condition 11/06/2015  .  Hypothyroidism 10/17/2015    Social History   Tobacco Use  . Smoking status: Former Smoker    Packs/day: 3.00    Years: 12.00    Pack years: 36.00    Types: Cigarettes    Quit date: 07/12/1983    Years since quitting: 35.6  . Smokeless tobacco: Never Used  Substance Use Topics  . Alcohol use: Yes    Comment: social - 1 every 6 months    Current Outpatient Medications:  .  brimonidine (ALPHAGAN) 0.2 % ophthalmic solution, , Disp: , Rfl:  .  Cholecalciferol (VITAMIN D3) 10 MCG (400 UNIT) CAPS, Take by mouth., Disp: , Rfl:  .  cyanocobalamin 1000 MCG tablet, Take 1,000 mcg by mouth daily., Disp: , Rfl:  .  cyclobenzaprine (FLEXERIL) 10 MG tablet, Take 1 tablet (10 mg total) by mouth 2 (two) times daily as needed for muscle spasms., Disp: 60 tablet, Rfl: 0 .  Diclofenac Sodium CR 100 MG 24 hr tablet, Take 1 tablet (100 mg total) by mouth daily., Disp: 90 tablet, Rfl: 3 .  dicyclomine (BENTYL) 10 MG capsule, Take 1 capsule (10 mg total) by mouth 4 (four) times daily -  before meals and at bedtime., Disp: 40 capsule, Rfl: 0 .  diphenoxylate-atropine (LOMOTIL) 2.5-0.025 MG tablet, 2 tab po with onset of diarrhea, then 1 tab QID PRN only. (max daily dose 8 tabs), Disp: 30 tablet, Rfl: 0 .  dorzolamide-timolol (COSOPT) 22.3-6.8 MG/ML ophthalmic solution, Place 1 drop into both eyes 2 (two) times daily. , Disp: , Rfl:  .  EUTHYROX 25 MCG tablet, TAKE 1 TABLET BY MOUTH ONCE DAILY BEFORE BREAKFAST, Disp: 90 tablet, Rfl: 1 .  famotidine (PEPCID) 40 MG tablet, Take 1 tablet (40 mg total) by mouth daily., Disp: 14 tablet, Rfl: 0 .  fluticasone (FLONASE) 50 MCG/ACT nasal spray, Place 2 sprays into both nostrils daily., Disp: 16 g, Rfl: 6 .  Fluticasone-Salmeterol (ADVAIR) 100-50 MCG/DOSE AEPB, Inhale 1 puff into the lungs 2 (two) times daily., Disp: 1 each, Rfl: 5 .  levocetirizine (XYZAL) 5 MG tablet, Take 1 tablet (5 mg total) by mouth every evening., Disp: 90 tablet, Rfl: 3 .  montelukast  (SINGULAIR) 10 MG tablet, Take 1 tablet (10 mg total) by mouth at bedtime., Disp: 90 tablet, Rfl: 3 .  naproxen (NAPROSYN) 500 MG tablet, Take 1 tablet (500 mg total) by mouth 2 (two) times daily with a meal., Disp: 60 tablet, Rfl: 0  Allergies  Allergen Reactions  . Percocet [Oxycodone-Acetaminophen] Shortness Of Breath  . Amoxicillin Hives    Has patient had a PCN reaction causing immediate rash, facial/tongue/throat swelling, SOB or lightheadedness with hypotension: Yes Has patient had a PCN reaction causing severe rash involving mucus membranes or skin necrosis: No Has patient had a PCN reaction that required hospitalization No Has patient had a PCN reaction occurring within the last 10 years: Yes  If all of the above answers are "NO", then may proceed with Cephalosporin use.   Marland Kitchen Doxycycline Itching  . Oxycodone   . Demerol [Meperidine] Palpitations    OBJECTIVE: Temp 97.9 F (36.6 C) (Oral)   Ht 5\' 3"  (1.6 m)   BMI 37.09 kg/m  Gen: No acute distress. Nontoxic in appearance.  HENT: AT. [place].  MMM.  Eyes:Pupils Equal Round Reactive to light, Extraocular movements intact,  Conjunctiva without redness, discharge or icterus. Chest: Cough or shortness of breath not present on exam Neuro:  Normal gait. Alert. Oriented x3  Psych: Normal affect, dress and demeanor. Normal speech. Normal thought content and judgment.  ASSESSMENT AND PLAN: Cathy Baxter is a 59 y.o. female present for  Moderate persistent asthma with acute exacerbation/maxillary sinusitis Rest, hydrate.  +/- flonase, mucinex (DM if cough), nettie pot or nasal saline.  Continue OTC antihistamine Continue Singulair Omnicef and prednisone prescribed, take until completed.  If cough present it can last up to 6-8 weeks.  F/U 2 weeks of not improved.    No orders of the defined types were placed in this encounter.  Meds ordered this encounter  Medications  . cefdinir (OMNICEF) 300 MG capsule    Sig: Take 1 capsule  (300 mg total) by mouth 2 (two) times daily.    Dispense:  20 capsule    Refill:  0  . predniSONE (DELTASONE) 50 MG tablet    Sig: Take 1 tablet (50 mg total) by mouth daily with breakfast.    Dispense:  5 tablet    Refill:  0  . montelukast (SINGULAIR) 10 MG tablet    Sig: Take 1 tablet (10 mg total) by mouth at bedtime.    Dispense:  90 tablet    Refill:  [place], DO 02/21/2019

## 2019-02-26 ENCOUNTER — Encounter: Payer: Self-pay | Admitting: Family Medicine

## 2019-02-27 ENCOUNTER — Emergency Department (HOSPITAL_BASED_OUTPATIENT_CLINIC_OR_DEPARTMENT_OTHER)
Admission: EM | Admit: 2019-02-27 | Discharge: 2019-02-27 | Disposition: A | Discharge status: Home / Self Care | Payer: BC Managed Care – PPO | Attending: Emergency Medicine | Admitting: Emergency Medicine

## 2019-02-27 ENCOUNTER — Encounter (HOSPITAL_BASED_OUTPATIENT_CLINIC_OR_DEPARTMENT_OTHER): Payer: Self-pay

## 2019-02-27 ENCOUNTER — Other Ambulatory Visit: Payer: Self-pay

## 2019-02-27 DIAGNOSIS — J45909 Unspecified asthma, uncomplicated: Secondary | ICD-10-CM | POA: Insufficient documentation

## 2019-02-27 DIAGNOSIS — Z87891 Personal history of nicotine dependence: Secondary | ICD-10-CM | POA: Insufficient documentation

## 2019-02-27 DIAGNOSIS — Z79899 Other long term (current) drug therapy: Secondary | ICD-10-CM | POA: Diagnosis not present

## 2019-02-27 DIAGNOSIS — R339 Retention of urine, unspecified: Secondary | ICD-10-CM | POA: Insufficient documentation

## 2019-02-27 DIAGNOSIS — E039 Hypothyroidism, unspecified: Secondary | ICD-10-CM | POA: Diagnosis not present

## 2019-02-27 LAB — URINALYSIS, ROUTINE W REFLEX MICROSCOPIC
Bilirubin Urine: NEGATIVE
Glucose, UA: NEGATIVE mg/dL
Hgb urine dipstick: NEGATIVE
Ketones, ur: NEGATIVE mg/dL
Leukocytes,Ua: NEGATIVE
Nitrite: NEGATIVE
Protein, ur: NEGATIVE mg/dL
Specific Gravity, Urine: >1.03 — ABNORMAL HIGH (ref 1.005–1.030)
pH: 5 (ref 5.0–8.0)

## 2019-02-27 NOTE — ED Triage Notes (Addendum)
Pt states she has been taking abx x 6 days for sore throat and sinus infection-states she did not have covid test at PCP on 2/10-pt states she has had decreased UO with foul smelling urine x today-NAD-steady gait

## 2019-02-27 NOTE — ED Provider Notes (Signed)
[facility] EMERGENCY DEPARTMENT Provider Note   CSN: [id number] Arrival date & time: 02/27/19  1613    History Chief Complaint  Patient presents with  . Urinary Retention    Cathy Baxter is a 59 y.o. female past medical history significant for urinary incontinence, anemia who presents for evaluation of urinary retention.  Patient states she woke up today and went to urinate and was only having "dribbles."  States she did have some malodorous urine with her last urination.  Patient states she was able to urinate here in the emergency department without difficulty.  She is currently on antibiotics for a sore throat which she states feels much improved.  Tolerating p.o. intake at home without difficulty.  Denies fever, chills, nausea, vomiting, chest pain, shortness of breath, abdominal pain, dysuria, hematuria, flank pain.  Denies additional aggravating relieving factors.  History otained from patient and past medical records.  No interpreter is used.  HPI     Past Medical History:  Diagnosis Date  . Allergy   . Anemia   . Arthritis   . Asthma   . Chicken pox   . GERD (gastroesophageal reflux disease)   . Hiatal hernia   . Pigmentary glaucoma of both eyes   . Polyp of stomach   . Shingles   . Thyroid disease   . Urinary incontinence   . Uterine fibroid     Patient Active Problem List   Diagnosis Date Noted  . Right foot pain 07/27/2018  . 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 DDD (degenerative disc disease), lumbar 04/10/2018  . 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 Primary osteoarthritis of both hands 04/10/2018  . Non-seasonal allergic rhinitis due to pollen 02/07/2018  . Other meniscus derangements, posterior horn of medial meniscus, left knee 11/24/2017  . Obesity (BMI 30-39.9) 02/20/2016  . Cataract of both eyes 02/20/2016  . Pigmentary glaucoma of both eyes   . Hiatal hernia 11/06/2015  . Hyperlipidemia 11/06/2015  . Asthma  11/06/2015  . Uterine fibroids in pregnancy, postpartum condition 11/06/2015  . Hypothyroidism 10/17/2015    Past Surgical History:  Procedure Laterality Date  . CHOLECYSTECTOMY  2002  . EYE SURGERY    . KNEE ARTHROSCOPY Left 08/2015  . NASAL SINUS SURGERY  2000     OB History    Gravida  3   Para  3   Term  3   Preterm      AB      Living  2     SAB      TAB      Ectopic      Multiple      Live Births              Family History  Problem Relation Age of Onset  . Heart disease Mother   . Early death Father   . Cancer Son 10       Neuroepithelioma   . Breast cancer Sister 78    Social History   Tobacco Use  . Smoking status: Former Smoker    Packs/day: 3.00    Years: 12.00    Pack years: 36.00    Types: Cigarettes    Quit date: 07/12/1983    Years since quitting: 35.6  . Smokeless tobacco: Never Used  Substance Use Topics  . Alcohol use: Yes    Comment: social   . Drug use: No    Home Medications Prior to Admission medications  Medication Sig Start Date End Date Taking? Authorizing Provider  brimonidine (ALPHAGAN) 0.2 % ophthalmic solution  02/25/18  Yes [provider]  cefdinir (OMNICEF) 300 MG capsule Take 1 capsule (300 mg total) by mouth 2 (two) times daily. 02/21/19  Yes Kuneff, Renee A, DO  dorzolamide-timolol (COSOPT) 22.3-6.8 MG/ML ophthalmic solution Place 1 drop into both eyes 2 (two) times daily.  09/18/15  Yes [provider]  fluticasone (FLONASE) 50 MCG/ACT nasal spray Place 2 sprays into both nostrils daily. 05/09/15  Yes Ena Dawley, Tiffany S, PA-C  levothyroxine (SYNTHROID) 25 MCG tablet Take 25 mcg by mouth daily before breakfast.   Yes [provider]  montelukast (SINGULAIR) 10 MG tablet Take 1 tablet (10 mg total) by mouth at bedtime. 02/21/19  Yes Kuneff, Renee A, DO  predniSONE (DELTASONE) 50 MG tablet Take 1 tablet (50 mg total) by mouth daily with breakfast. 02/21/19  Yes Kuneff, Renee A, DO    Cholecalciferol (VITAMIN D3) 10 MCG (400 UNIT) CAPS Take by mouth.    [provider]  cyanocobalamin 1000 MCG tablet Take 1,000 mcg by mouth daily.    [provider]  cyclobenzaprine (FLEXERIL) 10 MG tablet Take 1 tablet (10 mg total) by mouth 2 (two) times daily as needed for muscle spasms. 07/07/18   Kuneff, Renee A, DO  Diclofenac Sodium CR 100 MG 24 hr tablet Take 1 tablet (100 mg total) by mouth daily. 07/25/18   Kuneff, Renee A, DO  dicyclomine (BENTYL) 10 MG capsule Take 1 capsule (10 mg total) by mouth 4 (four) times daily -  before meals and at bedtime. 06/12/18   Kuneff, Renee A, DO  diphenoxylate-atropine (LOMOTIL) 2.5-0.025 MG tablet 2 tab po with onset of diarrhea, then 1 tab QID PRN only. (max daily dose 8 tabs) 06/12/18   Kuneff, Renee A, DO  famotidine (PEPCID) 40 MG tablet Take 1 tablet (40 mg total) by mouth daily. 03/16/18   Little, Wenda Overland, MD  Fluticasone-Salmeterol (ADVAIR) 100-50 MCG/DOSE AEPB Inhale 1 puff into the lungs 2 (two) times daily. 02/03/18   Kuneff, Renee A, DO  levocetirizine (XYZAL) 5 MG tablet Take 1 tablet (5 mg total) by mouth every evening. 02/03/18   Kuneff, Renee A, DO  naproxen (NAPROSYN) 500 MG tablet Take 1 tablet (500 mg total) by mouth 2 (two) times daily with a meal. 07/07/18   Kuneff, Renee A, DO    Allergies    Percocet [oxycodone-acetaminophen], Amoxicillin, Doxycycline, Oxycodone, and Demerol [meperidine]  Review of Systems   Review of Systems  Constitutional: Negative.   HENT: Negative.   Respiratory: Negative.   Cardiovascular: Negative.   Gastrointestinal: Negative.   Genitourinary: Positive for difficulty urinating. Negative for decreased urine volume, dysuria, flank pain, frequency, genital sores, hematuria, menstrual problem, pelvic pain, urgency, vaginal bleeding, vaginal discharge and vaginal pain.  Musculoskeletal: Negative.   Skin: Negative.   Neurological: Negative.   All other systems reviewed and are  negative.   Physical Exam Updated Vital Signs BP 120/74 (BP Location: Right Arm)   Pulse 60   Temp 98 F (36.7 C) (Oral)   Resp 15   Ht 5\' 3"  (1.6 m)   Wt 90.7 kg   SpO2 100%   BMI 35.43 kg/m   Physical Exam Vitals and nursing note reviewed.  Constitutional:      General: She is not in acute distress.    Appearance: She is well-developed. She is not ill-appearing or toxic-appearing.  HENT:     Head: Normocephalic  and atraumatic.     Jaw: There is normal jaw occlusion.     Comments: Posterior oropharynx clear.  Mucous membranes moist.  Tongue midline without sublingual swelling.  Uvula midline without deviation.  No evidence of PTA or RPA.  No facial swelling.  No drooling, dysphagia or trismus Eyes:     Pupils: Pupils are equal, round, and reactive to light.  Cardiovascular:     Rate and Rhythm: Normal rate.  Pulmonary:     Effort: No respiratory distress.  Abdominal:     General: Bowel sounds are normal. There is no distension.     Palpations: Abdomen is soft.     Tenderness: There is no abdominal tenderness.     Comments: Soft, nontender without rebound or guarding negative CVA tap bilaterally  Musculoskeletal:        General: Normal range of motion.     Cervical back: Normal range of motion.  Skin:    General: Skin is warm and dry.  Neurological:     Mental Status: She is alert.     ED Results / Procedures / Treatments   Labs (all labs ordered are listed, but only abnormal results are displayed) Labs Reviewed  URINALYSIS, ROUTINE W REFLEX MICROSCOPIC - Abnormal; Notable for the following components:      Result Value   Specific Gravity, Urine >1.030 (*)    All other components within normal limits    EKG None  Radiology No results found.  Procedures Procedures (including critical care time)  Medications Ordered in ED Medications - No data to display  ED Course  I have reviewed the triage vital signs and the nursing notes.  Pertinent labs &  imaging results that were available during my care of the patient were reviewed by me and considered in my medical decision making (see chart for details).  59 year old female appears otherwise well presents for evaluation of urinary retention.  Apparently when she woke up this morning was unable to fully empty her bladder.  She is currently on antibiotics for a sinus infection.  Patient states her upper respiratory symptoms are improving.  No evidence of PTA or RPA on exam.  She is tolerating p.o. intake without difficulty.  Her abdomen soft, nontender without rebound or guarding.  Negative CVA tap bilaterally.  She was able to urinate completely empty her bladder while here in the emergency department.  I discussed labs to assess for kidney function however patient declined lab work and any additional testing at this time.  Given she is able to urinate tolerating p.o. intake feels is reasonable.  She is to return to emergency department if she has any new worsening symptoms.  Urinalysis here in the emergency department negative for UTI.  The patient has been appropriately medically screened and/or stabilized in the ED. I have low suspicion for any other emergent medical condition which would require further screening, evaluation or treatment in the ED or require inpatient management.  Patient is hemodynamically stable and in no acute distress.  Patient able to ambulate in department prior to ED.  Evaluation does not show acute pathology that would require ongoing or additional emergent interventions while in the emergency department or further inpatient treatment.  I have discussed the diagnosis with the patient and answered all questions.  Pain is been managed while in the emergency department and patient has no further complaints prior to discharge.  Patient is comfortable with plan discussed in room and is stable for discharge at this time.  I have discussed strict return precautions for returning to the  emergency department.  Patient was encouraged to follow-up with PCP/specialist refer to at discharge.    MDM Rules/Calculators/A&P                       Final Clinical Impression(s) / ED Diagnoses Final diagnoses:  Urinary retention    Rx / DC Orders ED Discharge Orders    None       Brittnye Josephs A, PA-C 02/27/19 2001    Wyvonnia Dusky, MD 02/27/19 2213

## 2019-02-27 NOTE — ED Notes (Addendum)
ED Provider at bedside. 

## 2019-03-14 ENCOUNTER — Other Ambulatory Visit: Payer: Self-pay | Admitting: Family Medicine

## 2019-03-19 ENCOUNTER — Telehealth: Payer: Self-pay

## 2019-03-19 NOTE — Telephone Encounter (Signed)
Patient needs refill of levothyroxine (SYNTHROID) 25 MCG tablet to be sent to [facility].

## 2019-03-19 NOTE — Telephone Encounter (Signed)
Pt was called and told she would need to schedule appt as she has not had he thyroid checked since 01/2018. Pt advised in VM that we could send a short supply to last until scheduled appt.

## 2019-03-20 ENCOUNTER — Ambulatory Visit: Payer: BC Managed Care – PPO | Admitting: Family Medicine

## 2019-03-20 DIAGNOSIS — Z0289 Encounter for other administrative examinations: Secondary | ICD-10-CM

## 2019-03-21 ENCOUNTER — Ambulatory Visit: Payer: BC Managed Care – PPO | Admitting: Family Medicine

## 2019-03-21 ENCOUNTER — Other Ambulatory Visit: Payer: Self-pay

## 2019-03-21 ENCOUNTER — Encounter: Payer: Self-pay | Admitting: Family Medicine

## 2019-03-21 VITALS — BP 101/68 | HR 74 | Temp 98.2°F | Resp 18 | Ht 63.0 in | Wt 212.2 lb

## 2019-03-21 DIAGNOSIS — M170 Bilateral primary osteoarthritis of knee: Secondary | ICD-10-CM

## 2019-03-21 DIAGNOSIS — E039 Hypothyroidism, unspecified: Secondary | ICD-10-CM

## 2019-03-21 DIAGNOSIS — J301 Allergic rhinitis due to pollen: Secondary | ICD-10-CM | POA: Diagnosis not present

## 2019-03-21 DIAGNOSIS — E669 Obesity, unspecified: Secondary | ICD-10-CM | POA: Diagnosis not present

## 2019-03-21 DIAGNOSIS — M5136 Other intervertebral disc degeneration, lumbar region: Secondary | ICD-10-CM

## 2019-03-21 DIAGNOSIS — J4541 Moderate persistent asthma with (acute) exacerbation: Secondary | ICD-10-CM

## 2019-03-21 DIAGNOSIS — H409 Unspecified glaucoma: Secondary | ICD-10-CM | POA: Insufficient documentation

## 2019-03-21 DIAGNOSIS — M19042 Primary osteoarthritis, left hand: Secondary | ICD-10-CM

## 2019-03-21 DIAGNOSIS — M19041 Primary osteoarthritis, right hand: Secondary | ICD-10-CM

## 2019-03-21 MED ORDER — FLUTICASONE PROPIONATE 50 MCG/ACT NA SUSP: 2.0000 | Freq: Every day | NASAL | 6 refills | Status: DC

## 2019-03-21 MED ORDER — LEVOTHYROXINE SODIUM 25 MCG PO TABS: 25.0000 ug | ORAL_TABLET | Freq: Every day | ORAL | 0 refills | Status: DC

## 2019-03-21 MED ORDER — DICLOFENAC SODIUM ER 100 MG PO TB24: 100.0000 mg | ORAL_TABLET | Freq: Every day | ORAL | 3 refills | Status: DC

## 2019-03-21 MED ORDER — LEVOCETIRIZINE DIHYDROCHLORIDE 5 MG PO TABS: 5.0000 mg | ORAL_TABLET | Freq: Every evening | ORAL | 3 refills | Status: DC

## 2019-03-21 MED ORDER — FLUTICASONE-SALMETEROL 100-50 MCG/DOSE IN AEPB: 1.0000 | INHALATION_SPRAY | Freq: Two times a day (BID) | RESPIRATORY_TRACT | 5 refills | Status: DC

## 2019-03-21 MED ORDER — MONTELUKAST SODIUM 10 MG PO TABS: 10.0000 mg | ORAL_TABLET | Freq: Every day | ORAL | 3 refills | Status: DC

## 2019-03-21 NOTE — Patient Instructions (Signed)
I have refilled your medications for you.  Make sure to start your allergy medications. Make sure to NOT use steroid nasal sprays (flonase) or oral steroids with your glaucoma.  I will call you with lab results and if needed we will increase your thyroid medication.

## 2019-03-21 NOTE — Progress Notes (Addendum)
This visit occurred during the SARS-CoV-2 public health emergency.  Safety protocols were in place, including screening questions prior to the visit, additional usage of staff PPE, and extensive cleaning of exam room while observing appropriate contact time as indicated for disinfecting solutions.    Cathy Baxter , 27-Aug-1960, 59 y.o., female MRN: [id number] Patient Care Team    Relationship Specialty Notifications Start End  Ma Hillock, DO PCP - General Family Medicine  08/30/16   Philemon Kingdom, MD Consulting Physician Internal Medicine  02/20/16    Comment: endocrine  Linda Hedges, DO Consulting Physician Obstetrics and Gynecology  02/20/16   Mcarthur Rossetti, MD Consulting Physician Orthopedic Surgery  07/11/18     Chief Complaint  Patient presents with  . Hypothyroidism    Pt needs refills on thyroid medications. Pt has severe fatigue and weight gain.      Subjective: Pt presents for an OV  To discuss [facility] Hypothyroidism, unspecified type/obesity Pt reports compliance of levothyroxine 25 mcg. She reports increased fatigue and weight gain.  Moderate persistent asthma with acute exacerbation/Non-seasonal allergic rhinitis due to pollen Pt reports she has not been routinely taking her xyzal or advair, but wants to restart  Primary osteoarthritis of both knees/Primary osteoarthritis of both hands/DDD (degenerative disc disease), lumbar Pt reports Voltaren has been helpful.  Depression screen [facility] 2/9 07/25/2018 06/01/2017 02/20/2016  Decreased Interest 0 0 0  Down, Depressed, Hopeless 0 0 0  PHQ - 2 Score 0 0 0    Allergies  Allergen Reactions  . Percocet [Oxycodone-Acetaminophen] Shortness Of Breath  . Amoxicillin Hives    Has patient had a PCN reaction causing immediate rash, facial/tongue/throat swelling, SOB or lightheadedness with hypotension: Yes Has patient had a PCN reaction causing severe rash involving mucus membranes or skin necrosis: No Has patient  had a PCN reaction that required hospitalization No Has patient had a PCN reaction occurring within the last 10 years: Yes  If all of the above answers are "NO", then may proceed with Cephalosporin use.   Marland Kitchen Doxycycline Itching  . Oxycodone   . 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   . Demerol [Meperidine] Palpitations   Social History   Social History Narrative   Divorced. She has had 3 children, one passed away young from cancer.    11th grade education. Works at [employer].    Former smoker.   Drinks caffeine.    Wears seatbelt, smoke detector in the home.    Firearms in the home.    Feels safe in her relationships.    Past Medical History:  Diagnosis Date  . Allergy   . Anemia   . Arthritis   . Asthma   . Chicken pox   . GERD (gastroesophageal reflux disease)   . Hiatal hernia   . Pigmentary glaucoma of both eyes   . Polyp of stomach   . Shingles   . Thyroid disease   . Urinary incontinence   . Uterine fibroid   . Uterine fibroids in pregnancy, postpartum condition 11/06/2015   Past Surgical History:  Procedure Laterality Date  . CHOLECYSTECTOMY  2002  . EYE SURGERY    . KNEE ARTHROSCOPY Left 08/2015  . NASAL SINUS SURGERY  2000   Family History  Problem Relation Age of Onset  . Heart disease Mother   . Early death Father   . Cancer Son 10       Neuroepithelioma   . Breast cancer Sister 25  Allergies as of 03/21/2019      Reactions   Percocet [oxycodone-acetaminophen] Shortness Of Breath   Amoxicillin Hives   Has patient had a PCN reaction causing immediate rash, facial/tongue/throat swelling, SOB or lightheadedness with hypotension: Yes Has patient had a PCN reaction causing severe rash involving mucus membranes or skin necrosis: No Has patient had a PCN reaction that required hospitalization No Has patient had a PCN reaction occurring within the last 10 years: Yes  If all of the above answers are "NO", then may proceed with Cephalosporin use.    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    Demerol [meperidine] Palpitations      Medication List       Accurate as of March 21, 2019 11:59 PM. If you have any questions, ask your nurse or doctor.        STOP taking these medications   cefdinir 300 MG capsule Commonly known as: OMNICEF Stopped by: Howard Pouch, DO   cyclobenzaprine 10 MG tablet Commonly known as: FLEXERIL Stopped by: Howard Pouch, DO   dicyclomine 10 MG capsule Commonly known as: BENTYL Stopped by: Howard Pouch, DO   diphenoxylate-atropine 2.5-0.025 MG tablet Commonly known as: Lomotil Stopped by: Howard Pouch, DO   fluticasone 50 MCG/ACT nasal spray Commonly known as: FLONASE Stopped by: Howard Pouch, DO   naproxen 500 MG tablet Commonly known as: Naprosyn Stopped by: Howard Pouch, DO   predniSONE 50 MG tablet Commonly known as: DELTASONE Stopped by: Howard Pouch, DO     TAKE these medications   brimonidine 0.2 % ophthalmic solution Commonly known as: ALPHAGAN   cyanocobalamin 1000 MCG tablet Take 1,000 mcg by mouth daily.   Diclofenac Sodium CR 100 MG 24 hr tablet Take 1 tablet (100 mg total) by mouth daily.   dorzolamide-timolol 22.3-6.8 MG/ML ophthalmic solution Commonly known as: COSOPT Place 1 drop into both eyes 2 (two) times daily.   famotidine 40 MG tablet Commonly known as: PEPCID Take 1 tablet (40 mg total) by mouth daily.   Fluticasone-Salmeterol 100-50 MCG/DOSE Aepb Commonly known as: ADVAIR Inhale 1 puff into the lungs 2 (two) times daily.   levocetirizine 5 MG tablet Commonly known as: Xyzal Take 1 tablet (5 mg total) by mouth every evening.   levothyroxine 25 MCG tablet Commonly known as: SYNTHROID Take 1 tablet (25 mcg total) by mouth daily before breakfast.   montelukast 10 MG tablet Commonly known as: SINGULAIR Take 1 tablet (10 mg total) by mouth at bedtime.   Vitamin D3 10 MCG (400 UNIT) Caps Take by mouth.        All past medical history, surgical history, allergies, family history, immunizations andmedications were updated in the EMR today and reviewed under the history and medication portions of their EMR.     ROS: Negative, with the exception of above mentioned in HPI   Objective:  BP 101/68 (BP Location: Right Arm, Patient Position: Sitting, Cuff Size: Normal)   Pulse 74   Temp 98.2 F (36.8 C) (Temporal)   Resp 18   Ht 5\' 3"  (1.6 m)   Wt 212 lb 4 oz (96.3 kg)   LMP  (Exact Date)   SpO2 98%   BMI 37.60 kg/m  Body mass index is 37.6 kg/m. Gen: Afebrile. No acute distress. Nontoxic in appearance, well developed, well nourished.  HENT: AT. [place].  Eyes:Pupils Equal Round Reactive to light, Extraocular movements intact,  Conjunctiva without redness, discharge or icterus.  Neck/lymp/endocrine: Supple,no lymphadenopathy CV: RRR, no edema Chest: CTAB, no wheeze or crackles. Good air movement, normal resp effort.  Skin: no rashes, purpura or petechiae.  Neuro:  Normal gait. PERLA. EOMi. Alert. Oriented x3  Psych: Normal affect, dress and demeanor. Normal speech. Normal thought content and judgment.  No exam data present No results found. Results for orders placed or performed in visit on 03/21/19 (from the past 24 hour(s))  TSH     Status: None   Collection Time: 03/21/19  2:45 PM  Result Value Ref Range   TSH 2.29 0.40 - 4.50 mIU/L  T4, free     Status: None   Collection Time: 03/21/19  2:45 PM  Result Value Ref Range   Free T4 1.2 0.8 - 1.8 ng/dL    Assessment/Plan: Daianna Dungy is a 59 y.o. female present for OV for  Hypothyroidism, unspecified type/obesity Refilled levothyroxine 25 mcg a day #30. Advised her to take on an empty stomach. If appropriate after results will increase dose  - TSH - T4, free  Moderate persistent asthma with acute exacerbation/Non-seasonal allergic rhinitis due to pollen Stable.  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   Primary osteoarthritis of both knees/Primary osteoarthritis of both hands/DDD (degenerative disc disease), lumbar Stable Continue Voltaren.    Reviewed expectations re: course of current medical issues.  Discussed self-management of symptoms.  Outlined signs and symptoms indicating need for more acute intervention.  Patient verbalized understanding and all questions were answered.  Patient received an After-Visit Summary.  f/u 12 months  Orders Placed This Encounter  Procedures  . TSH  . T4, free   Meds ordered this encounter  Medications  . DISCONTD: levothyroxine (SYNTHROID) 25 MCG tablet    Sig: Take 1 tablet (25 mcg total) by mouth daily before breakfast.    Dispense:  30 tablet    Refill:  0  . montelukast (SINGULAIR) 10 MG tablet    Sig: Take 1 tablet (10 mg total) by mouth at bedtime.    Dispense:  90 tablet    Refill:  3  . levocetirizine (XYZAL) 5 MG tablet    Sig: Take 1 tablet (5 mg total) by mouth every evening.    Dispense:  90 tablet    Refill:  3  . Diclofenac Sodium CR 100 MG 24 hr tablet    Sig: Take 1 tablet (100 mg total) by mouth daily.    Dispense:  90 tablet    Refill:  3  . DISCONTD: fluticasone (FLONASE) 50 MCG/ACT nasal spray    Sig: Place 2 sprays into both nostrils daily.    Dispense:  16 g    Refill:  6  . Fluticasone-Salmeterol (ADVAIR) 100-50 MCG/DOSE AEPB    Sig: Inhale 1 puff into the lungs 2 (two) times daily.    Dispense:  1 each    Refill:  5  . levothyroxine (SYNTHROID) 25 MCG tablet    Sig: Take 1 tablet (25 mcg total) by mouth daily before breakfast.    Dispense:  90 tablet    Refill:  3   Referral Orders  No referral(s) requested today     Note is dictated utilizing voice recognition software. Although note has been proof read prior to signing, occasional typographical errors still can be missed. If any questions arise, please do not hesitate to call for verification.    electronically signed by:  Howard Pouch, DO  [facility] [place]

## 2019-03-22 ENCOUNTER — Encounter: Payer: Self-pay | Admitting: Family Medicine

## 2019-03-22 LAB — T4, FREE: Free T4: 1.2 ng/dL (ref 0.8–1.8)

## 2019-03-22 LAB — TSH: TSH: 2.29 mIU/L (ref 0.40–4.50)

## 2019-03-22 MED ORDER — LEVOTHYROXINE SODIUM 25 MCG PO TABS: 25.0000 ug | ORAL_TABLET | Freq: Every day | ORAL | 3 refills | Status: DC

## 2019-03-22 NOTE — Addendum Note (Signed)
Addended by: Howard Pouch A on: 03/22/2019 07:26 AM   Modules accepted: Orders

## 2019-08-15 ENCOUNTER — Telehealth: Payer: Self-pay

## 2019-08-15 NOTE — Telephone Encounter (Signed)
Received disability paperwork from [employer].

## 2019-08-15 NOTE — Telephone Encounter (Signed)
Received disability/leave of absence paperwork to be completed for Cathy Baxter on 08/15/2019.   Patient last seen by this provider 03/21/2019. Leave of absence request is from 06/20/2019 - 07/19/2019.  Condition requiring leave of absence is unknown.  Patient was not seen during that time by this physician.    Paperwork will need to be completed by the treating physician.  Please make patient aware. Thanks.

## 2019-08-15 NOTE — Telephone Encounter (Signed)
Called and spoke with patient and informed her of below message.  Patient was okay with MD response.  Per patient, will contact job and have them to fax the form to the correct office and to please shred copy that we currently have.

## 2019-08-16 ENCOUNTER — Telehealth: Payer: Self-pay | Admitting: Orthopaedic Surgery

## 2019-08-16 NOTE — Telephone Encounter (Signed)
Received [employer]PS form from [place] stating patient requested leave of absence and disability pay. I returned advised patient last seen and released 05/31/18, unable to complete form.

## 2019-10-09 ENCOUNTER — Telehealth (INDEPENDENT_AMBULATORY_CARE_PROVIDER_SITE_OTHER): Payer: Self-pay | Admitting: Family Medicine

## 2019-10-09 ENCOUNTER — Encounter: Payer: Self-pay | Admitting: Family Medicine

## 2019-10-09 VITALS — Temp 98.0°F

## 2019-10-09 DIAGNOSIS — R059 Cough, unspecified: Secondary | ICD-10-CM

## 2019-10-09 DIAGNOSIS — R0981 Nasal congestion: Secondary | ICD-10-CM

## 2019-10-09 DIAGNOSIS — J45901 Unspecified asthma with (acute) exacerbation: Secondary | ICD-10-CM

## 2019-10-09 DIAGNOSIS — R05 Cough: Secondary | ICD-10-CM

## 2019-10-09 MED ORDER — AZITHROMYCIN 250 MG PO TABS: ORAL_TABLET | ORAL | 0 refills | Status: DC

## 2019-10-09 MED ORDER — BENZONATATE 100 MG PO CAPS: 100.0000 mg | ORAL_CAPSULE | Freq: Three times a day (TID) | ORAL | 0 refills | Status: DC | PRN

## 2019-10-09 MED ORDER — ALBUTEROL SULFATE HFA 108 (90 BASE) MCG/ACT IN AERS: 2.0000 | INHALATION_SPRAY | Freq: Four times a day (QID) | RESPIRATORY_TRACT | 0 refills | Status: DC | PRN

## 2019-10-09 NOTE — Patient Instructions (Addendum)
   ---------------------------------------------------------------------------------------------------------------------------      WORK SLIP:  Patient Cathy Baxter,  1960-11-25, was seen for a medical visit today, 10/09/19 . Please excuse from work according to the [facility] guidelines for a COVID like illness. We advise 10 days minimum from the onset of symptoms (10/07/19) PLUS 1 day of no fever and improved symptoms. Will defer to employer for a sooner return to work if [place] testing is negative and the symptoms have resolved. Advise following CDC guidelines.    Sincerely: E-signature: Dr. Colin Benton, DO [place] Ph: [phone]   -----------------------------------------------------------------------------------------------------------------------------  -Schedule follow-up with your primary care doctor at the end of this week  -stay home while sick, and if you have [place] please stay home for a full 10 days since the onset of symptoms PLUS one day of no fever and feeling better  -[place] COVID19 testing information (follow up with your Primary Care Doctor if positive test): [email or web address] OR [phone]  -I sent the medication(s) we discussed to your pharmacy: Meds ordered this encounter  Medications  . albuterol (PROAIR HFA) 108 (90 Base) MCG/ACT inhaler    Sig: Inhale 2 puffs into the lungs every 6 (six) hours as needed for wheezing or shortness of breath.    Dispense:  1 each    Refill:  0  . azithromycin (ZITHROMAX) 250 MG tablet    Sig: 2 tabs day 1, then one tab daily    Dispense:  6 tablet    Refill:  0  . benzonatate (TESSALON PERLES) 100 MG capsule    Sig: Take 1 capsule (100 mg total) by mouth 3 (three) times daily as needed.    Dispense:  20 capsule    Refill:  0    -can use tylenol or aleve if needed for fevers, aches and pains per instructions  -can use nasal saline a few times per  day if nasal congestion, sometime a short course of Afrin nasal spray for 3 days can help as well  -stay hydrated, drink plenty of fluids and eat small healthy meals - avoid dairy  -can take 1000 IU Vit D3 and Vit C lozenges per instructions  -check out the CDC website for more information on home care, transmission and treatment for COVID19  -follow up with your doctor in 2-3 days unless improving and feeling better  I hope you are feeling better soon! Seek in-person care or a follow up telemedicine visit promptly if your symptoms worsen, new concerns arise or you are not improving as expected. Call 911 if severe symptoms.

## 2019-10-09 NOTE — Progress Notes (Signed)
Virtual Visit via Video Note  I connected with Cathy Baxter  on 10/09/19 at  5:40 PM EDT by a video enabled telemedicine application and verified that I am speaking with the correct person using two identifiers.  Location patient: home, [place] Location provider:work or home office Persons participating in the virtual visit: patient, provider  I discussed the limitations of evaluation and management by telemedicine and the availability of in person appointments. The patient expressed understanding and agreed to proceed.   HPI:  Acute telemedicine visit for : -Onset: 2-3 days worse, though had a lot of allergy sinus congestion for the last few weeks -Symptoms include: sinus congestion worsening, pain in the maxillary sinuses, cough, PND, feeling tired, drainage in the throat, no SOB - but feels like can't breath through sinuses, chills, body aches, decreased appetite, occ mild SOB which she reports gets wth her asthma when sick -Denies: fevers, NVD, loss of taste or smell, inability to get out of bed/eat/drink -Has tried: zyrtec -Pertinent past medical history: has hx of asthma - only uses advair as needed as feels makes her feel worse, did use her advair inhaler 2 days ago, reports does not have an albuterol inhaler -reports hx of sinus infections and remote sinus surgery, thinks has a sinus infection -Pertinent medication allergies: doxycycline and prednisone (has severe glaucoma and the eye doctor told her to not take steroids - but she has been on a low dose when sick with resp illness) -COVID-19 vaccine status: fully vaccinated -works at [employer] and around a lot of sick people   ROS: See pertinent positives and negatives per HPI.  Past Medical History:  Diagnosis Date  . Allergy   . Anemia   . Arthritis   . Asthma   . Chicken pox   . GERD (gastroesophageal reflux disease)   . Hiatal hernia   . Pigmentary glaucoma of both eyes   . Polyp of stomach   . Shingles   . Thyroid disease   .  Urinary incontinence   . Uterine fibroid   . Uterine fibroids in pregnancy, postpartum condition 11/06/2015    Past Surgical History:  Procedure Laterality Date  . CHOLECYSTECTOMY  2002  . EYE SURGERY    . KNEE ARTHROSCOPY Left 08/2015  . NASAL SINUS SURGERY  2000     Current Outpatient Medications:  .  albuterol (PROAIR HFA) 108 (90 Base) MCG/ACT inhaler, Inhale 2 puffs into the lungs every 6 (six) hours as needed for wheezing or shortness of breath., Disp: 1 each, Rfl: 0 .  azithromycin (ZITHROMAX) 250 MG tablet, 2 tabs day 1, then one tab daily, Disp: 6 tablet, Rfl: 0 .  benzonatate (TESSALON PERLES) 100 MG capsule, Take 1 capsule (100 mg total) by mouth 3 (three) times daily as needed., Disp: 20 capsule, Rfl: 0 .  brimonidine (ALPHAGAN) 0.2 % ophthalmic solution, , Disp: , Rfl:  .  Cholecalciferol (VITAMIN D3) 10 MCG (400 UNIT) CAPS, Take by mouth., Disp: , Rfl:  .  cyanocobalamin 1000 MCG tablet, Take 1,000 mcg by mouth daily., Disp: , Rfl:  .  Diclofenac Sodium CR 100 MG 24 hr tablet, Take 1 tablet (100 mg total) by mouth daily., Disp: 90 tablet, Rfl: 3 .  dorzolamide-timolol (COSOPT) 22.3-6.8 MG/ML ophthalmic solution, Place 1 drop into both eyes 2 (two) times daily. , Disp: , Rfl:  .  famotidine (PEPCID) 40 MG tablet, Take 1 tablet (40 mg total) by mouth daily., Disp: 14 tablet, Rfl: 0 .  Fluticasone-Salmeterol (ADVAIR) 100-50  MCG/DOSE AEPB, Inhale 1 puff into the lungs 2 (two) times daily., Disp: 1 each, Rfl: 5 .  levocetirizine (XYZAL) 5 MG tablet, Take 1 tablet (5 mg total) by mouth every evening., Disp: 90 tablet, Rfl: 3 .  levothyroxine (SYNTHROID) 25 MCG tablet, Take 1 tablet (25 mcg total) by mouth daily before breakfast., Disp: 90 tablet, Rfl: 3 .  montelukast (SINGULAIR) 10 MG tablet, Take 1 tablet (10 mg total) by mouth at bedtime., Disp: 90 tablet, Rfl: 3  EXAM:  VITALS per patient if applicable:  GENERAL: alert, oriented, appears well and in no acute  distress  HEENT: atraumatic, conjunttiva clear, no obvious abnormalities on inspection of external nose and ears  NECK: normal movements of the head and neck  LUNGS: on inspection no signs of respiratory distress, breathing rate appears normal, no obvious gross SOB, gasping or wheezing  CV: no obvious cyanosis  MS: moves all visible extremities without noticeable abnormality  PSYCH/NEURO: pleasant and cooperative, no obvious depression or anxiety, speech and thought processing grossly intact  ASSESSMENT AND PLAN:  Discussed the following assessment and plan:  Sinus congestion  Cough  Asthma with acute exacerbation, unspecified asthma severity, unspecified whether persistent  -we discussed possible serious and likely etiologies, options for evaluation and workup, limitations of telemedicine visit vs in person visit, treatment, treatment risks and precautions. Pt prefers to treat via telemedicine empirically rather than in person at this moment.  Given the duration of sinus issues, now worsening with sinus discomfort, query possible sinusitis.  She reports the only medication she can take for this is azithromycin, prescription sent for 500 the first day and 250 for the next 4 days.  Possible secondary flare in her asthma.  It seems she does not take her maintenance medication.  She also reports she has been told by her eye doctor that she cannot take prednisone.  She reports she does not have albuterol.  Discussed the difference between a maintenance inhaler and a rescue inhaler.  Sent a prescription for Tessalon and also for albuterol inhaler to use as needed.  Recommended staying home while sick.  Discussed options for COVID-19 testing and advised that she follow-up with her primary care doctor  if she should have a positive test.   Work/School slipped offered: provided in patient instructions   Scheduled follow up with PCP offered: Advise later this week to follow-up declined - Sent  message to schedulers to assist and advised patient to contact PCP office to schedule if does not receive call back in next 24 hours. Advised to seek prompt follow up telemedicine visit or in person care if worsening, new symptoms arise, or if is not improving with treatment. Did let this patient know that I only do telemedicine on Tuesdays and Thursdays for [place]. Advised to schedule follow up visit with PCP or [facility] if any further questions or concerns to avoid delays in care.   I discussed the assessment and treatment plan with the patient. The patient was provided an opportunity to ask questions and all were answered. The patient agreed with the plan and demonstrated an understanding of the instructions.     Lucretia Kern, DO

## 2019-12-12 ENCOUNTER — Ambulatory Visit (INDEPENDENT_AMBULATORY_CARE_PROVIDER_SITE_OTHER): Payer: Self-pay | Admitting: Family Medicine

## 2019-12-12 ENCOUNTER — Encounter: Payer: Self-pay | Admitting: Family Medicine

## 2019-12-12 ENCOUNTER — Ambulatory Visit (HOSPITAL_BASED_OUTPATIENT_CLINIC_OR_DEPARTMENT_OTHER)
Admission: RE | Admit: 2019-12-12 | Discharge: 2019-12-12 | Disposition: A | Discharge status: Home / Self Care | Payer: Self-pay | Source: Ambulatory Visit | Attending: Family Medicine | Admitting: Family Medicine

## 2019-12-12 ENCOUNTER — Other Ambulatory Visit: Payer: Self-pay

## 2019-12-12 VITALS — BP 108/74 | HR 97 | Temp 98.3°F | Ht 62.0 in | Wt 222.0 lb

## 2019-12-12 DIAGNOSIS — E039 Hypothyroidism, unspecified: Secondary | ICD-10-CM

## 2019-12-12 DIAGNOSIS — R5383 Other fatigue: Secondary | ICD-10-CM

## 2019-12-12 DIAGNOSIS — F418 Other specified anxiety disorders: Secondary | ICD-10-CM

## 2019-12-12 DIAGNOSIS — R0602 Shortness of breath: Secondary | ICD-10-CM

## 2019-12-12 DIAGNOSIS — R002 Palpitations: Secondary | ICD-10-CM | POA: Insufficient documentation

## 2019-12-12 DIAGNOSIS — R400 Somnolence: Secondary | ICD-10-CM

## 2019-12-12 DIAGNOSIS — E669 Obesity, unspecified: Secondary | ICD-10-CM

## 2019-12-12 LAB — HEMOGLOBIN A1C: Hgb A1c MFr Bld: 5.3 % (ref 4.6–6.5)

## 2019-12-12 LAB — CBC WITH DIFFERENTIAL/PLATELET
Basophils Absolute: 0.1 10*3/uL (ref 0.0–0.1)
Basophils Relative: 1 % (ref 0.0–3.0)
Eosinophils Absolute: 0.8 10*3/uL — ABNORMAL HIGH (ref 0.0–0.7)
Eosinophils Relative: 8.8 % — ABNORMAL HIGH (ref 0.0–5.0)
HCT: 40.3 % (ref 36.0–46.0)
Hemoglobin: 13.7 g/dL (ref 12.0–15.0)
Lymphocytes Relative: 27.4 % (ref 12.0–46.0)
Lymphs Abs: 2.5 10*3/uL (ref 0.7–4.0)
MCHC: 34 g/dL (ref 30.0–36.0)
MCV: 101.7 fl — ABNORMAL HIGH (ref 78.0–100.0)
Monocytes Absolute: 0.9 10*3/uL (ref 0.1–1.0)
Monocytes Relative: 9.9 % (ref 3.0–12.0)
Neutro Abs: 4.8 10*3/uL (ref 1.4–7.7)
Neutrophils Relative %: 52.9 % (ref 43.0–77.0)
Platelets: 364 10*3/uL (ref 150.0–400.0)
RBC: 3.96 Mil/uL (ref 3.87–5.11)
RDW: 13.2 % (ref 11.5–15.5)
WBC: 9.1 10*3/uL (ref 4.0–10.5)

## 2019-12-12 LAB — T4, FREE: Free T4: 0.74 ng/dL (ref 0.60–1.60)

## 2019-12-12 LAB — BASIC METABOLIC PANEL
BUN: 18 mg/dL (ref 6–23)
CO2: 28 mEq/L (ref 19–32)
Calcium: 9.4 mg/dL (ref 8.4–10.5)
Chloride: 103 mEq/L (ref 96–112)
Creatinine, Ser: 1.23 mg/dL — ABNORMAL HIGH (ref 0.40–1.20)
GFR: 47.93 mL/min — ABNORMAL LOW (ref >60.00)
Glucose, Bld: 81 mg/dL (ref 70–99)
Potassium: 4.4 mEq/L (ref 3.5–5.1)
Sodium: 138 mEq/L (ref 135–145)

## 2019-12-12 LAB — T3, FREE: T3, Free: 3 pg/mL (ref 2.3–4.2)

## 2019-12-12 LAB — VITAMIN D 25 HYDROXY (VIT D DEFICIENCY, FRACTURES): VITD: 31.21 ng/mL (ref 30.00–100.00)

## 2019-12-12 LAB — TSH: TSH: 2.88 u[IU]/mL (ref 0.35–4.50)

## 2019-12-12 LAB — VITAMIN B12: Vitamin B-12: 553 pg/mL (ref 211–911)

## 2019-12-12 MED ORDER — FLUTICASONE-SALMETEROL 250-50 MCG/DOSE IN AEPB: 1.0000 | INHALATION_SPRAY | Freq: Two times a day (BID) | RESPIRATORY_TRACT | 5 refills | Status: DC

## 2019-12-12 MED ORDER — DULOXETINE HCL 30 MG PO CPEP: 30.0000 mg | ORAL_CAPSULE | Freq: Every day | ORAL | 2 refills | Status: DC

## 2019-12-12 NOTE — Patient Instructions (Addendum)
advair- I increased your advair dose. New inhaler will provide you more coverage.  We will call you with lab results and chest xray results. Please have chest xray today at [facility] [address].  Start Cymbalta one tab daily.   Next appt 4 weeks.    Look into [employer].   If you are age 60 or older, your body mass index should be between 23-30. Your Body mass index is Body mass index is 40.6 kg/m.Marland Kitchen If this is above the aforementioned range listed, you are consider obese by medical definition and standards. Routine daily exercise and dietary modifications are encouraged. If you would like additional guidance on weight loss, please make an appointment for weight loss counseling - must be an appointment dedicate to weight loss counseling alone. I would be happy to help you.   If you are age 45 or younger, your body mass index should be between 19-25. Your Body mass index is Body mass index is 40.6 kg/m. Marland Kitchen If this is above the aformentioned range listed, you are consider obese by medical definition and standards. Routine daily exercise and dietary modifications are encouraged. If you would like additional guidance on weight loss, please make an appointment for weight loss counseling - must be an appointment dedicate to weight loss counseling alone. I would be happy to help you.

## 2019-12-12 NOTE — Progress Notes (Signed)
This visit occurred during the SARS-CoV-2 public health emergency.  Safety protocols were in place, including screening questions prior to the visit, additional usage of staff PPE, and extensive cleaning of exam room while observing appropriate contact time as indicated for disinfecting solutions.    Cathy Baxter , November 28, 1960, 59 y.o., female MRN: [id number] Patient Care Team    Relationship Specialty Notifications Start End  Ma Hillock, DO PCP - General Family Medicine  08/30/16   Philemon Kingdom, MD Consulting Physician Internal Medicine  02/20/16    Comment: endocrine  Linda Hedges, DO Consulting Physician Obstetrics and Gynecology  02/20/16   Mcarthur Rossetti, MD Consulting Physician Orthopedic Surgery  07/11/18     Chief Complaint  Patient presents with  . Hypersomnia    pt c/o excessive sleep x 1 year and palpiations x 2 mos     Subjective: Pt presents for an OV with complaints of fatigue.  Patient reports she has been excessively sleepy over the last year.  Over the last 2-3 months she has been sleeping 18 of 24 hours a day.  She becomes fatigued very easily and reports shortness of breath with activity.  She has gained weight over this time.  She reports her diet has been unchanged and she does not eat much daily.  She has gained 10 pounds over the last few months.  She has been supplementing with B12, vitamin D and a woman's vitamin.  She reports palpitations have been worsening over the last 2 months happen multiple times a day and last for couple seconds at a time.  She denies caffeine use.  She states she also had a sinus infection and was treated with azithromycin a few months ago and had extreme palpitations with use.  She has a history of asthma, and reports she does take her Advair as directed.  She does not have insurance at this time but uses good Rx for her prescriptions. She reports she has not been taking her Xyzal or Singulair.  Depression screen [facility] 2/9  12/12/2019 07/25/2018 06/01/2017 02/20/2016  Decreased Interest 3 0 0 0  Down, Depressed, Hopeless 3 0 0 0  PHQ - 2 Score 6 0 0 0  Altered sleeping 3 - - -  Tired, decreased energy 3 - - -  Change in appetite 3 - - -  Feeling bad or failure about yourself  3 - - -  Trouble concentrating 3 - - -  Moving slowly or fidgety/restless 0 - - -  Suicidal thoughts 0 - - -  PHQ-9 Score 21 - - -   GAD 7 : Generalized Anxiety Score 12/12/2019  Nervous, Anxious, on Edge 3  Control/stop worrying 3  Worry too much - different things 3  Trouble relaxing 3  Restless 0  Easily annoyed or irritable 3  Afraid - awful might happen 3  Total GAD 7 Score 18    Allergies  Allergen Reactions  . Azithromycin Palpitations    Arrhythmia side effects  . Percocet [Oxycodone-Acetaminophen] Shortness Of Breath  . Amoxicillin Hives    Has patient had a PCN reaction causing immediate rash, facial/tongue/throat swelling, SOB or lightheadedness with hypotension: Yes Has patient had a PCN reaction causing severe rash involving mucus membranes or skin necrosis: No Has patient had a PCN reaction that required hospitalization No Has patient had a PCN reaction occurring within the last 10 years: Yes  If all of the above answers are "NO", then may proceed with Cephalosporin  use.   . Doxycycline Itching  . Oxycodone   . 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   . Demerol [Meperidine] Palpitations   Social History   Social History Narrative   Divorced. She has had 3 children, one passed away young from cancer.    11th grade education. Works at [employer].    Former smoker.   Drinks caffeine.    Wears seatbelt, smoke detector in the home.    Firearms in the home.    Feels safe in her relationships.    Past Medical History:  Diagnosis Date  . Allergy   . Anemia   . Arthritis   . Asthma   . Chicken pox   . GERD (gastroesophageal reflux disease)   . Hiatal hernia   . Pigmentary glaucoma of both eyes   . Polyp  of stomach   . Shingles   . Thyroid disease   . Urinary incontinence   . Uterine fibroid   . Uterine fibroids in pregnancy, postpartum condition 11/06/2015   Past Surgical History:  Procedure Laterality Date  . CHOLECYSTECTOMY  2002  . EYE SURGERY    . KNEE ARTHROSCOPY Left 08/2015  . NASAL SINUS SURGERY  2000   Family History  Problem Relation Age of Onset  . Heart disease Mother   . Early death Father   . Cancer Son 10       Neuroepithelioma   . Breast cancer Sister 35   Allergies as of 12/12/2019      Reactions   Percocet [oxycodone-acetaminophen] Shortness Of Breath   Amoxicillin Hives   Has patient had a PCN reaction causing immediate rash, facial/tongue/throat swelling, SOB or lightheadedness with hypotension: Yes Has patient had a PCN reaction causing severe rash involving mucus membranes or skin necrosis: No Has patient had a PCN reaction that required hospitalization No Has patient had a PCN reaction occurring within the last 10 years: Yes  If all of the above answers are "NO", then may proceed with Cephalosporin use.   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    Demerol [meperidine] Palpitations      Medication List       Accurate as of December 12, 2019 11:59 PM. If you have any questions, ask your nurse or doctor.        STOP taking these medications   azithromycin 250 MG tablet Commonly known as: ZITHROMAX Stopped by: Howard Pouch, DO   benzonatate 100 MG capsule Commonly known as: Best boy Stopped by: Howard Pouch, DO   famotidine 40 MG tablet Commonly known as: PEPCID Stopped by: Howard Pouch, DO   Fluticasone-Salmeterol 100-50 MCG/DOSE Aepb Commonly known as: ADVAIR Replaced by: Fluticasone-Salmeterol 250-50 MCG/DOSE Aepb Stopped by: Howard Pouch, DO   ibuprofen 600 MG tablet Commonly known as: ADVIL Stopped by: Howard Pouch, DO   levocetirizine 5 MG tablet Commonly known as: Xyzal Stopped by:  Howard Pouch, DO   montelukast 10 MG tablet Commonly known as: SINGULAIR Stopped by: Howard Pouch, DO     TAKE these medications   albuterol 108 (90 Base) MCG/ACT inhaler Commonly known as: ProAir HFA Inhale 2 puffs into the lungs every 6 (six) hours as needed for wheezing or shortness of breath.   brimonidine 0.2 % ophthalmic solution Commonly known as: ALPHAGAN   cyanocobalamin 1000 MCG tablet Take 1,000 mcg by mouth daily.   Diclofenac Sodium CR 100 MG 24 hr tablet Take 1 tablet (100  mg total) by mouth daily.   dorzolamide-timolol 22.3-6.8 MG/ML ophthalmic solution Commonly known as: COSOPT Place 1 drop into both eyes 2 (two) times daily.   DULoxetine 30 MG capsule Commonly known as: Cymbalta Take 1 capsule (30 mg total) by mouth daily. Started by: Howard Pouch, DO   Fluticasone-Salmeterol 250-50 MCG/DOSE Aepb Commonly known as: Advair Diskus Inhale 1 puff into the lungs 2 (two) times daily. Replaces: Fluticasone-Salmeterol 100-50 MCG/DOSE Aepb Started by: Howard Pouch, DO   levothyroxine 25 MCG tablet Commonly known as: SYNTHROID Take 1 tablet (25 mcg total) by mouth daily before breakfast.   Vitamin D3 10 MCG (400 UNIT) Caps Take by mouth.       All past medical history, surgical history, allergies, family history, immunizations andmedications were updated in the EMR today and reviewed under the history and medication portions of their EMR.     ROS: Negative, with the exception of above mentioned in HPI  Objective:  BP 108/74   Pulse 97   Temp 98.3 F (36.8 C) (Oral)   Ht 5\' 2"  (1.575 m)   Wt 222 lb (100.7 kg)   SpO2 94%   BMI 40.60 kg/m  Body mass index is 40.6 kg/m. Gen: Afebrile. No acute distress. Nontoxic in appearance, well developed, well nourished.  HENT: AT. [place]. Bilateral TM visualized without erythema or bulging.  Normal EAC bilaterally.. MMM, no oral lesions. Bilateral nares without erythema or drainage. Throat without erythema or  exudates.  No cough.  Mild hoarseness present.  No obvious shortness of breath. Eyes:Pupils Equal Round Reactive to light, Extraocular movements intact,  Conjunctiva without redness, discharge or icterus. Neck/lymp/endocrine: Supple, no lymphadenopathy, no thyromegaly CV: RRR no murmur, no edema Chest: CTAB, no wheeze or crackles.  Mild rhonchi present.  Good air movement, normal resp effort.  Abd: Soft.  Obese. NTND. BS present.  No masses palpated. No rebound or guarding.  Skin: no rashes, purpura or petechiae.  Neuro:  Normal gait. PERLA. EOMi. Alert. Oriented x3 Psych: Normal affect, dress and demeanor. Normal speech. Normal thought content and judgment.  KNL:ZJQBHALPFXT ST and T waves changes, occasional PVC noted, unifocal, sinus rhythm-occasional ectopic ventricular beat. HR 75, PR 174, QTc 396.   No exam data present No results found. No results found for this or any previous visit (from the past 24 hour(s)).  Assessment/Plan: Sanjna Haskew is a 60 y.o. 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  We will recheck labs today to ensure supplement does not need to be increased. - T3, free - T4, free - TSH  Uncontrolled daytime somnolence/Palpitations/shortness of breath Lengthy discussion today surrounding possible cardiac or pulmonary causes of the above symptoms.  She had ectopic beats on EKG and some nonspecific ST changes otherwise sinus rhythm.  With her reports of worsening palpitations and dyspnea on exertion will likely refer to cardiology once laboratory results received normal and does not point to other causes. Her asthma does not seem to be well controlled either.  Increase Advair dosage today.  And referral to pulmonology for further eval. I believe she would benefit from a sleep apnea evaluation as well due to her reported symptoms, age and BMI.  She is agreeable to this today. - T3, free - T4,  free - TSH - CBC w/Diff - Iron, TIBC and Ferritin Panel - Hemoglobin K2I - Basic Metabolic Panel (BMET) - EKG 12-Lead - DG Chest 2 View; Future  fatigue Possibly multifactorial -patient  has cardiac, pulmonary, endocrine and psychological symptoms.  Will rule out thyroid replacement needs adjusted, vitamin deficiencies, anemia, iron deficiency, diabetes, kidney dysfunction or electrolyte disorders contributing to her fatigue. - T3, free - T4, free - TSH - B12 - Vitamin D (25 hydroxy) - CBC w/Diff - Iron, TIBC and Ferritin Panel - Hemoglobin V4B - Basic Metabolic Panel (BMET)  Depression with anxiety Lengthy discussion today surrounding her positive PHQ/gad and her reported symptoms of increased fatigue.  We discussed medication management and she is agreeable to this approach today. Start Cymbalta 30 mg daily-printed for her so she could use good Rx.  Follow-up 4 weeks   Reviewed expectations re: course of current medical issues.  Discussed self-management of symptoms.  Outlined signs and symptoms indicating need for more acute intervention.  Patient verbalized understanding and all questions were answered.  Patient received an After-Visit Summary.    Orders Placed This Encounter  Procedures  . DG Chest 2 View  . T3, free  . T4, free  . TSH  . B12  . Vitamin D (25 hydroxy)  . CBC w/Diff  . Iron, TIBC and Ferritin Panel  . Hemoglobin A1c  . Basic Metabolic Panel (BMET)  . EKG 12-Lead   Meds ordered this encounter  Medications  . Fluticasone-Salmeterol (ADVAIR DISKUS) 250-50 MCG/DOSE AEPB    Sig: Inhale 1 puff into the lungs 2 (two) times daily.    Dispense:  1 each    Refill:  5  . DULoxetine (CYMBALTA) 30 MG capsule    Sig: Take 1 capsule (30 mg total) by mouth daily.    Dispense:  30 capsule    Refill:  2   Referrals to pulmonology cardiology placed.  Note is dictated utilizing voice recognition software. Although note has been proof read prior to  signing, occasional typographical errors still can be missed. If any questions arise, please do not hesitate to call for verification.   electronically signed by:  Howard Pouch, DO  [place]

## 2019-12-13 ENCOUNTER — Telehealth: Payer: Self-pay | Admitting: Family Medicine

## 2019-12-13 ENCOUNTER — Encounter: Payer: Self-pay | Admitting: Family Medicine

## 2019-12-13 DIAGNOSIS — R0602 Shortness of breath: Secondary | ICD-10-CM | POA: Insufficient documentation

## 2019-12-13 DIAGNOSIS — F418 Other specified anxiety disorders: Secondary | ICD-10-CM | POA: Insufficient documentation

## 2019-12-13 DIAGNOSIS — R002 Palpitations: Secondary | ICD-10-CM | POA: Insufficient documentation

## 2019-12-13 LAB — IRON,TIBC AND FERRITIN PANEL
%SAT: 32 % (calc) (ref 16–45)
Ferritin: 191 ng/mL (ref 16–232)
Iron: 124 ug/dL (ref 45–160)
TIBC: 385 mcg/dL (calc) (ref 250–450)

## 2019-12-13 MED ORDER — FEXOFENADINE HCL 180 MG PO TABS: 180.0000 mg | ORAL_TABLET | Freq: Every day | ORAL | 3 refills | Status: DC

## 2019-12-13 NOTE — Telephone Encounter (Signed)
Spoke with pt regarding labs and instructions.   

## 2019-12-13 NOTE — Telephone Encounter (Signed)
LVM for pt to CB regarding results.  

## 2019-12-13 NOTE — Telephone Encounter (Signed)
Please inform patient the following information: Her chest x-ray was read as normal. -Vitamin D levels are in low normal range at 31.    -I would recommend she add 1000  units of  vitamin D daily to her current supplement regimen. -Her B12 levels are in normal range at 553.  This is mid range she could use a mild increase in her B12 to help with energy.   - I would recommend she start a "B complex  "supplement.  "B complex "supplement has all of  the B vitamins included in 1 pill. -Her blood sugars are good and she is not a diabetic. -Her thyroid levels are normal. -She has a normal blood cell count with high eosinophils-which is the cell responsible for allergies/asthma.    -I would strongly encourage her to start the higher dose Advair daily as we discussed/prescribed, as well as, starting a daily antihistamine such as Allegra.  Allegra can be taken in the day and does not have a side effect of sleepiness.  I have called this in for her.  But she can purchase over-the-counter.  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  Her kidney function has always been normal on prior collections.    -I would recommend she hydrate.  Drink at least 80  ounces of water a day.    -Avoid use of  ibuprofen/Advil, naproxen/Aleve or  Motrin.  Avoid use of any alcohol if consuming.  -Please ask her if she is taking diclofenac  that is prescribed for her muscle  skeletal/arthritic pain on her med list?       -If she is taking that medicine please tell   her to discontinue.  Also, please make sure   provider knows if she was or was not taking   the medication.     Please make sure she has follow-up here in 4 weeks- well hydrated- on her new med start (cymbalta and higher dose advair) and repeat kidney fx labs with provider.  I also have referred her to pulmonology to further evaluate her asthma and evaluate for sleep apnea, as causes to her fatigue.  I also referred her to  cardiology for her palpitations.   Thanks.

## 2020-01-12 DIAGNOSIS — I471 Supraventricular tachycardia, unspecified: Secondary | ICD-10-CM

## 2020-01-12 HISTORY — DX: Supraventricular tachycardia, unspecified: I47.10

## 2020-01-14 ENCOUNTER — Ambulatory Visit: Payer: Self-pay | Admitting: Family Medicine

## 2020-01-14 DIAGNOSIS — Z0289 Encounter for other administrative examinations: Secondary | ICD-10-CM

## 2020-02-25 ENCOUNTER — Other Ambulatory Visit: Payer: Self-pay

## 2020-02-25 ENCOUNTER — Encounter: Payer: Self-pay | Admitting: Family Medicine

## 2020-02-26 ENCOUNTER — Ambulatory Visit (INDEPENDENT_AMBULATORY_CARE_PROVIDER_SITE_OTHER): Payer: 59 | Admitting: Family Medicine

## 2020-02-26 ENCOUNTER — Encounter: Payer: Self-pay | Admitting: Family Medicine

## 2020-02-26 VITALS — BP 118/78 | HR 93 | Temp 98.2°F | Ht 62.0 in | Wt 226.0 lb

## 2020-02-26 DIAGNOSIS — R5383 Other fatigue: Secondary | ICD-10-CM | POA: Diagnosis not present

## 2020-02-26 DIAGNOSIS — R748 Abnormal levels of other serum enzymes: Secondary | ICD-10-CM | POA: Insufficient documentation

## 2020-02-26 DIAGNOSIS — E7849 Other hyperlipidemia: Secondary | ICD-10-CM

## 2020-02-26 DIAGNOSIS — E559 Vitamin D deficiency, unspecified: Secondary | ICD-10-CM | POA: Diagnosis not present

## 2020-02-26 DIAGNOSIS — K219 Gastro-esophageal reflux disease without esophagitis: Secondary | ICD-10-CM | POA: Insufficient documentation

## 2020-02-26 DIAGNOSIS — M19041 Primary osteoarthritis, right hand: Secondary | ICD-10-CM

## 2020-02-26 DIAGNOSIS — Z860100 Personal history of colon polyps, unspecified: Secondary | ICD-10-CM | POA: Insufficient documentation

## 2020-02-26 DIAGNOSIS — M19042 Primary osteoarthritis, left hand: Secondary | ICD-10-CM

## 2020-02-26 DIAGNOSIS — R7401 Elevation of levels of liver transaminase levels: Secondary | ICD-10-CM | POA: Insufficient documentation

## 2020-02-26 DIAGNOSIS — E039 Hypothyroidism, unspecified: Secondary | ICD-10-CM

## 2020-02-26 DIAGNOSIS — R002 Palpitations: Secondary | ICD-10-CM

## 2020-02-26 DIAGNOSIS — R400 Somnolence: Secondary | ICD-10-CM

## 2020-02-26 DIAGNOSIS — M23322 Other meniscus derangements, posterior horn of medial meniscus, left knee: Secondary | ICD-10-CM

## 2020-02-26 DIAGNOSIS — R194 Change in bowel habit: Secondary | ICD-10-CM | POA: Insufficient documentation

## 2020-02-26 DIAGNOSIS — R0602 Shortness of breath: Secondary | ICD-10-CM

## 2020-02-26 DIAGNOSIS — R0609 Other forms of dyspnea: Secondary | ICD-10-CM

## 2020-02-26 DIAGNOSIS — M170 Bilateral primary osteoarthritis of knee: Secondary | ICD-10-CM

## 2020-02-26 DIAGNOSIS — M47816 Spondylosis without myelopathy or radiculopathy, lumbar region: Secondary | ICD-10-CM

## 2020-02-26 DIAGNOSIS — F418 Other specified anxiety disorders: Secondary | ICD-10-CM

## 2020-02-26 DIAGNOSIS — J45901 Unspecified asthma with (acute) exacerbation: Secondary | ICD-10-CM

## 2020-02-26 DIAGNOSIS — E669 Obesity, unspecified: Secondary | ICD-10-CM

## 2020-02-26 DIAGNOSIS — M5136 Other intervertebral disc degeneration, lumbar region: Secondary | ICD-10-CM

## 2020-02-26 DIAGNOSIS — R0600 Dyspnea, unspecified: Secondary | ICD-10-CM

## 2020-02-26 DIAGNOSIS — Z8249 Family history of ischemic heart disease and other diseases of the circulatory system: Secondary | ICD-10-CM

## 2020-02-26 DIAGNOSIS — N289 Disorder of kidney and ureter, unspecified: Secondary | ICD-10-CM

## 2020-02-26 DIAGNOSIS — R197 Diarrhea, unspecified: Secondary | ICD-10-CM | POA: Insufficient documentation

## 2020-02-26 MED ORDER — LEVOTHYROXINE SODIUM 25 MCG PO TABS
25.0000 ug | ORAL_TABLET | Freq: Every day | ORAL | 3 refills | Status: DC
Start: 2020-02-26 — End: 2021-03-05

## 2020-02-26 MED ORDER — FLUTICASONE-SALMETEROL 250-50 MCG/DOSE IN AEPB
1.0000 | INHALATION_SPRAY | Freq: Two times a day (BID) | RESPIRATORY_TRACT | 5 refills | Status: DC
Start: 2020-02-26 — End: 2020-05-08

## 2020-02-26 MED ORDER — FEXOFENADINE HCL 180 MG PO TABS
180.0000 mg | ORAL_TABLET | Freq: Every day | ORAL | 3 refills | Status: DC
Start: 2020-02-26 — End: 2020-05-08

## 2020-02-26 MED ORDER — DULOXETINE HCL 30 MG PO CPEP
30.0000 mg | ORAL_CAPSULE | Freq: Every day | ORAL | 1 refills | Status: DC
Start: 2020-02-26 — End: 2020-11-26

## 2020-02-26 NOTE — Progress Notes (Unsigned)
This visit occurred during the SARS-CoV-2 public health emergency.  Safety protocols were in place, including screening questions prior to the visit, additional usage of staff PPE, and extensive cleaning of exam room while observing appropriate contact time as indicated for disinfecting solutions.    Cathy Baxter , 1960-12-19, 60 y.o., female MRN: [id number] Patient Care Team    Relationship Specialty Notifications Start End  Ma Hillock, DO PCP - General Family Medicine  08/30/16   Philemon Kingdom, MD Consulting Physician Internal Medicine  02/20/16    Comment: endocrine  Linda Hedges, [place] Physician Obstetrics and Gynecology  02/20/16   Mcarthur Rossetti, MD Consulting Physician Orthopedic Surgery  07/11/18   Wylene Simmer, MD Consulting Physician Orthopedic Surgery  02/27/20   Paralee Cancel, MD Consulting Physician Orthopedic Surgery  02/27/20     Chief Complaint  Patient presents with  . Follow-up    CMC; pt is not fasting     Subjective: Pt presents for an OV to follow up on  [facility] Palpitations/fatigue/depression anxiety: She reports her palpitations have improved.  She is still fatigued at times and admits she still has some shortness of breath with activity.  She does not feel as depressed as she did when she was last seen but she is still having depression symptoms and anxiety.  She has not started the Cymbalta secondary to insurance.  States she has a new insurance now and she is ready to start. Prior note:  Pt presents for an OV with complaints of fatigue.  Patient reports she has been excessively sleepy over the last year.  Over the last 2-3 months she has been sleeping 18 of 24 hours a day.  She becomes fatigued very easily and reports shortness of breath with activity.  She has gained weight over this time.  She reports her diet has been unchanged and she does not eat much daily.  She has gained 10 pounds over the last few months.  She has been supplementing  with B12, vitamin D and a woman's vitamin.  She reports palpitations have been worsening over the last 2 months happen multiple times a day and last for couple seconds at a time.  She denies caffeine use.  She states she also had a sinus infection and was treated with azithromycin a few months ago and had extreme palpitations with use.  She has a history of asthma, and reports she does take her Advair as directed.  She does not have insurance at this time but uses [facility] for her prescriptions. She reports she has not been taking her Xyzal or Singulair.  Hypothyroidism, unspecified type/obesity Pt reports compliance of levothyroxine 25 mcg. She reports persistent increased fatigue and weight gain.   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  She states she still feels more short of breath with activity than in the past.  Primary osteoarthritis of both knees/Primary osteoarthritis of both hands/DDD (degenerative disc disease), lumbar She has orthopedic and podiatry appointments coming up in 2 weeks to address these issues.  She is not taking the NSAIDs at this time secondary to decreased kidney function.  She has yet to start the Cymbalta secondary to insurance coverage.  She now has a new insurance and would like a new prescription for the Cymbalta.  Depression screen [facility] 2/9 12/12/2019 07/25/2018 06/01/2017 02/20/2016  Decreased Interest 3 0 0  0  Down, Depressed, Hopeless 3 0 0 0  PHQ - 2 Score 6 0 0 0  Altered sleeping 3 - - -  Tired, decreased energy 3 - - -  Change in appetite 3 - - -  Feeling bad or failure about yourself  3 - - -  Trouble concentrating 3 - - -  Moving slowly or fidgety/restless 0 - - -  Suicidal thoughts 0 - - -  PHQ-9 Score 21 - - -    Allergies  Allergen Reactions  . Azithromycin Palpitations    Arrhythmia side effects  . Percocet  [Oxycodone-Acetaminophen] Shortness Of Breath  . Amoxicillin Hives    Has patient had a PCN reaction causing immediate rash, facial/tongue/throat swelling, SOB or lightheadedness with hypotension: Yes Has patient had a PCN reaction causing severe rash involving mucus membranes or skin necrosis: No Has patient had a PCN reaction that required hospitalization No Has patient had a PCN reaction occurring within the last 10 years: Yes  If all of the above answers are "NO", then may proceed with Cephalosporin use.   Marland Kitchen Doxycycline Itching  . Oxycodone   . 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   . Demerol [Meperidine] Palpitations   Social History   Social History Narrative   Divorced. She has had 3 children, one passed away young from cancer.    11th grade education. Works at [employer].    Former smoker.   Drinks caffeine.    Wears seatbelt, smoke detector in the home.    Firearms in the home.    Feels safe in her relationships.    Past Medical History:  Diagnosis Date  . Allergy   . Anemia   . Arthritis   . Asthma   . Chicken pox   . GERD (gastroesophageal reflux disease)   . Hiatal hernia   . Pigmentary glaucoma of both eyes   . Polyp of stomach   . Shingles   . Thyroid disease   . Urinary incontinence   . Uterine fibroid   . Uterine fibroids in pregnancy, postpartum condition 11/06/2015   Past Surgical History:  Procedure Laterality Date  . CHOLECYSTECTOMY  2002  . EYE SURGERY    . KNEE ARTHROSCOPY Left 08/2015  . NASAL SINUS SURGERY  2000   Family History  Problem Relation Age of Onset  . Heart disease Mother   . Early death Father   . Cancer Son 10       Neuroepithelioma   . Breast cancer Sister 13   Allergies as of 02/26/2020      Reactions   Azithromycin Palpitations   Arrhythmia side effects   Percocet [oxycodone-acetaminophen] Shortness Of Breath   Amoxicillin Hives   Has patient had a PCN reaction causing immediate rash, facial/tongue/throat  swelling, SOB or lightheadedness with hypotension: Yes Has patient had a PCN reaction causing severe rash involving mucus membranes or skin necrosis: No Has patient had a PCN reaction that required hospitalization No Has patient had a PCN reaction occurring within the last 10 years: Yes  If all of the above answers are "NO", then may proceed with Cephalosporin use.   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    Demerol [meperidine] Palpitations      Medication List       Accurate as of February 26, 2020 11:59 PM. If you have any questions, ask your nurse or doctor.  STOP taking these medications   cyanocobalamin 1000 MCG tablet Stopped by: Howard Pouch, DO   Diclofenac Sodium CR 100 MG 24 hr tablet Stopped by: Howard Pouch, DO     TAKE these medications   albuterol 108 (90 Base) MCG/ACT inhaler Commonly known as: ProAir HFA Inhale 2 puffs into the lungs every 6 (six) hours as needed for wheezing or shortness of breath.   b complex vitamins capsule Take 1 capsule by mouth daily.   brimonidine 0.2 % ophthalmic solution Commonly known as: ALPHAGAN   dorzolamide-timolol 22.3-6.8 MG/ML ophthalmic solution Commonly known as: COSOPT Place 1 drop into both eyes 2 (two) times daily.   DULoxetine 30 MG capsule Commonly known as: Cymbalta Take 1 capsule (30 mg total) by mouth daily.   fexofenadine 180 MG tablet Commonly known as: ALLEGRA Take 1 tablet (180 mg total) by mouth daily.   Fluticasone-Salmeterol 250-50 MCG/DOSE Aepb Commonly known as: Advair Diskus Inhale 1 puff into the lungs 2 (two) times daily.   levothyroxine 25 MCG tablet Commonly known as: SYNTHROID Take 1 tablet (25 mcg total) by mouth daily before breakfast.   Vitamin D3 75 MCG (3000 UT) Tabs Take by mouth.   Vitamin D3 50 MCG (2000 UT) Tabs Take by mouth.       All past medical history, surgical history, allergies, family history, immunizations  andmedications were updated in the EMR today and reviewed under the history and medication portions of their EMR.     ROS: Negative, with the exception of above mentioned in HPI   Objective:  BP 118/78   Pulse 93   Temp 98.2 F (36.8 C) (Oral)   Ht _0  (1.575 m)   Wt 226 lb (102.5 kg)   SpO2 96%   BMI 41.34 kg/m  Body mass index is 41.34 kg/m. Gen: Afebrile. No acute distress.  Nontoxic in appearance, talkative, obese, pleasant female. HENT: AT. [place].  Cough or hoarseness Eyes:Pupils Equal Round Reactive to light, Extraocular movements intact,  Conjunctiva without redness, discharge or icterus. Neck/lymp/endocrine: Supple, no lymphadenopathy, no thyromegaly CV: RRR no murmur, no edema, +2/4 P posterior tibialis pulses Chest: CTAB, no wheeze or crackles.  Normal respiratory effort.  Good air movement. MSK: Left knee effusion present.  No erythema. Neuro: Normal gait. PERLA. EOMi. Alert. Oriented x3 Psych: Normal affect, dress and demeanor. Normal speech. Normal thought content and judgment.   No exam data present No results found. No results found for this or any previous visit (from the past 24 hour(s)).  Assessment/Plan: Rajvi Armentor is a 60 y.o. female present for OV for  Hypothyroidism, unspecified type/obesity Stable.  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  However still complaining of some mild dyspnea with activity. 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  Consider referral to asthma and allergy if symptoms do not continue to improve  Primary osteoarthritis of both knees/Primary osteoarthritis of both hands/DDD (degenerative disc disease), lumbar/left meniscal horn tear She is uncomfortable today.   Hopefully when she starts the Cymbalta she will have some relief. We pause the use of diclofenac secondary to decrease in kidney function.  Unfortunately steroids are not ideal with her history of significant glaucoma  Function kidney decreased Discontinued chronic NSAIDs and she is hydrating - Comp Met (CMET)  Vitamin D deficiency - Vitamin D (25 hydroxy)  Obesity (BMI 30-39.9)/daytime somnolence/palpitations Referral to [facility] today to be evaluated for sleep apnea. - Ambulatory referral to Neurology  Depression with anxiety She has yet to start the Cymbalta secondary to insurance coverage.  She is ready to start now since she has a new insurance.  She does seem improved today from our last visit but still admits to significant depression and anxiety. Start Cymbalta 30 mg daily Close follow-up will be scheduled in 6-8 weeks  Shortness of breath/Palpitations/Dyspnea on exertion/fatigue Palpitations have improved since her last visit.  However they are still occurring and she still admits to dyspnea on exertion despite great improvement in her asthma control today. Referral to cardiology placed for eval given her mother's history of heart disease.     Reviewed expectations re: course of current medical issues.  Discussed self-management of symptoms.  Outlined signs and symptoms indicating need for more acute intervention.  Patient verbalized understanding and all questions were answered.  Patient received an After-Visit Summary.    Orders Placed This Encounter  Procedures  . Comp Met (CMET)  . Vitamin D (25 hydroxy)  . Ambulatory referral to Neurology  . Ambulatory referral to Cardiology   Meds ordered this encounter  Medications  . DULoxetine (CYMBALTA) 30 MG capsule    Sig: Take 1 capsule (30 mg total) by mouth daily.    Dispense:  90 capsule    Refill:  1  . levothyroxine (SYNTHROID) 25 MCG tablet    Sig: Take 1 tablet (25 mcg total) by  mouth daily before breakfast.    Dispense:  90 tablet    Refill:  3  . fexofenadine (ALLEGRA) 180 MG tablet    Sig: Take 1 tablet (180 mg total) by mouth daily.    Dispense:  90 tablet    Refill:  3  . Fluticasone-Salmeterol (ADVAIR DISKUS) 250-50 MCG/DOSE AEPB    Sig: Inhale 1 puff into the lungs 2 (two) times daily.    Dispense:  1 each    Refill:  5    Referral Orders     Ambulatory referral to Neurology     Ambulatory referral to Cardiology   Note is dictated utilizing voice recognition software. Although note has been proof read prior to signing, occasional typographical errors still can be missed. If any questions arise, please do not hesitate to call for verification.   electronically signed by:  Howard Pouch, DO  [place]

## 2020-02-26 NOTE — Patient Instructions (Addendum)
I have refilled your meds today.  Start cymbalta once daily.   We will call you with lab results from today and guide you on vit d doses.   I have referred you to Neurology  for sleep study now that your insurance has kicked in. They will call you to schedule.

## 2020-02-27 ENCOUNTER — Telehealth: Payer: Self-pay | Admitting: Family Medicine

## 2020-02-27 ENCOUNTER — Encounter: Payer: Self-pay | Admitting: Family Medicine

## 2020-02-27 DIAGNOSIS — R0609 Other forms of dyspnea: Secondary | ICD-10-CM | POA: Insufficient documentation

## 2020-02-27 DIAGNOSIS — R0600 Dyspnea, unspecified: Secondary | ICD-10-CM | POA: Insufficient documentation

## 2020-02-27 LAB — COMPREHENSIVE METABOLIC PANEL
AG Ratio: 1.4 (calc) (ref 1.0–2.5)
ALT: 61 U/L — ABNORMAL HIGH (ref 6–29)
AST: 37 U/L — ABNORMAL HIGH (ref 10–35)
Albumin: 4.2 g/dL (ref 3.6–5.1)
Alkaline phosphatase (APISO): 76 U/L (ref 37–153)
BUN: 17 mg/dL (ref 7–25)
CO2: 23 mmol/L (ref 20–32)
Calcium: 9.5 mg/dL (ref 8.6–10.4)
Chloride: 105 mmol/L (ref 98–110)
Creat: 0.81 mg/dL (ref 0.50–0.99)
Globulin: 3.1 g/dL (calc) (ref 1.9–3.7)
Glucose, Bld: 93 mg/dL (ref 65–99)
Potassium: 4.6 mmol/L (ref 3.5–5.3)
Sodium: 140 mmol/L (ref 135–146)
Total Bilirubin: 0.4 mg/dL (ref 0.2–1.2)
Total Protein: 7.3 g/dL (ref 6.1–8.1)

## 2020-02-27 LAB — VITAMIN D 25 HYDROXY (VIT D DEFICIENCY, FRACTURES): Vit D, 25-Hydroxy: 35 ng/mL (ref 30–100)

## 2020-02-27 NOTE — Telephone Encounter (Signed)
Spoke with pt regarding labs and instructions.   

## 2020-02-27 NOTE — Telephone Encounter (Signed)
Please inform patient Her vitamin D levels are in normal range.  Would encourage her to continue her vitamin D supplement the way she is taking it. Her kidney function is improved from last visit however her liver function is now abnormal.   -On her next follow-up, we will repeat her liver enzymes and start work-up if necessary.  Avoid products with Tylenol and any alcohol.    Please make sure she has follow-up in 5 to 6 weeks with this provider.  Also, remind her I have placed a referral to neurology for sleep apnea eval.  I also went ahead and placed a referral to cardiology to ensure her shortness of breath with activity is not cardiac related.  Thanks

## 2020-02-29 ENCOUNTER — Encounter: Payer: Self-pay | Admitting: Cardiology

## 2020-02-29 ENCOUNTER — Other Ambulatory Visit: Payer: Self-pay

## 2020-02-29 ENCOUNTER — Ambulatory Visit (INDEPENDENT_AMBULATORY_CARE_PROVIDER_SITE_OTHER): Payer: 59

## 2020-02-29 ENCOUNTER — Ambulatory Visit (INDEPENDENT_AMBULATORY_CARE_PROVIDER_SITE_OTHER): Payer: 59 | Admitting: Cardiology

## 2020-02-29 VITALS — BP 120/80 | HR 70 | Ht 62.0 in | Wt 225.0 lb

## 2020-02-29 DIAGNOSIS — R0602 Shortness of breath: Secondary | ICD-10-CM

## 2020-02-29 DIAGNOSIS — E7849 Other hyperlipidemia: Secondary | ICD-10-CM

## 2020-02-29 DIAGNOSIS — R5383 Other fatigue: Secondary | ICD-10-CM

## 2020-02-29 DIAGNOSIS — R002 Palpitations: Secondary | ICD-10-CM

## 2020-02-29 DIAGNOSIS — R400 Somnolence: Secondary | ICD-10-CM | POA: Diagnosis not present

## 2020-02-29 NOTE — Patient Instructions (Signed)
Medication Instructions:  Your physician recommends that you continue on your current medications as directed. Please refer to the Current Medication list given to you today.  *If you need a refill on your cardiac medications before your next appointment, please call your pharmacy*   Lab Work: None If you have labs (blood work) drawn today and your tests are completely normal, you will receive your results only by: Marland Kitchen MyChart Message (if you have MyChart) OR . A paper copy in the mail If you have any lab test that is abnormal or we need to change your treatment, we will call you to review the results.   Testing/Procedures: Your physician has requested that you have an echocardiogram. Echocardiography is a painless test that uses sound waves to create images of your heart. It provides your doctor with information about the size and shape of your heart and how well your heart's chambers and valves are working. This procedure takes approximately one hour. There are no restrictions for this procedure.  A zio monitor was ordered today. It will remain on for 7 days. You will then return monitor and event diary in provided box. It takes 1-2 weeks for report to be downloaded and returned to [place]. We will call you with the results. If monitor falls off or has orange flashing light, please call Zio for further instructions.   Your physician has recommended that you have a sleep study. This test records several body functions during sleep, including: brain activity, eye movement, oxygen and carbon dioxide blood levels, heart rate and rhythm, breathing rate and rhythm, the flow of air through your mouth and nose, snoring, body muscle movements, and chest and belly movement.    Follow-Up: At [facility], you and your health needs are our priority.  As part of our continuing mission to provide you with exceptional heart care, we have created designated Provider Care Teams.  These Care Teams include your  primary Cardiologist (physician) and Advanced Practice Providers (APPs -  Physician Assistants and Nurse Practitioners) who all work together to provide you with the care you need, when you need it.  We recommend signing up for the patient portal called "MyChart".  Sign up information is provided on this After Visit Summary.  MyChart is used to connect with patients for Virtual Visits (Telemedicine).  Patients are able to view lab/test results, encounter notes, upcoming appointments, etc.  Non-urgent messages can be sent to your provider as well.   To learn more about what you can do with MyChart, go to [email or web address].    Your next appointment:   3 month(s)  The format for your next appointment:   In Person  Provider:   Berniece Salines, DO   Other Instructions

## 2020-02-29 NOTE — Progress Notes (Signed)
Cardiology Office Note:    Date:  02/29/2020   ID:  Cathy Baxter, DOB 31-Jan-1960, MRN [id number]  PCP:  Cathy Hillock, DO  Cardiologist:  No primary care provider on file.  Electrophysiologist:  None   Referring MD: Cathy Hillock, DO   Chief Complaint  Patient presents with  . Palpitations  . Shortness of Breath    History of Present Illness:    Cathy Baxter is a 60 y.o. female with a hx of hypothyroidism, hiatal hernia, obesity here today to be evaluated for palpitation shortness of breath and fatigue.  The patient tells me that over the last several months she has had worsening palpitations which usually started when she was exert herself she will get really fast heartbeat and once this happened she got significantly short of breath.  She would have to sit down to feel better.  The shortness of breath is going on so is the palpitation however the frequency is not more.  In addition she reports that recently she has gotten very tired during the daytime and just feels like she can sit anywhere and fall asleep.  The sleepiness is gotten more bothersome and she is concerned about this.  She is concerned with her palpitations because she knows that her mother has atrial fibrillation and feels the same way as well.  Past Medical History:  Diagnosis Date  . Allergy   . Anemia   . Arthritis   . Asthma   . Chicken pox   . GERD (gastroesophageal reflux disease)   . Hiatal hernia   . Pigmentary glaucoma of both eyes   . Polyp of stomach   . Shingles   . Thyroid disease   . Urinary incontinence   . Uterine fibroid   . Uterine fibroids in pregnancy, postpartum condition 11/06/2015    Past Surgical History:  Procedure Laterality Date  . CHOLECYSTECTOMY  2002  . EYE SURGERY    . KNEE ARTHROSCOPY Left 08/2015  . NASAL SINUS SURGERY  2000    Current Medications: Current Meds  Medication Sig  . albuterol (PROAIR HFA) 108 (90 Base) MCG/ACT inhaler Inhale 2 puffs into the  lungs every 6 (six) hours as needed for wheezing or shortness of breath.  Marland Kitchen b complex vitamins capsule Take 1 capsule by mouth daily.  . brimonidine (ALPHAGAN) 0.2 % ophthalmic solution Place 1 drop into both eyes 2 (two) times daily.  . Cholecalciferol (VITAMIN D3) 50 MCG (2000 UT) TABS Take 1 tablet by mouth daily.  . Cholecalciferol (VITAMIN D3) 75 MCG (3000 UT) TABS Take by mouth.  . dorzolamide-timolol (COSOPT) 22.3-6.8 MG/ML ophthalmic solution Place 1 drop into both eyes 2 (two) times daily.   . DULoxetine (CYMBALTA) 30 MG capsule Take 1 capsule (30 mg total) by mouth daily.  . fexofenadine (ALLEGRA) 180 MG tablet Take 1 tablet (180 mg total) by mouth daily.  . Fluticasone-Salmeterol (ADVAIR DISKUS) 250-50 MCG/DOSE AEPB Inhale 1 puff into the lungs 2 (two) times daily.  Marland Kitchen levothyroxine (SYNTHROID) 25 MCG tablet Take 1 tablet (25 mcg total) by mouth daily before breakfast.     Allergies:   Azithromycin, Percocet [oxycodone-acetaminophen], Amoxicillin, Doxycycline, Oxycodone, Prednisone, and Demerol [meperidine]   Social History   Socioeconomic History  . Marital status: Divorced    Spouse name: Not on file  . Number of children: Not on file  . Years of education: 80  . Highest education level: Not on file  Occupational History  . Occupation: stock  Tobacco  Use  . Smoking status: Former Smoker    Packs/day: 3.00    Years: 12.00    Pack years: 36.00    Types: Cigarettes    Quit date: 07/12/1983    Years since quitting: 36.6  . Smokeless tobacco: Never Used  Vaping Use  . Vaping Use: Never used  Substance and Sexual Activity  . Alcohol use: Yes    Comment: social   . Drug use: No  . Sexual activity: Not on file  Other Topics Concern  . Not on file  Social History Narrative   Divorced. She has had 3 children, one passed away young from cancer.    11th grade education. Works at [employer].    Former smoker.   Drinks caffeine.    Wears seatbelt, smoke detector in the home.     Firearms in the home.    Feels safe in her relationships.    Social Determinants of Health   Financial Resource Strain: Not on file  Food Insecurity: Not on file  Transportation Needs: Not on file  Physical Activity: Not on file  Stress: Not on file  Social Connections: Not on file     Family History: The patient's family history includes Breast cancer (age of onset: 49) in her sister; Cancer (age of onset: 81) in her son; Early death in her father; Heart disease in her mother.  ROS:   Review of Systems  Constitution: Reports generalized fatigue and sleepiness daytime somnolence.  Negative for decreased appetite, fever and weight gain.  HENT: Negative for congestion, ear discharge, hoarse voice and sore throat.   Eyes: Negative for discharge, redness, vision loss in right eye and visual halos.  Cardiovascular: Reports palpitation and dyspnea on exertion.  Negative for chest pain, leg swelling, orthopnea and .  Respiratory: Negative for cough, hemoptysis, shortness of breath and snoring.   Endocrine: Negative for heat intolerance and polyphagia.  Hematologic/Lymphatic: Negative for bleeding problem. Does not bruise/bleed easily.  Skin: Negative for flushing, nail changes, rash and suspicious lesions.  Musculoskeletal: Negative for arthritis, joint pain, muscle cramps, myalgias, neck pain and stiffness.  Gastrointestinal: Negative for abdominal pain, bowel incontinence, diarrhea and excessive appetite.  Genitourinary: Negative for decreased libido, genital sores and incomplete emptying.  Neurological: Negative for brief paralysis, focal weakness, headaches and loss of balance.  Psychiatric/Behavioral: Negative for altered mental status, depression and suicidal ideas.  Allergic/Immunologic: Negative for HIV exposure and persistent infections.    EKGs/Labs/Other Studies Reviewed:    The following studies were reviewed today:   EKG:  The ekg ordered today demonstrates sinus  rhythm, heart rate 70 bpm   Recent Labs: 12/12/2019: Hemoglobin 13.7; Platelets 364.0; TSH 2.88 02/26/2020: ALT 61; BUN 17; Creat 0.81; Potassium 4.6; Sodium 140  Recent Lipid Panel No results found for: CHOL, TRIG, HDL, CHOLHDL, VLDL, LDLCALC, LDLDIRECT  Physical Exam:    VS:  BP 120/80 (BP Location: Left Arm, Patient Position: Sitting, Cuff Size: Large)   Pulse 70   Ht 5\' 2"  (1.575 m)   Wt 225 lb (102.1 kg)   SpO2 96%   BMI 41.15 kg/m     Wt Readings from Last 3 Encounters:  02/29/20 225 lb (102.1 kg)  02/26/20 226 lb (102.5 kg)  12/12/19 222 lb (100.7 kg)     GEN: Well nourished, well developed in no acute distress HEENT: Normal NECK: No JVD; No carotid bruits LYMPHATICS: No lymphadenopathy CARDIAC: S1S2 noted,RRR, no murmurs, rubs, gallops RESPIRATORY:  Clear to auscultation without rales, wheezing  or rhonchi  ABDOMEN: Soft, non-tender, non-distended, +bowel sounds, no guarding. EXTREMITIES: No edema, No cyanosis, no clubbing MUSCULOSKELETAL:  No deformity  SKIN: Warm and dry NEUROLOGIC:  Alert and oriented x 3, non-focal PSYCHIATRIC:  Normal affect, good insight  ASSESSMENT:    1. Daytime somnolence   2. Palpitations   3. Shortness of breath   4. Other hyperlipidemia   5. Uncontrolled daytime somnolence   6. Other fatigue   7. Morbid obesity ([place])    PLAN:     Her symptoms are concerning giving her palpitations with the associated shortness of breath.  We will like to do is rule out atrial fibrillation as a culprit here therefore a zio monitor will be placed on the patient for 7 days.  In addition I like to rule out any valvular abnormalities and any LV dysfunction with her shortness of breath or an echocardiogram will be appropriate.  I am suspecting her daytime somnolence and fatigue may be due to undiagnosed sleep apnea.  Can have the patient go for sleep study.  Which will also likely support the fact that atrial fibrillation may also be playing a role.  Her  most recent lipid profile was in November 2019 which showed total cholesterol 252, HDL 63, LDL 68 and triglycerides 97.  For now I am not going to start the patient on any statin medication as she also does have transaminitis on her most recent lab.  What I like to do is repeat her lipid profile.  She is following up with her PCP for her transaminitis.  The patient understands the need to lose weight with diet and exercise. We have discussed specific strategies for this.   The patient is in agreement with the above plan. The patient left the office in stable condition.  The patient will follow up in   Medication Adjustments/Labs and Tests Ordered: Current medicines are reviewed at length with the patient today.  Concerns regarding medicines are outlined above.  Orders Placed This Encounter  Procedures  . Lipid panel  . LONG TERM MONITOR (3-14 DAYS)  . EKG 12-Lead  . ECHOCARDIOGRAM COMPLETE  . Split night study   No orders of the defined types were placed in this encounter.   Patient Instructions  Medication Instructions:  Your physician recommends that you continue on your current medications as directed. Please refer to the Current Medication list given to you today.  *If you need a refill on your cardiac medications before your next appointment, please call your pharmacy*   Lab Work: None If you have labs (blood work) drawn today and your tests are completely normal, you will receive your results only by: Marland Kitchen MyChart Message (if you have MyChart) OR . A paper copy in the mail If you have any lab test that is abnormal or we need to change your treatment, we will call you to review the results.   Testing/Procedures: Your physician has requested that you have an echocardiogram. Echocardiography is a painless test that uses sound waves to create images of your heart. It provides your doctor with information about the size and shape of your heart and how well your heart's chambers and  valves are working. This procedure takes approximately one hour. There are no restrictions for this procedure.  A zio monitor was ordered today. It will remain on for 7 days. You will then return monitor and event diary in provided box. It takes 1-2 weeks for report to be downloaded and returned to [place].  We will call you with the results. If monitor falls off or has orange flashing light, please call Zio for further instructions.   Your physician has recommended that you have a sleep study. This test records several body functions during sleep, including: brain activity, eye movement, oxygen and carbon dioxide blood levels, heart rate and rhythm, breathing rate and rhythm, the flow of air through your mouth and nose, snoring, body muscle movements, and chest and belly movement.    Follow-Up: At [facility], you and your health needs are our priority.  As part of our continuing mission to provide you with exceptional heart care, we have created designated Provider Care Teams.  These Care Teams include your primary Cardiologist (physician) and Advanced Practice Providers (APPs -  Physician Assistants and Nurse Practitioners) who all work together to provide you with the care you need, when you need it.  We recommend signing up for the patient portal called "MyChart".  Sign up information is provided on this After Visit Summary.  MyChart is used to connect with patients for Virtual Visits (Telemedicine).  Patients are able to view lab/test results, encounter notes, upcoming appointments, etc.  Non-urgent messages can be sent to your provider as well.   To learn more about what you can do with MyChart, go to [email or web address].    Your next appointment:   3 month(s)  The format for your next appointment:   In Person  Provider:   Berniece Salines, DO   Other Instructions      Adopting a Healthy Lifestyle.  Know what a healthy weight is for you (roughly BMI <25) and aim to maintain this    Aim for 7+ servings of fruits and vegetables daily   65-80+ fluid ounces of water or unsweet tea for healthy kidneys   Limit to max 1 drink of alcohol per day; avoid smoking/tobacco   Limit animal fats in diet for cholesterol and heart health - choose grass fed whenever available   Avoid highly processed foods, and foods high in saturated/trans fats   Aim for low stress - take time to unwind and care for your mental health   Aim for 150 min of moderate intensity exercise weekly for heart health, and weights twice weekly for bone health   Aim for 7-9 hours of sleep daily   When it comes to diets, agreement about the perfect plan isnt easy to find, even among the experts. Experts at the [place] developed an idea known as the Healthy Eating Plate. Just imagine a plate divided into logical, healthy portions.   The emphasis is on diet quality:   Load up on vegetables and fruits - one-half of your plate: Aim for color and variety, and remember that potatoes dont count.   Go for whole grains - one-quarter of your plate: Whole wheat, barley, wheat berries, quinoa, oats, brown rice, and foods made with them. If you want pasta, go with whole wheat pasta.   Protein power - one-quarter of your plate: Fish, chicken, beans, and nuts are all healthy, versatile protein sources. Limit red meat.   The diet, however, does go beyond the plate, offering a few other suggestions.   Use healthy plant oils, such as olive, canola, soy, corn, sunflower and peanut. Check the labels, and avoid partially hydrogenated oil, which have unhealthy trans fats.   If youre thirsty, drink water. Coffee and tea are good in moderation, but skip sugary drinks and limit milk and dairy products  to one or two daily servings.   The type of carbohydrate in the diet is more important than the amount. Some sources of carbohydrates, such as vegetables, fruits, whole grains, and beans-are healthier than  others.   Finally, stay active  Signed, Berniece Salines, DO  02/29/2020 3:54 PM     [facility]

## 2020-03-03 ENCOUNTER — Telehealth: Payer: Self-pay | Admitting: *Deleted

## 2020-03-03 NOTE — Telephone Encounter (Signed)
PA request for split night sleep study faxed to [facility].

## 2020-03-05 ENCOUNTER — Telehealth: Payer: Self-pay | Admitting: *Deleted

## 2020-03-05 NOTE — Telephone Encounter (Signed)
Staff message sent to [facility] ok to schedule sleep study. [employer] Auth received. Authorization # [id number]. Valid 03/11/20 to /06/11/20.

## 2020-03-06 ENCOUNTER — Telehealth: Payer: Self-pay

## 2020-03-06 NOTE — Telephone Encounter (Signed)
Called patient to tell her about her sleep study on April 10th at 8pm. No answer, will send a MyChart message.

## 2020-03-24 ENCOUNTER — Other Ambulatory Visit: Payer: Self-pay

## 2020-03-24 ENCOUNTER — Telehealth: Payer: Self-pay | Admitting: Cardiology

## 2020-03-24 MED ORDER — PROPRANOLOL HCL 20 MG PO TABS: 20.0000 mg | ORAL_TABLET | Freq: Every evening | ORAL | 3 refills | Status: DC

## 2020-03-24 NOTE — Telephone Encounter (Signed)
Spoke with patient.

## 2020-03-24 NOTE — Telephone Encounter (Signed)
Thanks

## 2020-03-24 NOTE — Telephone Encounter (Signed)
Patient returning call for her result from the heart monitor

## 2020-03-26 ENCOUNTER — Other Ambulatory Visit: Payer: Self-pay

## 2020-03-26 ENCOUNTER — Ambulatory Visit (HOSPITAL_BASED_OUTPATIENT_CLINIC_OR_DEPARTMENT_OTHER)
Admission: RE | Admit: 2020-03-26 | Discharge: 2020-03-26 | Disposition: A | Discharge status: Home / Self Care | Payer: 59 | Source: Ambulatory Visit | Attending: Cardiology | Admitting: Cardiology

## 2020-03-26 DIAGNOSIS — R0602 Shortness of breath: Secondary | ICD-10-CM | POA: Diagnosis present

## 2020-03-26 LAB — ECHOCARDIOGRAM COMPLETE
Area-P 1/2: 3.63 cm2
S' Lateral: 2.69 cm

## 2020-03-27 ENCOUNTER — Telehealth: Payer: Self-pay

## 2020-03-27 LAB — LIPID PANEL
Chol/HDL Ratio: 3.9 ratio (ref 0.0–4.4)
Cholesterol, Total: 247 mg/dL — ABNORMAL HIGH (ref 100–199)
HDL: 63 mg/dL (ref >39)
LDL Chol Calc (NIH): 167 mg/dL — ABNORMAL HIGH (ref 0–99)
Triglycerides: 96 mg/dL (ref 0–149)
VLDL Cholesterol Cal: 17 mg/dL (ref 5–40)

## 2020-03-27 MED ORDER — ROSUVASTATIN CALCIUM 5 MG PO TABS
5.0000 mg | ORAL_TABLET | Freq: Every day | ORAL | 2 refills | Status: DC
Start: 2020-03-27 — End: 2020-04-08

## 2020-03-27 NOTE — Telephone Encounter (Signed)
Of the visit for 03/31/20 patient has seen cardiology and has a pending sleep study with him in April. I called pt and left a vm advised to call back and let us know if she would like to pursue sleep testing through our office or continue to be followed by cardiology.

## 2020-03-27 NOTE — Telephone Encounter (Signed)
-----   Message from Berniece Salines, DO sent at 03/27/2020  3:22 PM EDT ----- Your lipid profile show the LDL is 167, total cholesterol 247, HDL 63 and triglycerides 96.  Ideally I like to start you on low intensity statin Crestor 5 mg daily if you are willing.

## 2020-03-31 ENCOUNTER — Institutional Professional Consult (permissible substitution): Payer: 59 | Admitting: Neurology

## 2020-03-31 ENCOUNTER — Ambulatory Visit: Payer: 59 | Admitting: Family Medicine

## 2020-03-31 NOTE — Telephone Encounter (Signed)
I called the pt and we discussed appt for today. Pt was agreeable to continue to follow with cardiology at this time and complete sleep testing through them. appt for today has been c/a.

## 2020-03-31 NOTE — Telephone Encounter (Signed)
Pt has called Megan,RN back. Please call. 

## 2020-04-01 ENCOUNTER — Other Ambulatory Visit: Payer: Self-pay

## 2020-04-01 ENCOUNTER — Encounter: Payer: Self-pay | Admitting: Family Medicine

## 2020-04-01 ENCOUNTER — Ambulatory Visit (INDEPENDENT_AMBULATORY_CARE_PROVIDER_SITE_OTHER): Payer: 59 | Admitting: Family Medicine

## 2020-04-01 VITALS — BP 114/81 | HR 72 | Temp 98.0°F | Ht 62.0 in | Wt 225.0 lb

## 2020-04-01 DIAGNOSIS — R400 Somnolence: Secondary | ICD-10-CM

## 2020-04-01 DIAGNOSIS — J4540 Moderate persistent asthma, uncomplicated: Secondary | ICD-10-CM | POA: Diagnosis not present

## 2020-04-01 DIAGNOSIS — R5383 Other fatigue: Secondary | ICD-10-CM | POA: Diagnosis not present

## 2020-04-01 DIAGNOSIS — E669 Obesity, unspecified: Secondary | ICD-10-CM

## 2020-04-01 DIAGNOSIS — R7401 Elevation of levels of liver transaminase levels: Secondary | ICD-10-CM | POA: Diagnosis not present

## 2020-04-01 DIAGNOSIS — R0600 Dyspnea, unspecified: Secondary | ICD-10-CM | POA: Diagnosis not present

## 2020-04-01 DIAGNOSIS — I471 Supraventricular tachycardia: Secondary | ICD-10-CM

## 2020-04-01 DIAGNOSIS — E039 Hypothyroidism, unspecified: Secondary | ICD-10-CM

## 2020-04-01 DIAGNOSIS — R059 Cough, unspecified: Secondary | ICD-10-CM

## 2020-04-01 DIAGNOSIS — R0602 Shortness of breath: Secondary | ICD-10-CM

## 2020-04-01 DIAGNOSIS — F418 Other specified anxiety disorders: Secondary | ICD-10-CM

## 2020-04-01 DIAGNOSIS — R0609 Other forms of dyspnea: Secondary | ICD-10-CM

## 2020-04-01 DIAGNOSIS — R002 Palpitations: Secondary | ICD-10-CM

## 2020-04-01 NOTE — Progress Notes (Signed)
This visit occurred during the SARS-CoV-2 public health emergency.  Safety protocols were in place, including screening questions prior to the visit, additional usage of staff PPE, and extensive cleaning of exam room while observing appropriate contact time as indicated for disinfecting solutions.    Cathy Baxter , 05-02-1960, 60 y.o., female MRN: [id number] Patient Care Team    Relationship Specialty Notifications Start End  Ma Hillock, DO PCP - General Family Medicine  08/30/16   Philemon Kingdom, MD Consulting Physician Internal Medicine  02/20/16    Comment: endocrine  Linda Hedges, [place] Physician Obstetrics and Gynecology  02/20/16   Mcarthur Rossetti, MD Consulting Physician Orthopedic Surgery  07/11/18   Wylene Simmer, MD Consulting Physician Orthopedic Surgery  02/27/20   Paralee Cancel, MD Consulting Physician Orthopedic Surgery  02/27/20     Chief Complaint  Patient presents with  . Follow-up     Subjective: Pt presents for an OV to follow up on  [facility] Palpitations/fatigue/depression anxiety: Patient now has started the Cymbalta and feels it has been helpful for her depression and anxiety.  She reports she is still having occasional palpitations but she has seen cardiology and was reassured with a normal echo.  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  Audiology also started her on Crestor after completing lipids secondary to elevated LDL.  She reports she does have a sleep study scheduled next month. Prior note She reports her palpitations have improved.  She is still fatigued at times and admits she still has some shortness of breath with activity.  She does not feel as depressed as she did when she was last seen but she is still having depression symptoms and anxiety.  She has not started the Cymbalta secondary to insurance.  States she has a new  insurance now and she is ready to start. Prior note:  Pt presents for an OV with complaints of fatigue.  Patient reports she has been excessively sleepy over the last year.  Over the last 2-3 months she has been sleeping 18 of 24 hours a day.  She becomes fatigued very easily and reports shortness of breath with activity.  She has gained weight over this time.  She reports her diet has been unchanged and she does not eat much daily.  She has gained 10 pounds over the last few months.  She has been supplementing with B12, vitamin D and a woman's vitamin.  She reports palpitations have been worsening over the last 2 months happen multiple times a day and last for couple seconds at a time.  She denies caffeine use.  She states she also had a sinus infection and was treated with azithromycin a few months ago and had extreme palpitations with use.  She has a history of asthma, and reports she does take her Advair as directed.  She does not have insurance at this time but uses [facility] for her prescriptions. She reports she has not been taking her Xyzal or Singulair.  Hypothyroidism, unspecified type/obesity Pt reports compliance of levothyroxine 25 mcg.  TSH normal 12/12/2019.  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  However, she states she still feels short of breath.  She has seen cardiology who has ruled out cardiac cause for  her chronic shortness of breath.  She is compliant with her allergy regimen.  She had a normal echocardiogram.  She did have "SVT "on occasions with her cardiac monitor.  Not felt to be related to her chronic dyspnea  Primary osteoarthritis of both knees/Primary osteoarthritis of both hands/DDD (degenerative disc disease), lumbar She has orthopedic and podiatry appointments arranged.  She is not on NSAIDs secondary to change in liver and kidney  function.  She has started the Cymbalta and feels its helpful.  Depression screen [facility] 2/9 12/12/2019 07/25/2018 06/01/2017 02/20/2016  Decreased Interest 3 0 0 0  Down, Depressed, Hopeless 3 0 0 0  PHQ - 2 Score 6 0 0 0  Altered sleeping 3 - - -  Tired, decreased energy 3 - - -  Change in appetite 3 - - -  Feeling bad or failure about yourself  3 - - -  Trouble concentrating 3 - - -  Moving slowly or fidgety/restless 0 - - -  Suicidal thoughts 0 - - -  PHQ-9 Score 21 - - -    Allergies  Allergen Reactions  . Azithromycin Palpitations    Arrhythmia side effects  . Percocet [Oxycodone-Acetaminophen] Shortness Of Breath  . Amoxicillin Hives    Has patient had a PCN reaction causing immediate rash, facial/tongue/throat swelling, SOB or lightheadedness with hypotension: Yes Has patient had a PCN reaction causing severe rash involving mucus membranes or skin necrosis: No Has patient had a PCN reaction that required hospitalization No Has patient had a PCN reaction occurring within the last 10 years: Yes  If all of the above answers are "NO", then may proceed with Cephalosporin use.   Marland Kitchen Doxycycline Itching  . Oxycodone   . 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   . Demerol [Meperidine] Palpitations   Social History   Social History Narrative   Divorced. She has had 3 children, one passed away young from cancer.    11th grade education. Works at [employer].    Former smoker.   Drinks caffeine.    Wears seatbelt, smoke detector in the home.    Firearms in the home.    Feels safe in her relationships.    Past Medical History:  Diagnosis Date  . Allergy   . Anemia   . Arthritis   . Asthma   . Chicken pox   . GERD (gastroesophageal reflux disease)   . Hiatal hernia   . Pigmentary glaucoma of both eyes   . Polyp of stomach   . Shingles   . Thyroid disease   . Urinary incontinence   . Uterine fibroid   . Uterine fibroids in pregnancy, postpartum condition 11/06/2015   Past  Surgical History:  Procedure Laterality Date  . CHOLECYSTECTOMY  2002  . EYE SURGERY    . KNEE ARTHROSCOPY Left 08/2015  . NASAL SINUS SURGERY  2000   Family History  Problem Relation Age of Onset  . Heart disease Mother   . Early death Father   . Cancer Son 10       Neuroepithelioma   . Breast cancer Sister 63   Allergies as of 04/01/2020      Reactions   Azithromycin Palpitations   Arrhythmia side effects   Percocet [oxycodone-acetaminophen] Shortness Of Breath   Amoxicillin Hives   Has patient had a PCN reaction causing immediate rash, facial/tongue/throat swelling, SOB or lightheadedness with hypotension: Yes Has patient had a PCN reaction causing severe rash involving mucus membranes or  skin necrosis: No Has patient had a PCN reaction that required hospitalization No Has patient had a PCN reaction occurring within the last 10 years: Yes  If all of the above answers are "NO", then may proceed with Cephalosporin use.   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    Demerol [meperidine] Palpitations      Medication List       Accurate as of April 01, 2020 11:59 PM. If you have any questions, ask your nurse or doctor.        albuterol 108 (90 Base) MCG/ACT inhaler Commonly known as: ProAir HFA Inhale 2 puffs into the lungs every 6 (six) hours as needed for wheezing or shortness of breath.   b complex vitamins capsule Take 1 capsule by mouth daily.   brimonidine 0.2 % ophthalmic solution Commonly known as: ALPHAGAN Place 1 drop into both eyes 2 (two) times daily.   dorzolamide-timolol 22.3-6.8 MG/ML ophthalmic solution Commonly known as: COSOPT Place 1 drop into both eyes 2 (two) times daily.   DULoxetine 30 MG capsule Commonly known as: Cymbalta Take 1 capsule (30 mg total) by mouth daily.   fexofenadine 180 MG tablet Commonly known as: ALLEGRA Take 1 tablet (180 mg total) by mouth daily.   Fluticasone-Salmeterol 250-50  MCG/DOSE Aepb Commonly known as: Advair Diskus Inhale 1 puff into the lungs 2 (two) times daily.   levothyroxine 25 MCG tablet Commonly known as: SYNTHROID Take 1 tablet (25 mcg total) by mouth daily before breakfast.   propranolol 20 MG tablet Commonly known as: INDERAL Take 1 tablet (20 mg total) by mouth at bedtime.   rosuvastatin 5 MG tablet Commonly known as: CRESTOR Take 1 tablet (5 mg total) by mouth daily.   Vitamin D3 75 MCG (3000 UT) Tabs Take by mouth.   Vitamin D3 50 MCG (2000 UT) Tabs Take 1 tablet by mouth daily.       All past medical history, surgical history, allergies, family history, immunizations andmedications were updated in the EMR today and reviewed under the history and medication portions of their EMR.     ROS: Negative, with the exception of above mentioned in HPI   Objective:  BP 114/81   Pulse 72   Temp 98 F (36.7 C) (Oral)   Ht '5\' 2"'  (1.575 m)   Wt 225 lb (102.1 kg)   SpO2 95%   BMI 41.15 kg/m  Body mass index is 41.15 kg/m. Gen: Afebrile. No acute distress. Nontoxic. Pleasant female.  HENT: AT. [place].  Eyes:Pupils Equal Round Reactive to light, Extraocular movements intact,  Conjunctiva without redness, discharge or icterus. Neck/lymp/endocrine: Supple,no lymphadenopathy, no thyromegaly CV: RRR no murmur, no edema Chest: CTAB, no wheeze or crackles. Mild rhonchi present. Neuro:  Normal gait. PERLA. EOMi. Alert. Oriented x3 Psych: Normal affect, dress and demeanor. Normal speech. Normal thought content and judgment.   No exam data present No results found. Results for orders placed or performed in visit on 04/01/20 (from the past 24 hour(s))  HIV antibody (with reflex)     Status: None   Collection Time: 04/01/20  1:38 PM  Result Value Ref Range   HIV 1&2 Ab, 4th Generation NON-REACTIVE NON-REACTI  Sedimentation rate     Status: None   Collection Time: 04/01/20  1:38 PM  Result Value Ref Range   Sed Rate 17 0 - 30 mm/h     Assessment/Plan: Guynell Kleiber is a 60 y.o. female present  for OV for  Hypothyroidism, unspecified type/obesity Stable.  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  However still complaining of dyspnea with activity has been ruled out from a cardiac standpoint. 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  Placed referral to asthma and allergy for further evaluation as asthma as the cause of her symptoms. CT chest ordered to rule out lung disease as cause has her chronic dyspnea  Primary osteoarthritis of both knees/Primary osteoarthritis of both hands/DDD (degenerative disc disease), lumbar/left meniscal horn tear  Unfortunately steroids are not ideal with her history of significant glaucoma NSAIDs are on hold secondary to liver and kidney function changes She does feel the Cymbalta is helpful for her lower back however her knees are still hurting her.  And she has an appointment with orthopedics.  Function kidney decreased/transaminitis Discontinued chronic NSAIDs and she is hydrating> GFR improved last visit.  Liver enzymes increased. ESR, CRP, hepatitis panel, HIV collected today for elevated liver enzymes. Consider right upper quadrant ultrasound, depending upon results  Vitamin D deficiency -She is supplementing her vitamin D.  Obesity (BMI 30-39.9)/daytime somnolence/palpitations Patient reports she does have a sleep apnea eval scheduled mid April.  This provider had referred her to go [facility] and then her cardiologist also referred her.  She is uncertain who the appointment with is in April for the sleep study.  Depression with anxiety Continue Cymbalta 30 mg daily.   Improvement is noted with start of medication Follow-up 5.5 months, sooner if needed    Reviewed expectations re: course of current medical issues.  Discussed self-management of symptoms.  Outlined signs and symptoms indicating need for more acute intervention.  Patient verbalized understanding and all questions were answered.  Patient received an After-Visit Summary.    Orders Placed This Encounter  Procedures  . HIV antibody (with reflex)  . Hepatitis, Acute  . Comp Met (CMET)  . C-reactive protein  . Sedimentation rate  . Ambulatory referral to Allergy   No orders of the defined types were placed in this encounter.   Referral Orders     Ambulatory referral to Allergy   Note is dictated utilizing voice recognition software. Although note has been proof read prior to signing, occasional typographical errors still can be missed. If any questions arise, please do not hesitate to call for verification.   electronically signed by:  Howard Pouch, DO  [place]

## 2020-04-02 DIAGNOSIS — I471 Supraventricular tachycardia, unspecified: Secondary | ICD-10-CM | POA: Insufficient documentation

## 2020-04-02 LAB — COMPREHENSIVE METABOLIC PANEL
ALT: 42 U/L — ABNORMAL HIGH (ref 0–35)
AST: 29 U/L (ref 0–37)
Albumin: 4.4 g/dL (ref 3.5–5.2)
Alkaline Phosphatase: 64 U/L (ref 39–117)
BUN: 17 mg/dL (ref 6–23)
CO2: 26 mEq/L (ref 19–32)
Calcium: 9.2 mg/dL (ref 8.4–10.5)
Chloride: 104 mEq/L (ref 96–112)
Creatinine, Ser: 0.69 mg/dL (ref 0.40–1.20)
GFR: 94.39 mL/min (ref >60.00)
Glucose, Bld: 94 mg/dL (ref 70–99)
Potassium: 4.3 mEq/L (ref 3.5–5.1)
Sodium: 139 mEq/L (ref 135–145)
Total Bilirubin: 0.5 mg/dL (ref 0.2–1.2)
Total Protein: 7.4 g/dL (ref 6.0–8.3)

## 2020-04-02 LAB — SEDIMENTATION RATE: Sed Rate: 17 mm/h (ref 0–30)

## 2020-04-02 LAB — HEPATITIS PANEL, ACUTE
Hep A IgM: NONREACTIVE
Hep B C IgM: NONREACTIVE
Hepatitis B Surface Ag: NONREACTIVE
Hepatitis C Ab: NONREACTIVE
SIGNAL TO CUT-OFF: 0.01 (ref <1.00)

## 2020-04-02 LAB — C-REACTIVE PROTEIN: CRP: <1 mg/dL (ref 0.5–20.0)

## 2020-04-02 LAB — HIV ANTIBODY (ROUTINE TESTING W REFLEX): HIV 1&2 Ab, 4th Generation: NONREACTIVE

## 2020-04-03 ENCOUNTER — Telehealth: Payer: Self-pay | Admitting: Family Medicine

## 2020-04-03 NOTE — Telephone Encounter (Signed)
Please call patient: HIV and hepatitis panel were negative> these were completed secondary to elevated liver enzymes Inflammatory markers were normal. Her liver enzymes improved and back to her baseline-with just a mild elevation in the ALT.since the rest of the work-up was normal, this is likely elevated secondary to "fatty liver disease. " This is common finding in patients and has been known to be caused by elevated cholesterol and obesity.  I have referred her to allergy and asthma as we discussed for further evaluation of her feeling short of breath. I have also placed an order for CT chest to evaluate her chronic shortness of breath and cough.

## 2020-04-03 NOTE — Telephone Encounter (Signed)
Spoke with pt regarding labs and instructions.   

## 2020-04-07 ENCOUNTER — Telehealth: Payer: Self-pay | Admitting: Cardiology

## 2020-04-07 ENCOUNTER — Telehealth: Payer: Self-pay

## 2020-04-07 NOTE — Telephone Encounter (Signed)
Left message for patient to return our call.

## 2020-04-07 NOTE — Telephone Encounter (Signed)
Pt c/o medication issue:  1. Name of Medication: rosuvastatin (CRESTOR) 5 MG tablet  2. How are you currently taking this medication (dosage and times per day)? No longer taking  3. Are you having a reaction (difficulty breathing--STAT)? no  4. What is your medication issue? Patient states she had severe back pain from the medication. She states she stopped taking it after 2 days. Please advise.

## 2020-04-07 NOTE — Telephone Encounter (Signed)
Please inform pt her CT chest was denied by her insurance.  We will wait on asthma and allergies eval and they may consider ordering CT if they feel appropriate.

## 2020-04-07 NOTE — Telephone Encounter (Signed)
Pt informed

## 2020-04-07 NOTE — Telephone Encounter (Signed)
CT Denied fax received. Sent to scan

## 2020-04-08 ENCOUNTER — Telehealth: Payer: Self-pay

## 2020-04-08 ENCOUNTER — Telehealth: Payer: Self-pay | Admitting: Family Medicine

## 2020-04-08 MED ORDER — ATORVASTATIN CALCIUM 20 MG PO TABS: 20.0000 mg | ORAL_TABLET | Freq: Every day | ORAL | 3 refills | Status: DC

## 2020-04-08 NOTE — Telephone Encounter (Signed)
Spoke with patient about stopping Crestor and starting Lipitor 20 mg once daily. Patient verbalizes understanding. No questions or concerns at this time.

## 2020-04-08 NOTE — Telephone Encounter (Signed)
Please advise 

## 2020-04-08 NOTE — Telephone Encounter (Signed)
Patient called regarding CT auth recently denied by [facility]. She states she spoke with insurance company and they requested peer to peer with Dr. Raoul Pitch. Patient provided case# [id number] and phone# [phone]. Patient also stated the insurance told her it has to be today or the case will be completely closed.

## 2020-04-08 NOTE — Telephone Encounter (Signed)
Prescription sent to pharmacy.

## 2020-04-20 ENCOUNTER — Ambulatory Visit (HOSPITAL_BASED_OUTPATIENT_CLINIC_OR_DEPARTMENT_OTHER): Payer: 59 | Attending: Cardiology | Admitting: Cardiovascular Disease

## 2020-04-20 ENCOUNTER — Other Ambulatory Visit: Payer: Self-pay

## 2020-04-20 DIAGNOSIS — G4736 Sleep related hypoventilation in conditions classified elsewhere: Secondary | ICD-10-CM | POA: Insufficient documentation

## 2020-04-20 DIAGNOSIS — R0902 Hypoxemia: Secondary | ICD-10-CM | POA: Diagnosis not present

## 2020-04-20 DIAGNOSIS — R400 Somnolence: Secondary | ICD-10-CM | POA: Insufficient documentation

## 2020-05-07 ENCOUNTER — Telehealth: Payer: Self-pay | Admitting: Cardiovascular Disease

## 2020-05-07 NOTE — Telephone Encounter (Signed)
Follow Up:      Pt would like her Sleep Study results please.

## 2020-05-07 NOTE — Telephone Encounter (Signed)
Spoke to patient stated she was calling for 04/20/20 sleep study results.Advised results not available.Advised Dr.Kelly is out of office this week.I will send message to him.

## 2020-05-08 ENCOUNTER — Other Ambulatory Visit: Payer: Self-pay

## 2020-05-08 ENCOUNTER — Encounter: Payer: Self-pay | Admitting: Allergy

## 2020-05-08 ENCOUNTER — Ambulatory Visit: Payer: 59 | Admitting: Allergy

## 2020-05-08 VITALS — BP 110/70 | HR 69 | Temp 97.3°F | Resp 16 | Ht 62.0 in | Wt 226.0 lb

## 2020-05-08 DIAGNOSIS — J4551 Severe persistent asthma with (acute) exacerbation: Secondary | ICD-10-CM | POA: Diagnosis not present

## 2020-05-08 DIAGNOSIS — J3089 Other allergic rhinitis: Secondary | ICD-10-CM

## 2020-05-08 DIAGNOSIS — J45909 Unspecified asthma, uncomplicated: Secondary | ICD-10-CM

## 2020-05-08 MED ORDER — LEVOCETIRIZINE DIHYDROCHLORIDE 5 MG PO TABS: 5.0000 mg | ORAL_TABLET | Freq: Every evening | ORAL | 5 refills | Status: DC

## 2020-05-08 MED ORDER — BREZTRI AEROSPHERE 160-9-4.8 MCG/ACT IN AERO: 2.0000 | INHALATION_SPRAY | Freq: Two times a day (BID) | RESPIRATORY_TRACT | 3 refills | Status: DC

## 2020-05-08 NOTE — Progress Notes (Signed)
New Patient Note  RE: Cathy Baxter MRN: [id number] DOB: 1960-05-28 Date of Office Visit: 05/08/2020  Consult requested by: Ma Hillock, DO Primary care provider: Ma Hillock, DO  Chief Complaint: Asthma  History of Present Illness: I had the pleasure of seeing Cathy Baxter for initial evaluation at the [facility] on 05/09/2020. She is a 60 y.o. female, who is referred here by Howard Pouch A, DO for the evaluation of asthma.  She reports symptoms of chest tightness, shortness of breath, coughing, wheezing, nocturnal awakenings for 20+ years. Current medications include Advair 243mcg 1 puff BID x 1 yr and albuterol prn which help. She tried the following inhalers: Dulera. Main triggers are unknown. In the last month, frequency of symptoms: <1x/week. Frequency of SABA use: <1x/week. Interference with physical activity: no. In the last 12 months, emergency room visits/urgent care visits/doctor office visits or hospitalizations due to respiratory issues: 1-2x within the past year. 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 Lifetime history of hospitalization for respiratory issues: no. Prior intubations: no.  History of pneumonia: yes 10+ years ago. She was evaluated by allergist/pulmonologist in the past. Smoking exposure: no. Up to date with flu vaccine: no. Up to date with COVID-19 vaccine: yes. Prior Covid-19 infection: no. History of reflux: yes and takes Nexium prn.   Patient had sleep study done earlier this month - not sure of results.   She reports symptoms of sinus headache, nasal congestion. Symptoms have been going on for many years. The symptoms are present all year around. Other triggers include exposure to unknown. Anosmia: no. Headache: yes. She has used zyrtec, allegra, Xyzal with fair improvement in symptoms. Used to be on Flonase but stopped due to glaucoma. Sinus infections: not recently. Previous  work up includes: skin testing 2014 was positive dust mites, cat, dog, horse, feathers, grass, trees, pigweed, mold. Patient was on allergy immunotherapy for less than 1 year. Previous ENT evaluation: sinus surgery in 2000. No recent evaluation.  History of nasal polyps: yes. Last eye exam: 6 months.  12/12/2019 CXR: "IMPRESSION: No active cardiopulmonary disease."  04/01/2020 PCP visit: "Patient reports she has restarted her Advair and feels it has improved her dyspnea.  However, she states she still feels short of breath.  She has seen cardiology who has ruled out cardiac cause for her chronic shortness of breath.  She is compliant with her allergy regimen.  She had a normal echocardiogram.  She did have "SVT "on occasions with her cardiac monitor.  Not felt to be related to her chronic dyspnea"  Assessment and Plan: Cathy Baxter is a 60 y.o. female with: Asthma Issues with her breathing for 20+ years.  Currently on Advair 250 mcg 1 puff twice a day x1 year and albuterol as needed with good benefit.  Cathy Baxter in the past.  Using albuterol less than once per week.  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  Takes Nexium as needed for reflux.  Seen cardiology as well.  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   If no improvement with below regimen, will discuss starting biologics.   Spirometry today showed some restriction with 11% improvement in FEV1 post bronchodilator treatment.  Clinically feeling improved. . Daily controller medication(s): Stop Advair for now.  o Start Breztri 153mcg 2 puffs twice a day with spacer and rinse mouth afterwards. - Samples give and demonstrated proper  use.  o Spacer given and demonstrated proper use with inhaler. Patient understood technique and all questions/concerned were addressed.  . May use albuterol rescue inhaler 2 puffs every 4 to 6 hours as needed for shortness of breath, chest tightness, coughing,  and wheezing. May use albuterol rescue inhaler 2 puffs 5 to 15 minutes prior to strenuous physical activities. Monitor frequency of use.  . Repeat spirometry at next visit.  Other allergic rhinitis Perennial rhinitis symptoms with no specific triggers.  Tried Zyrtec, Allegra and Xyzal with some benefit.  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  Skin testing in 2014 was positive to dust mites, cat, dog, horse, feathers, grass, trees, pigweed, mold.  She was on allergy immunotherapy for less than 1 year.  She also had sinus surgery in 2000.  No pets at home.  Today's skin testing showed: Positive to cats, dogs.  Negative to common foods.  Start environmental control measures as below.  May use over the counter antihistamines such as Xyzal (levocetirizine) daily as needed.  Nasal saline spray (i.e., Simply Saline) or nasal saline lavage (i.e., NeilMed) is recommended as needed and prior to medicated nasal sprays.  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   Recommend ENT evaluation next due to prior sinus surgeries as above allergens do not support her symptoms.    Return in about 2 months (around 07/08/2020).  Meds ordered this encounter  Medications  . levocetirizine (XYZAL) 5 MG tablet    Sig: Take 1 tablet (5 mg total) by mouth every evening.    Dispense:  30 tablet    Refill:  5  . Budeson-Glycopyrrol-Formoterol (BREZTRI AEROSPHERE) 160-9-4.8 MCG/ACT AERO    Sig: Inhale 2 puffs into the lungs in the morning and at bedtime. with spacer and rinse mouth afterwards.    Dispense:  10.7 g    Refill:  3   Lab Orders  No laboratory test(s) ordered today    Other allergy screening: Food allergy: no Medication allergy: yes Hymenoptera allergy: no Urticaria: no Eczema:no History of recurrent infections suggestive of immunodeficency: no  Diagnostics: Spirometry:  Tracings reviewed. Her effort: Good reproducible efforts. FVC: 2.21L FEV1 1.55L, 67% predicted FEV1/FVC ratio:  70% Interpretation: Spirometry consistent with possible restrictive disease with 11% improvement in FEV1 post bronchodilator treatment. Clinically feeling improved.    Please see scanned spirometry results for details.  Skin Testing: Environmental allergy panel and select foods. Positive to cats, dogs.  Negative to common foods. Results discussed with patient/family.  Airborne Adult Perc - 05/08/20 1448    Time Antigen Placed 1448    Allergen Manufacturer Waynette Buttery    Location Back    Number of Test 59    1. Control-Buffer 50% Glycerol Negative    2. Control-Histamine 1 mg/ml 2+    3. Albumin saline Negative    4. Bahia Negative    5. French Southern Territories Negative    6. Johnson Negative    7. Kentucky Blue Negative    8. Meadow Fescue Negative    9. Perennial Rye Negative    10. Sweet Vernal Negative    11. Timothy Negative    12. Cocklebur Negative    13. Burweed Marshelder Negative    14. Ragweed, short Negative    15. Ragweed, Giant Negative    16. Plantain,  English Negative    17. Lamb's Quarters Negative    18. Sheep Sorrell Negative    19. Rough Pigweed Negative    20. Michail Jewels Elder, Rough Negative  21. Mugwort, Common Negative    22. Ash mix Negative    23. Birch mix Negative    24. Beech American Negative    25. Box, Elder Negative    26. Cedar, red Negative    27. Cottonwood, [place] Negative    28. Elm mix Negative    29. Hickory Negative    30. Maple mix Negative    31. Oak, [place] mix Negative    32. Pecan Pollen Negative    33. Pine mix Negative    34. Sycamore Eastern Negative    35. Douglas, Black Pollen Negative    36. Alternaria alternata Negative    37. Cladosporium Herbarum Negative    38. Aspergillus mix Negative    39. Penicillium mix Negative    40. Bipolaris sorokiniana (Helminthosporium) Negative    41. Drechslera spicifera (Curvularia) Negative    42. Mucor plumbeus Negative    43. Fusarium moniliforme Negative    44. Aureobasidium pullulans  (pullulara) Negative    45. Rhizopus oryzae Negative    46. Botrytis cinera Negative    47. Epicoccum nigrum Negative    48. Phoma betae Negative    49. Candida Albicans Negative    50. Trichophyton mentagrophytes Negative    51. Mite, D Farinae  5,000 AU/ml Negative    52. Mite, D Pteronyssinus  5,000 AU/ml Negative    53. Cat Hair 10,000 BAU/ml 4+    54.  Dog Epithelia Negative    55. Mixed Feathers Negative    56. Horse Epithelia Negative    57. Cockroach, German Negative    58. Mouse Negative    59. Tobacco Leaf Negative          Food Perc - 05/08/20 1449      Test Information   Time Antigen Placed [id number]    Allergen Manufacturer Lavella Hammock    Location Back    Number of allergen test 10      Food   1. Peanut Negative    2. Soybean food Negative    3. Wheat, whole Negative    4. Sesame Negative    5. Milk, cow Negative    6. Egg White, chicken Negative    7. Casein Negative    8. Shellfish mix Negative    9. Fish mix Negative    10. Cashew Negative          Intradermal - 05/08/20 1449    Time Antigen Placed 1449    Allergen Manufacturer Other    Location Arm    Number of Test 14    Control Negative    Gu[place] Negative    Johnson Negative    7 Grass Negative    Ragweed mix Negative    Weed mix Negative    Tree mix Negative    Mold 1 Negative    Mold 2 Negative    Mold 3 Negative    Mold 4 Negative    Dog 3+    Cockroach Negative    Mite mix Negative           Past Medical History: Patient Active Problem List   Diagnosis Date Noted  . Other allergic rhinitis 05/09/2020  . Paroxysmal SVT (supraventricular tachycardia) ([place]) 04/02/2020  . Dyspnea on exertion 02/27/2020  . Transaminitis 02/26/2020  . Gastroesophageal reflux disease 02/26/2020  . Morbid obesity ([place]) 02/26/2020  . Personal history of colonic polyps 02/26/2020  . Depression with anxiety 12/13/2019  . Palpitations 12/13/2019  . Shortness of  breath 12/13/2019  . Uncontrolled daytime  somnolence 12/12/2019  . Other fatigue 12/12/2019  . 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 DDD (degenerative disc disease), lumbar 04/10/2018  . 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 Primary osteoarthritis of both hands 04/10/2018  . Non-seasonal allergic rhinitis due to pollen 02/07/2018  . Other meniscus derangements, posterior horn of medial meniscus, left knee 11/24/2017  . Obesity (BMI 30-39.9) 02/20/2016  . Cataract of both eyes 02/20/2016  . Pigmentary glaucoma of both eyes   . Hyperlipidemia 11/06/2015  . Asthma 11/06/2015  . Hypothyroidism 10/17/2015  . Myopia of both eyes 09/19/2015   Past Medical History:  Diagnosis Date  . Allergy   . Anemia   . Arthritis   . Asthma   . Chicken pox   . GERD (gastroesophageal reflux disease)   . Hiatal hernia   . Pigmentary glaucoma of both eyes   . Polyp of stomach   . Shingles   . Thyroid disease   . Urinary incontinence   . Uterine fibroid   . Uterine fibroids in pregnancy, postpartum condition 11/06/2015   Past Surgical History: Past Surgical History:  Procedure Laterality Date  . CHOLECYSTECTOMY  2002  . EYE SURGERY    . KNEE ARTHROSCOPY Left 08/2015  . NASAL SINUS SURGERY  2000   Medication List:  Current Outpatient Medications  Medication Sig Dispense Refill  . albuterol (PROAIR HFA) 108 (90 Base) MCG/ACT inhaler Inhale 2 puffs into the lungs every 6 (six) hours as needed for wheezing or shortness of breath. 1 each 0  . atorvastatin (LIPITOR) 20 MG tablet Take 1 tablet (20 mg total) by mouth daily. 90 tablet 3  . b complex vitamins capsule Take 1 capsule by mouth daily.    . brimonidine (ALPHAGAN) 0.2 % ophthalmic solution Place 1 drop into both eyes 2 (two) times daily.    . Budeson-Glycopyrrol-Formoterol (BREZTRI AEROSPHERE) 160-9-4.8 MCG/ACT AERO Inhale 2 puffs into the lungs in the morning and at bedtime. with spacer and rinse mouth afterwards. 10.7 g 3  . Cholecalciferol (VITAMIN D3) 75  MCG (3000 UT) TABS Take by mouth.    . dorzolamide-timolol (COSOPT) 22.3-6.8 MG/ML ophthalmic solution Place 1 drop into both eyes 2 (two) times daily.     . DULoxetine (CYMBALTA) 30 MG capsule Take 1 capsule (30 mg total) by mouth daily. 90 capsule 1  . levocetirizine (XYZAL) 5 MG tablet Take 1 tablet (5 mg total) by mouth every evening. 30 tablet 5  . levothyroxine (SYNTHROID) 25 MCG tablet Take 1 tablet (25 mcg total) by mouth daily before breakfast. 90 tablet 3  . propranolol (INDERAL) 20 MG tablet Take 1 tablet (20 mg total) by mouth at bedtime. 90 tablet 3   No current facility-administered medications for this visit.   Allergies: Allergies  Allergen Reactions  . Azithromycin Palpitations    Arrhythmia side effects  . Percocet [Oxycodone-Acetaminophen] Shortness Of Breath  . Amoxicillin Hives    Has patient had a PCN reaction causing immediate rash, facial/tongue/throat swelling, SOB or lightheadedness with hypotension: Yes Has patient had a PCN reaction causing severe rash involving mucus membranes or skin necrosis: No Has patient had a PCN reaction that required hospitalization No Has patient had a PCN reaction occurring within the last 10 years: Yes  If all of the above answers are "NO", then may proceed with Cephalosporin use.   Marland Kitchen Doxycycline Itching  . Oxycodone   . 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   Marland Kitchen  Demerol [Meperidine] Palpitations   Social History: Social History   Socioeconomic History  . Marital status: Divorced    Spouse name: Not on file  . Number of children: Not on file  . Years of education: 79  . Highest education level: Not on file  Occupational History  . Occupation: stock  Tobacco Use  . Smoking status: Former Smoker    Packs/day: 3.00    Years: 12.00    Pack years: 36.00    Types: Cigarettes    Quit date: 07/12/1983    Years since quitting: 36.8  . Smokeless tobacco: Never Used  Vaping Use  . Vaping Use: Never used  Substance and  Sexual Activity  . Alcohol use: Not Currently    Comment: once every 6 months   . Drug use: No  . Sexual activity: Not on file  Other Topics Concern  . Not on file  Social History Narrative   Divorced. She has had 3 children, one passed away young from cancer.    11th grade education. Works at [employer].    Former smoker.   Drinks caffeine.    Wears seatbelt, smoke detector in the home.    Firearms in the home.    Feels safe in her relationships.    Social Determinants of Health   Financial Resource Strain: Not on file  Food Insecurity: Not on file  Transportation Needs: Not on file  Physical Activity: Not on file  Stress: Not on file  Social Connections: Not on file   Lives in a 61 year old trailer. Smoking: denies Occupation: unemployed  Environmental HistoryFreight forwarder in the house: no Charity fundraiser in the family room: yes Carpet in the bedroom: yes Heating: electric Cooling: heat pump Pet: no  Family History: Family History  Problem Relation Age of Onset  . Heart disease Mother   . Asthma Mother   . COPD Mother   . Early death Father   . Cancer Son 10       Neuroepithelioma   . Breast cancer Sister 78  . Celiac disease Daughter   . Allergic rhinitis Neg Hx   . Eczema Neg Hx   . Urticaria Neg Hx   . Angioedema Neg Hx   . Immunodeficiency Neg Hx   . Atopy Neg Hx    Review of Systems  Constitutional: Negative for appetite change, chills, fever and unexpected weight change.  HENT: Positive for congestion and sinus pressure. Negative for rhinorrhea.   Eyes: Negative for itching.  Respiratory: Positive for cough, chest tightness, shortness of breath and wheezing.   Cardiovascular: Negative for chest pain.  Gastrointestinal: Negative for abdominal pain.  Genitourinary: Negative for difficulty urinating.  Skin: Negative for rash.  Allergic/Immunologic: Positive for environmental allergies.  Neurological: Positive for headaches.   Objective: BP 110/70    Pulse 69   Temp (!) 97.3 F (36.3 C) (Temporal)   Resp 16   Ht 5\' 2"  (1.575 m)   Wt 226 lb (102.5 kg)   SpO2 96%   BMI 41.34 kg/m  Body mass index is 41.34 kg/m. Physical Exam Vitals and nursing note reviewed.  Constitutional:      Appearance: Normal appearance. She is well-developed.  HENT:     Head: Normocephalic and atraumatic.     Right Ear: External ear normal.     Left Ear: External ear normal.     Nose: Nose normal.     Mouth/Throat:     Mouth: Mucous membranes are moist.  Pharynx: Oropharynx is clear.  Eyes:     Conjunctiva/sclera: Conjunctivae normal.  Cardiovascular:     Rate and Rhythm: Normal rate and regular rhythm.     Heart sounds: Normal heart sounds. No murmur heard. No friction rub. No gallop.   Pulmonary:     Effort: Pulmonary effort is normal.     Breath sounds: Normal breath sounds. No wheezing, rhonchi or rales.  Abdominal:     Palpations: Abdomen is soft.  Musculoskeletal:     Cervical back: Neck supple.  Skin:    General: Skin is warm.     Findings: No rash.  Neurological:     Mental Status: She is alert and oriented to person, place, and time.  Psychiatric:        Behavior: Behavior normal.    The plan was reviewed with the patient/family, and all questions/concerned were addressed.  It was my pleasure to see Cathy Baxter today and participate in her care. Please feel free to contact me with any questions or concerns.  Sincerely,  Rexene Alberts, DO Allergy & Immunology  [facility] office: [place] office: [phone]

## 2020-05-08 NOTE — Patient Instructions (Addendum)
Today's skin testing showed: Positive to cats, dogs.  Negative to common foods. Results given.  Asthma: . Daily controller medication(s): Stop Advair for now.  o Start Breztri 161mcg 2 puffs twice a day with spacer and rinse mouth afterwards. - Samples give and demonstrated proper use.  o Spacer given and demonstrated proper use with inhaler. Patient understood technique and all questions/concerned were addressed.  . May use albuterol rescue inhaler 2 puffs every 4 to 6 hours as needed for shortness of breath, chest tightness, coughing, and wheezing. May use albuterol rescue inhaler 2 puffs 5 to 15 minutes prior to strenuous physical activities. Monitor frequency of use.  . Asthma control goals:  o Full participation in all desired activities (may need albuterol before activity) o Albuterol use two times or less a week on average (not counting use with activity) o Cough interfering with sleep two times or less a month o Oral steroids no more than once a year o No hospitalizations  Environmental allergies  Start environmental control measures as below.  May use over the counter antihistamines such as Xyzal (levocetirizine) daily as needed.  Nasal saline spray (i.e., Simply Saline) or nasal saline lavage (i.e., NeilMed) is recommended as needed and prior to medicated nasal sprays.  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   Recommend ENT evaluation next due to prior sinus surgeries - Dr. Benjamine Mola.   Follow up in 2 months or sooner if needed.   Pet Allergen Avoidance: . Contrary to popular opinion, there are no "hypoallergenic" breeds of dogs or cats. That is because people are not allergic to an animal's hair, but to an allergen found in the animal's saliva, dander (dead skin flakes) or urine. Pet allergy symptoms typically occur within minutes. For some people, symptoms can build up and become most severe 8 to 12 hours after contact with the animal. People with severe  allergies can experience reactions in public places if dander has been transported on the pet owners' clothing. Marland Kitchen Keeping an animal outdoors is only a partial solution, since homes with pets in the yard still have higher concentrations of animal allergens. . Before getting a pet, ask your allergist to determine if you are allergic to animals. If your pet is already considered part of your family, try to minimize contact and keep the pet out of the bedroom and other rooms where you spend a great deal of time. . As with dust mites, vacuum carpets often or replace carpet with a hardwood floor, tile or linoleum. . High-efficiency particulate air (HEPA) cleaners can reduce allergen levels over time. . While dander and saliva are the source of cat and dog allergens, urine is the source of allergens from rabbits, hamsters, mice and [place] pigs; so ask a non-allergic family member to clean the animal's cage. . If you have a pet allergy, talk to your allergist about the potential for allergy immunotherapy (allergy shots). This strategy can often provide long-term relief.

## 2020-05-09 ENCOUNTER — Encounter: Payer: Self-pay | Admitting: Allergy

## 2020-05-09 DIAGNOSIS — J3089 Other allergic rhinitis: Secondary | ICD-10-CM | POA: Insufficient documentation

## 2020-05-09 NOTE — Assessment & Plan Note (Signed)
Issues with her breathing for 20+ years.  Currently on Advair 250 mcg 1 puff twice a day x1 year and albuterol as needed with good benefit.  Cathy Baxter in the past.  Using albuterol less than once per week.  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  Takes Nexium as needed for reflux.  Seen cardiology as well.  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   If no improvement with below regimen, will discuss starting biologics.   Spirometry today showed some restriction with 11% improvement in FEV1 post bronchodilator treatment.  Clinically feeling improved. . Daily controller medication(s): Stop Advair for now.  o Start Breztri 116mcg 2 puffs twice a day with spacer and rinse mouth afterwards. - Samples give and demonstrated proper use.  o Spacer given and demonstrated proper use with inhaler. Patient understood technique and all questions/concerned were addressed.  . May use albuterol rescue inhaler 2 puffs every 4 to 6 hours as needed for shortness of breath, chest tightness, coughing, and wheezing. May use albuterol rescue inhaler 2 puffs 5 to 15 minutes prior to strenuous physical activities. Monitor frequency of use.  . Repeat spirometry at next visit.

## 2020-05-09 NOTE — Assessment & Plan Note (Signed)
Perennial rhinitis symptoms with no specific triggers.  Tried Zyrtec, Allegra and Xyzal with some benefit.  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  Skin testing in 2014 was positive to dust mites, cat, dog, horse, feathers, grass, trees, pigweed, mold.  She was on allergy immunotherapy for less than 1 year.  She also had sinus surgery in 2000.  No pets at home.  Today's skin testing showed: Positive to cats, dogs.  Negative to common foods.  Start environmental control measures as below.  May use over the counter antihistamines such as Xyzal (levocetirizine) daily as needed.  Nasal saline spray (i.e., Simply Saline) or nasal saline lavage (i.e., NeilMed) is recommended as needed and prior to medicated nasal sprays.  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   Recommend ENT evaluation next due to prior sinus surgeries as above allergens do not support her symptoms.

## 2020-05-10 ENCOUNTER — Encounter (HOSPITAL_BASED_OUTPATIENT_CLINIC_OR_DEPARTMENT_OTHER): Payer: Self-pay | Admitting: Cardiovascular Disease

## 2020-05-10 NOTE — Procedures (Signed)
Patient Name: Cathy Baxter, Cathy Baxter Date: 04/20/2020 Gender: Female D.O.B: Mar 06, 1960 Age (years): 25 Referring Provider: Godfrey Pick Tobb DO Height (inches): 62 Interpreting Physician: Shelva Majestic MD, ABSM Weight (lbs): 212 RPSGT: Gwenyth Allegra BMI: 39 MRN: [id number] Neck Size: 15.00  CLINICAL INFORMATION Sleep Study Type: NPSG  Indication for sleep study: daytime sleepiness, palpitations, obesity  Epworth Sleepiness Score: 14  SLEEP STUDY TECHNIQUE As per the AASM Manual for the Scoring of Sleep and Associated Events v2.3 (April 2016) with a hypopnea requiring 4% desaturations.  The channels recorded and monitored were frontal, central and occipital EEG, electrooculogram (EOG), submentalis EMG (chin), nasal and oral airflow, thoracic and abdominal wall motion, anterior tibialis EMG, snore microphone, electrocardiogram, and pulse oximetry.  MEDICATIONS albuterol (PROAIR HFA) 108 (90 Base) MCG/ACT inhaler atorvastatin (LIPITOR) 20 MG tablet b complex vitamins capsule brimonidine (ALPHAGAN) 0.2 % ophthalmic solution Budeson-Glycopyrrol-Formoterol (BREZTRI AEROSPHERE) 160-9-4.8 MCG/ACT AERO Cholecalciferol (VITAMIN D3) 75 MCG (3000 UT) TABS dorzolamide-timolol (COSOPT) 22.3-6.8 MG/ML ophthalmic solution DULoxetine (CYMBALTA) 30 MG capsule levocetirizine (XYZAL) 5 MG tablet levothyroxine (SYNTHROID) 25 MCG tablet propranolol (INDERAL) 20 MG tablet Medications self-administered by patient taken the night of the study : N/A  SLEEP ARCHITECTURE The study was initiated at 10:52:52 PM and ended at 4:54:28 AM.  Sleep onset time was 243.3 minutes and the sleep efficiency was 16.7%%. The total sleep time was 60.5 minutes.  Stage REM latency was N/A minutes.  The patient spent 6.6%% of the night in stage N1 sleep, 93.4%% in stage N2 sleep, 0.0%% in stage N3 and 0% in REM.  Alpha intrusion was absent.  Supine sleep was 89.26%.  RESPIRATORY PARAMETERS The overall  apnea/hypopnea index (AHI) was 0.0 per hour. There were 0 total apneas, including 0 obstructive, 0 central and 0 mixed apneas. There were 0 hypopneas and 1 RERAs.  The AHI during Stage REM sleep was N/A per hour.  AHI while supine was 0.0 per hour.  The mean oxygen saturation was 89.6%. The minimum SpO2 during sleep was 87.0%.  No snoring was noted during this study.  CARDIAC DATA The 2 lead EKG demonstrated sinus rhythm. The mean heart rate was 77.4 beats per minute. Other EKG findings include: None.  LEG MOVEMENT DATA The total PLMS were 0 with a resulting PLMS index of 0.0. Associated arousal with leg movement index was 0.0 .  IMPRESSIONS - Limited study due to very poor sleep efficiency at 16.7%.  However, no significant obstructive sleep apnea occurred during the limited sleep time of only 60.5 minutes with absent REM sleep. (AHI  0.0/h). - Mild oxygen desaturation to a nadir of 87%. - No snoring was audible during this study. - No cardiac abnormalities were noted during this study. - Clinically significant periodic limb movements did not occur during sleep. No significant associated arousals.  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 - Avoid alcohol, sedatives and other CNS depressants that may worsen sleep apnea and disrupt normal sleep architecture. - Sleep hygiene should be reviewed to assess factors that may improve sleep quality. - Weight management (BMI 39) and regular exercise should be initiated or continued if appropriate.  [Electronically signed] 05/10/2020 11:52 AM  Shelva Majestic MD, [facility], [place], American Board of Sleep Medicine   NPI: [id number] [place] PH: [phone]   FX: [phone]  [place] [facility]

## 2020-05-11 NOTE — Telephone Encounter (Signed)
Sleep study has been interpreted

## 2020-05-13 ENCOUNTER — Telehealth: Payer: Self-pay | Admitting: *Deleted

## 2020-05-13 NOTE — Telephone Encounter (Signed)
-----   Message from Troy Sine, MD sent at 05/10/2020 12:00 PM EDT ----- Mariann Laster please notify pt of the results

## 2020-05-13 NOTE — Telephone Encounter (Signed)
Patient notified sleep study inadequate. Will need to be redone with sleep aid. Patient voiced understanding.

## 2020-05-13 NOTE — Telephone Encounter (Signed)
Left message to return a call to discuss sleep study results and recommendations. 

## 2020-05-13 NOTE — Telephone Encounter (Signed)
-----   Message from Thomas A Kelly, MD sent at 05/10/2020 12:00 PM EDT ----- Jaise Moser please notify pt of the results 

## 2020-05-15 NOTE — Telephone Encounter (Signed)
Prior Authorization for split night sleep study sent to [facility] via web portal. Tracking Number [id number]. Initial sleep study was inadequate and has to be repeated per Dr Claiborne Billings with the use of a sleep aid.

## 2020-05-15 NOTE — Telephone Encounter (Signed)
Patient returned a call and was given NSPG results and recommendations.

## 2020-05-26 DIAGNOSIS — R32 Unspecified urinary incontinence: Secondary | ICD-10-CM | POA: Insufficient documentation

## 2020-05-26 DIAGNOSIS — K449 Diaphragmatic hernia without obstruction or gangrene: Secondary | ICD-10-CM | POA: Insufficient documentation

## 2020-05-26 DIAGNOSIS — E079 Disorder of thyroid, unspecified: Secondary | ICD-10-CM | POA: Insufficient documentation

## 2020-05-26 DIAGNOSIS — D259 Leiomyoma of uterus, unspecified: Secondary | ICD-10-CM | POA: Insufficient documentation

## 2020-05-26 DIAGNOSIS — K317 Polyp of stomach and duodenum: Secondary | ICD-10-CM | POA: Insufficient documentation

## 2020-05-26 DIAGNOSIS — K219 Gastro-esophageal reflux disease without esophagitis: Secondary | ICD-10-CM | POA: Insufficient documentation

## 2020-05-26 DIAGNOSIS — B029 Zoster without complications: Secondary | ICD-10-CM | POA: Insufficient documentation

## 2020-05-26 DIAGNOSIS — D649 Anemia, unspecified: Secondary | ICD-10-CM | POA: Insufficient documentation

## 2020-05-26 DIAGNOSIS — T7840XA Allergy, unspecified, initial encounter: Secondary | ICD-10-CM | POA: Insufficient documentation

## 2020-05-26 DIAGNOSIS — B019 Varicella without complication: Secondary | ICD-10-CM | POA: Insufficient documentation

## 2020-05-26 DIAGNOSIS — M1990 Unspecified osteoarthritis, unspecified site: Secondary | ICD-10-CM | POA: Insufficient documentation

## 2020-05-28 ENCOUNTER — Ambulatory Visit (INDEPENDENT_AMBULATORY_CARE_PROVIDER_SITE_OTHER): Payer: 59 | Admitting: Cardiology

## 2020-05-28 ENCOUNTER — Other Ambulatory Visit: Payer: Self-pay

## 2020-05-28 ENCOUNTER — Encounter: Payer: Self-pay | Admitting: Cardiology

## 2020-05-28 VITALS — BP 110/80 | HR 73 | Ht 62.0 in | Wt 227.0 lb

## 2020-05-28 DIAGNOSIS — I491 Atrial premature depolarization: Secondary | ICD-10-CM | POA: Diagnosis not present

## 2020-05-28 DIAGNOSIS — E785 Hyperlipidemia, unspecified: Secondary | ICD-10-CM | POA: Diagnosis not present

## 2020-05-28 DIAGNOSIS — I471 Supraventricular tachycardia: Secondary | ICD-10-CM | POA: Insufficient documentation

## 2020-05-28 DIAGNOSIS — I4719 Other supraventricular tachycardia: Secondary | ICD-10-CM

## 2020-05-28 NOTE — Patient Instructions (Signed)
Medication Instructions:  Your physician recommends that you continue on your current medications as directed. Please refer to the Current Medication list given to you today.  *If you need a refill on your cardiac medications before your next appointment, please call your pharmacy*   Lab Work: Your physician recommends that you return for lab work: 6 weeks: Lipids  If you have labs (blood work) drawn today and your tests are completely normal, you will receive your results only by: Marland Kitchen MyChart Message (if you have MyChart) OR . A paper copy in the mail If you have any lab test that is abnormal or we need to change your treatment, we will call you to review the results.   Testing/Procedures: None   Follow-Up: At [facility], you and your health needs are our priority.  As part of our continuing mission to provide you with exceptional heart care, we have created designated Provider Care Teams.  These Care Teams include your primary Cardiologist (physician) and Advanced Practice Providers (APPs -  Physician Assistants and Nurse Practitioners) who all work together to provide you with the care you need, when you need it.  We recommend signing up for the patient portal called "MyChart".  Sign up information is provided on this After Visit Summary.  MyChart is used to connect with patients for Virtual Visits (Telemedicine).  Patients are able to view lab/test results, encounter notes, upcoming appointments, etc.  Non-urgent messages can be sent to your provider as well.   To learn more about what you can do with MyChart, go to [email or web address].    Your next appointment:   6 month(s)  The format for your next appointment:   In Person  Provider:   Berniece Salines, DO   Other Instructions

## 2020-05-28 NOTE — Progress Notes (Signed)
Cardiology Office Note:    Date:  05/28/2020   ID:  Cathy Baxter, DOB 02-10-1960, MRN [id number]  PCP:  Ma Hillock, DO  Cardiologist:  None  Electrophysiologist:  None   Referring MD: Ma Hillock, DO   I am having some palpitation - but I don't trust taking medications  History of Present Illness:    Cathy Baxter is a 60 y.o. female with a hx of hypothyroidism, hiatal hernia, obesity, hyperlipidemia is here today for a follow-up visit.  Did see the patient initially on February 29, 2020 at that time she reported that she has been experiencing significant palpitations as well as shortness of breath.  She did admit to having concerns for daytime somnolence.  Conclusion her visit echocardiogram was ordered, as well as a ZIO monitor and a sleep study was also ordered.    In interim the patient was able to get this testing done and we have called the patient with her results.  Giving atrial tachycardia as well as rare symptomatic PACs on her monitor and the patient still reporting symptoms to recommend propanolol  unfortunately the patient did not start this medication.  She states is not relatively her medications.  There are some issues with Crestor we stopped that and Lipitor was recommended she has not started it yet.  Is an exercise bike and she is looking forward to exercising.   Past Medical History:  Diagnosis Date  . Allergy   . Anemia   . Arthritis   . Asthma   . Chicken pox   . GERD (gastroesophageal reflux disease)   . Hiatal hernia   . Pigmentary glaucoma of both eyes   . Polyp of stomach   . Shingles   . Thyroid disease   . Urinary incontinence   . Uterine fibroid   . Uterine fibroids in pregnancy, postpartum condition 11/06/2015    Past Surgical History:  Procedure Laterality Date  . CHOLECYSTECTOMY  2002  . EYE SURGERY    . KNEE ARTHROSCOPY Left 08/2015  . NASAL SINUS SURGERY  2000    Current Medications: Current Meds  Medication Sig  .  albuterol (PROAIR HFA) 108 (90 Base) MCG/ACT inhaler Inhale 2 puffs into the lungs every 6 (six) hours as needed for wheezing or shortness of breath.  Marland Kitchen atorvastatin (LIPITOR) 20 MG tablet Take 1 tablet (20 mg total) by mouth daily.  Marland Kitchen b complex vitamins capsule Take 1 capsule by mouth daily.  . brimonidine (ALPHAGAN) 0.2 % ophthalmic solution Place 1 drop into both eyes 2 (two) times daily.  . Budeson-Glycopyrrol-Formoterol (BREZTRI AEROSPHERE) 160-9-4.8 MCG/ACT AERO Inhale 2 puffs into the lungs in the morning and at bedtime. with spacer and rinse mouth afterwards.  . Cholecalciferol (VITAMIN D3) 75 MCG (3000 UT) TABS Take by mouth.  . dorzolamide-timolol (COSOPT) 22.3-6.8 MG/ML ophthalmic solution Place 1 drop into both eyes 2 (two) times daily.   . DULoxetine (CYMBALTA) 30 MG capsule Take 1 capsule (30 mg total) by mouth daily.  Marland Kitchen levocetirizine (XYZAL) 5 MG tablet Take 1 tablet (5 mg total) by mouth every evening.  Marland Kitchen levothyroxine (SYNTHROID) 25 MCG tablet Take 1 tablet (25 mcg total) by mouth daily before breakfast.  . propranolol (INDERAL) 20 MG tablet Take 1 tablet (20 mg total) by mouth at bedtime.     Allergies:   Azithromycin, Percocet [oxycodone-acetaminophen], Amoxicillin, Doxycycline, Oxycodone, Prednisone, and Demerol [meperidine]   Social History   Socioeconomic History  . Marital status: Divorced  Spouse name: Not on file  . Number of children: Not on file  . Years of education: 15  . Highest education level: Not on file  Occupational History  . Occupation: stock  Tobacco Use  . Smoking status: Former Smoker    Packs/day: 3.00    Years: 12.00    Pack years: 36.00    Types: Cigarettes    Quit date: 07/12/1983    Years since quitting: 36.9  . Smokeless tobacco: Never Used  Vaping Use  . Vaping Use: Never used  Substance and Sexual Activity  . Alcohol use: Not Currently    Comment: once every 6 months   . Drug use: No  . Sexual activity: Not on file  Other  Topics Concern  . Not on file  Social History Narrative   Divorced. She has had 3 children, one passed away young from cancer.    11th grade education. Works at [employer].    Former smoker.   Drinks caffeine.    Wears seatbelt, smoke detector in the home.    Firearms in the home.    Feels safe in her relationships.    Social Determinants of Health   Financial Resource Strain: Not on file  Food Insecurity: Not on file  Transportation Needs: Not on file  Physical Activity: Not on file  Stress: Not on file  Social Connections: Not on file     Family History: The patient's family history includes Asthma in her mother; Breast cancer (age of onset: 35) in her sister; COPD in her mother; Cancer (age of onset: 49) in her son; Celiac disease in her daughter; Early death in her father; Heart disease in her mother. There is no history of Allergic rhinitis, Eczema, Urticaria, Angioedema, Immunodeficiency, or Atopy.  ROS:   Review of Systems  Constitution: Negative for decreased appetite, fever and weight gain.  HENT: Negative for congestion, ear discharge, hoarse voice and sore throat.   Eyes: Negative for discharge, redness, vision loss in right eye and visual halos.  Cardiovascular: Negative for chest pain, dyspnea on exertion, leg swelling, orthopnea and palpitations.  Respiratory: Negative for cough, hemoptysis, shortness of breath and snoring.   Endocrine: Negative for heat intolerance and polyphagia.  Hematologic/Lymphatic: Negative for bleeding problem. Does not bruise/bleed easily.  Skin: Negative for flushing, nail changes, rash and suspicious lesions.  Musculoskeletal: Negative for arthritis, joint pain, muscle cramps, myalgias, neck pain and stiffness.  Gastrointestinal: Negative for abdominal pain, bowel incontinence, diarrhea and excessive appetite.  Genitourinary: Negative for decreased libido, genital sores and incomplete emptying.  Neurological: Negative for brief paralysis,  focal weakness, headaches and loss of balance.  Psychiatric/Behavioral: Negative for altered mental status, depression and suicidal ideas.  Allergic/Immunologic: Negative for HIV exposure and persistent infections.    EKGs/Labs/Other Studies Reviewed:    The following studies were reviewed today:   EKG: Today  ZIO monitor The patient wore the monitor for 7 days starting February 29, 2020. Indication: Palpitations  The minimum heart rate was 53 bpm, maximum heart rate was 200 bpm, and average heart rate was 77 bpm. Predominant underlying rhythm was Sinus Rhythm.  4 Supraventricular Tachycardia runs occurred, the run with the fastest interval lasting 9 beats with a max rate of 200 bpm (average 176 bpm); the run with the fastest interval was also the longest.   Premature atrial complexes were rare less than 1%. Premature Ventricular complexes were rare less than 1%.  No ventricular tachycardia, no pauses, No AV block and no  atrial fibrillation present. 10 patient triggered events 1 associated with supraventricular tachycardia and the remaining associated with premature atrial complex in sinus rhythm.  6 diary events are associated with premature atrial complexes sinus rhythm  Conclusion: This study is remarkable for the following:                                        1.  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                                         2.  Rare symptomatic premature atrial complexes.   Transthoracic echocardiogram March 26, 2020 IMPRESSIONS  1. GLS: -21.9. Left ventricular ejection fraction, by estimation, is 60 to 65%. The left ventricle has normal function. The left ventricle has no regional wall motion abnormalities. Left ventricular diastolic parameters were normal.  2. Right ventricular systolic function is normal. The right ventricular size is normal. There is normal pulmonary artery systolic pressure.  3. The mitral  valve is normal in structure. No evidence of mitral valve regurgitation. No evidence of mitral stenosis.  4. The aortic valve is normal in structure. Aortic valve regurgitation is not visualized. No aortic stenosis is present.  5. The inferior vena cava is normal in size with greater than 50% respiratory variability, suggesting right atrial pressure of 3 mmHg.   FINDINGS  Left Ventricle: GLS: -21.9. Left ventricular ejection fraction, by estimation, is 60 to 65%. The left ventricle has normal function. The left ventricle has no regional wall motion abnormalities. The left ventricular internal cavity size was normal in size. There is no left ventricular hypertrophy. Left ventricular diastolic parameters were normal.   Right Ventricle: The right ventricular size is normal. No increase in right ventricular wall thickness. Right ventricular systolic function is normal. There is normal pulmonary artery systolic pressure. The tricuspid regurgitant velocity is 2.71 m/s, and with an assumed right atrial pressure of 3 mmHg, the estimated right ventricular systolic pressure is 25.8 mmHg.   Left Atrium: Left atrial size was normal in size.   Right Atrium: Right atrial size was normal in size.   Pericardium: There is no evidence of pericardial effusion.   Mitral Valve: The mitral valve is normal in structure. No evidence of mitral valve regurgitation. No evidence of mitral valve stenosis.   Tricuspid Valve: The tricuspid valve is normal in structure. Tricuspid valve regurgitation is mild . No evidence of tricuspid stenosis.   Aortic Valve: The aortic valve is normal in structure. Aortic valve regurgitation is not visualized. No aortic stenosis is present.   Pulmonic Valve: The pulmonic valve was normal in structure. Pulmonic valve  regurgitation is not visualized. No evidence of pulmonic stenosis.   Aorta: The aortic root is normal in size and structure.   Venous: The inferior vena cava is normal  in size with greater than 50% respiratory variability, suggesting right atrial pressure of 3 mmHg.   IAS/Shunts: No atrial level shunt detected by color flow Doppler.   Recent Labs: 12/12/2019: Hemoglobin 13.7; Platelets 364.0; TSH 2.88 04/01/2020: ALT 42; BUN 17; Creatinine, Ser 0.69; Potassium 4.3; Sodium 139  Recent Lipid Panel    Component Value Date/Time   CHOL 247 (H) 03/26/2020 1305   TRIG 96 03/26/2020 1305   HDL 63  03/26/2020 1305   CHOLHDL 3.9 03/26/2020 1305   LDLCALC 167 (H) 03/26/2020 1305    Physical Exam:    VS:  BP 110/80   Pulse 73   Ht 5\' 2"  (1.575 m)   Wt 227 lb (103 kg)   SpO2 96%   BMI 41.52 kg/m     Wt Readings from Last 3 Encounters:  05/28/20 227 lb (103 kg)  05/08/20 226 lb (102.5 kg)  04/20/20 212 lb (96.2 kg)     GEN: Well nourished, well developed in no acute distress HEENT: Normal NECK: No JVD; No carotid bruits LYMPHATICS: No lymphadenopathy CARDIAC: S1S2 noted,RRR, no murmurs, rubs, gallops RESPIRATORY:  Clear to auscultation without rales, wheezing or rhonchi  ABDOMEN: Soft, non-tender, non-distended, +bowel sounds, no guarding. EXTREMITIES: No edema, No cyanosis, no clubbing MUSCULOSKELETAL:  No deformity  SKIN: Warm and dry NEUROLOGIC:  Alert and oriented x 3, non-focal PSYCHIATRIC:  Normal affect, good insight  ASSESSMENT:    1. Hyperlipidemia, unspecified hyperlipidemia type   2. PAT (paroxysmal atrial tachycardia) ([place])   3. PAC (premature atrial contraction)   4. Morbid obesity ([place])    PLAN:     Talked about her testing results.  The patient had multiple questions about the ZIO monitor as well as echocardiogram.  She has not started taking propranolol as she does not want to right now prescribed patient that if she is not experiencing symptoms that she can hold off on this for now.  There is evidence of hyperlipidemia she is holding off for now to start on Lipitor she will infesting diet modification for now.  She will let  me know when she started Lipitor and we will be able to get blood work 6 weeks out from the day she started.  The patient understands the need to lose weight with diet and exercise. We have discussed specific strategies for this.  The patient is in agreement with the above plan. The patient left the office in stable condition.  The patient will follow up in 6 months   Medication Adjustments/Labs and Tests Ordered: Current medicines are reviewed at length with the patient today.  Concerns regarding medicines are outlined above.  No orders of the defined types were placed in this encounter.  No orders of the defined types were placed in this encounter.   Patient Instructions  Medication Instructions:  Your physician recommends that you continue on your current medications as directed. Please refer to the Current Medication list given to you today.  *If you need a refill on your cardiac medications before your next appointment, please call your pharmacy*   Lab Work: Your physician recommends that you return for lab work: 6 weeks: Lipids  If you have labs (blood work) drawn today and your tests are completely normal, you will receive your results only by: Marland Kitchen MyChart Message (if you have MyChart) OR . A paper copy in the mail If you have any lab test that is abnormal or we need to change your treatment, we will call you to review the results.   Testing/Procedures: None   Follow-Up: At [facility], you and your health needs are our priority.  As part of our continuing mission to provide you with exceptional heart care, we have created designated Provider Care Teams.  These Care Teams include your primary Cardiologist (physician) and Advanced Practice Providers (APPs -  Physician Assistants and Nurse Practitioners) who all work together to provide you with the care you need, when you need  it.  We recommend signing up for the patient portal called "MyChart".  Sign up information is  provided on this After Visit Summary.  MyChart is used to connect with patients for Virtual Visits (Telemedicine).  Patients are able to view lab/test results, encounter notes, upcoming appointments, etc.  Non-urgent messages can be sent to your provider as well.   To learn more about what you can do with MyChart, go to [email or web address].    Your next appointment:   6 month(s)  The format for your next appointment:   In Person  Provider:   Berniece Salines, DO   Other Instructions      Adopting a Healthy Lifestyle.  Know what a healthy weight is for you (roughly BMI <25) and aim to maintain this   Aim for 7+ servings of fruits and vegetables daily   65-80+ fluid ounces of water or unsweet tea for healthy kidneys   Limit to max 1 drink of alcohol per day; avoid smoking/tobacco   Limit animal fats in diet for cholesterol and heart health - choose grass fed whenever available   Avoid highly processed foods, and foods high in saturated/trans fats   Aim for low stress - take time to unwind and care for your mental health   Aim for 150 min of moderate intensity exercise weekly for heart health, and weights twice weekly for bone health   Aim for 7-9 hours of sleep daily   When it comes to diets, agreement about the perfect plan isnt easy to find, even among the experts. Experts at the [employer] developed an idea known as the Healthy Eating Plate. Just imagine a plate divided into logical, healthy portions.   The emphasis is on diet quality:   Load up on vegetables and fruits - one-half of your plate: Aim for color and variety, and remember that potatoes dont count.   Go for whole grains - one-quarter of your plate: Whole wheat, barley, wheat berries, quinoa, oats, brown rice, and foods made with them. If you want pasta, go with whole wheat pasta.   Protein power - one-quarter of your plate: Fish, chicken, beans, and nuts are all healthy, versatile  protein sources. Limit red meat.   The diet, however, does go beyond the plate, offering a few other suggestions.   Use healthy plant oils, such as olive, canola, soy, corn, sunflower and peanut. Check the labels, and avoid partially hydrogenated oil, which have unhealthy trans fats.   If youre thirsty, drink water. Coffee and tea are good in moderation, but skip sugary drinks and limit milk and dairy products to one or two daily servings.   The type of carbohydrate in the diet is more important than the amount. Some sources of carbohydrates, such as vegetables, fruits, whole grains, and beans-are healthier than others.   Finally, stay active  Signed, Berniece Salines, DO  05/28/2020 3:43 PM    [facility]

## 2020-06-06 NOTE — Telephone Encounter (Signed)
Dr Claiborne Billings notified repeat sleep study has been denied by [facility]. Appeal is required as next step. He was given information for the appeal process on 05/26/20.  It was returned to me on 06/06/20 with a message per Veronda Prude, LPN  he states that he will address this on his next day in the office.

## 2020-07-03 ENCOUNTER — Telehealth: Payer: Self-pay | Admitting: Allergy

## 2020-07-03 NOTE — Telephone Encounter (Signed)
Please place referral to ENT - Dr. Benjamine Mola or [facility] for chronic rhinitis s/p sinus surgery.   Thank you.

## 2020-07-03 NOTE — Telephone Encounter (Signed)
Patient was seen 05/08/20. She said at the appointment, she was told she would be referred to an ENT. She hasn't heard back from [place] and would like to know the status of this referral. I do not see a referral for her. Would you like one placed and if so, what is the diagnosis?

## 2020-07-03 NOTE — Telephone Encounter (Signed)
Not sure what s/p means and will reach out to Dr. Maudie Mercury for understanding.

## 2020-07-03 NOTE — Telephone Encounter (Signed)
S/p stands for status post.  You can write dx: chronic rhinitis, history of sinus surgery instead.

## 2020-07-04 NOTE — Telephone Encounter (Signed)
Please place referral to ENT - Dr. Benjamine Mola or [facility] for chronic rhinitis, history of sinus surgery. Thank you

## 2020-07-15 ENCOUNTER — Ambulatory Visit: Payer: 59 | Admitting: Allergy

## 2020-07-15 NOTE — Progress Notes (Deleted)
Follow Up Note  RE: Cathy Baxter MRN: [id number] DOB: Aug 05, 1960 Date of Office Visit: 07/15/2020  Referring provider: Ma Hillock, DO Primary care provider: Ma Hillock, DO  Chief Complaint: No chief complaint on file.  History of Present Illness: I had the pleasure of seeing Cathy Baxter for a follow up visit at the [facility] [place] [facility] [place] on 07/15/2020. She is a 60 y.o. female, who is being followed for asthma, allergic rhinitis. Her previous allergy office visit was on 05/08/2020 with Dr. Maudie Mercury. Today is a regular follow up visit.  Asthma Issues with her breathing for 20+ years.  Currently on Advair 250 mcg 1 puff twice a day x1 year and albuterol as needed with good benefit.  Cathy Baxter in the past.  Using albuterol less than once per week.  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  Takes Nexium as needed for reflux.  Seen cardiology as well. 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 If no improvement with below regimen, will discuss starting biologics.  Spirometry today showed some restriction with 11% improvement in FEV1 post bronchodilator treatment.  Clinically feeling improved. Daily controller medication(s): Stop Advair for now. Start Breztri 149mcg 2 puffs twice a day with spacer and rinse mouth afterwards. Samples give and demonstrated proper use. Spacer given and demonstrated proper use with inhaler. Patient understood technique and all questions/concerned were addressed. May use albuterol rescue inhaler 2 puffs every 4 to 6 hours as needed for shortness of breath, chest tightness, coughing, and wheezing. May use albuterol rescue inhaler 2 puffs 5 to 15 minutes prior to strenuous physical activities. Monitor frequency of use.  Repeat spirometry at next visit.   Other allergic rhinitis Perennial rhinitis symptoms with no specific triggers.  Tried Zyrtec, Allegra and Xyzal with some benefit.  Stopped Flonase  due to glaucoma.  Skin testing in 2014 was positive to dust mites, cat, dog, horse, feathers, grass, trees, pigweed, mold.  She was on allergy immunotherapy for less than 1 year.  She also had sinus surgery in 2000.  No pets at home. Today's skin testing showed: Positive to cats, dogs.  Negative to common foods. Start environmental control measures as below. May use over the counter antihistamines such as Xyzal (levocetirizine) daily as needed. Nasal saline spray (i.e., Simply Saline) or nasal saline lavage (i.e., NeilMed) is recommended as needed and prior to medicated nasal sprays. 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 Recommend ENT evaluation next due to prior sinus surgeries as above allergens do not support her symptoms.     Return in about 2 months (around 07/08/2020).  Assessment and Plan: Cathy Baxter is a 60 y.o. female with: No problem-specific Assessment & Plan notes found for this encounter.  No follow-ups on file.  No orders of the defined types were placed in this encounter.  Lab Orders  No laboratory test(s) ordered today    Diagnostics: Spirometry:  Tracings reviewed. Her effort: {Blank single:19197::"Good reproducible efforts.","It was hard to get consistent efforts and there is a question as to whether this reflects a maximal maneuver.","Poor effort, data can not be interpreted."} FVC: ***L FEV1: ***L, ***% predicted FEV1/FVC ratio: ***% Interpretation: {Blank single:19197::"Spirometry consistent with mild obstructive disease","Spirometry consistent with moderate obstructive disease","Spirometry consistent with severe obstructive disease","Spirometry consistent with possible restrictive disease","Spirometry consistent with mixed obstructive and restrictive disease","Spirometry uninterpretable due to technique","Spirometry consistent with normal pattern","No overt abnormalities noted given today's efforts"}.  Please see scanned spirometry  results for details.  Skin  Testing: {Blank single:19197::"Select foods","Environmental allergy panel","Environmental allergy panel and select foods","Food allergy panel","None","Deferred due to recent antihistamines use"}. Positive test to: ***. Negative test to: ***.  Results discussed with patient/family.   Medication List:  Current Outpatient Medications  Medication Sig Dispense Refill   albuterol (PROAIR HFA) 108 (90 Base) MCG/ACT inhaler Inhale 2 puffs into the lungs every 6 (six) hours as needed for wheezing or shortness of breath. 1 each 0   atorvastatin (LIPITOR) 20 MG tablet Take 1 tablet (20 mg total) by mouth daily. 90 tablet 3   b complex vitamins capsule Take 1 capsule by mouth daily.     brimonidine (ALPHAGAN) 0.2 % ophthalmic solution Place 1 drop into both eyes 2 (two) times daily.     Budeson-Glycopyrrol-Formoterol (BREZTRI AEROSPHERE) 160-9-4.8 MCG/ACT AERO Inhale 2 puffs into the lungs in the morning and at bedtime. with spacer and rinse mouth afterwards. 10.7 g 3   Cholecalciferol (VITAMIN D3) 75 MCG (3000 UT) TABS Take by mouth.     dorzolamide-timolol (COSOPT) 22.3-6.8 MG/ML ophthalmic solution Place 1 drop into both eyes 2 (two) times daily.      DULoxetine (CYMBALTA) 30 MG capsule Take 1 capsule (30 mg total) by mouth daily. 90 capsule 1   levocetirizine (XYZAL) 5 MG tablet Take 1 tablet (5 mg total) by mouth every evening. 30 tablet 5   levothyroxine (SYNTHROID) 25 MCG tablet Take 1 tablet (25 mcg total) by mouth daily before breakfast. 90 tablet 3   propranolol (INDERAL) 20 MG tablet Take 1 tablet (20 mg total) by mouth at bedtime. 90 tablet 3   No current facility-administered medications for this visit.   Allergies: Allergies  Allergen Reactions   Azithromycin Palpitations    Arrhythmia side effects   Percocet [Oxycodone-Acetaminophen] Shortness Of Breath   Amoxicillin Hives    Has patient had a PCN reaction causing immediate rash, facial/tongue/throat swelling, SOB or lightheadedness  with hypotension: Yes Has patient had a PCN reaction causing severe rash involving mucus membranes or skin necrosis: No Has patient had a PCN reaction that required hospitalization No Has patient had a PCN reaction occurring within the last 10 years: Yes  If all of the above answers are "NO", then may proceed with Cephalosporin use.    Doxycycline Itching   Oxycodone    Prednisone     Caution>> severe glaucoma.    Demerol [Meperidine] Palpitations   I reviewed her past medical history, social history, family history, and environmental history and no significant changes have been reported from her previous visit.  Review of Systems  Constitutional:  Negative for appetite change, chills, fever and unexpected weight change.  HENT:  Positive for congestion and sinus pressure. Negative for rhinorrhea.   Eyes:  Negative for itching.  Respiratory:  Positive for cough, chest tightness, shortness of breath and wheezing.   Cardiovascular:  Negative for chest pain.  Gastrointestinal:  Negative for abdominal pain.  Genitourinary:  Negative for difficulty urinating.  Skin:  Negative for rash.  Allergic/Immunologic: Positive for environmental allergies.  Neurological:  Positive for headaches.   Objective: There were no vitals taken for this visit. There is no height or weight on file to calculate BMI. Physical Exam Vitals and nursing note reviewed.  Constitutional:      Appearance: Normal appearance. She is well-developed.  HENT:     Head: Normocephalic and atraumatic.     Right Ear: External ear normal.     Left Ear: External ear  normal.     Nose: Nose normal.     Mouth/Throat:     Mouth: Mucous membranes are moist.     Pharynx: Oropharynx is clear.  Eyes:     Conjunctiva/sclera: Conjunctivae normal.  Cardiovascular:     Rate and Rhythm: Normal rate and regular rhythm.     Heart sounds: Normal heart sounds. No murmur heard.   No friction rub. No gallop.  Pulmonary:     Effort:  Pulmonary effort is normal.     Breath sounds: Normal breath sounds. No wheezing, rhonchi or rales.  Abdominal:     Palpations: Abdomen is soft.  Musculoskeletal:     Cervical back: Neck supple.  Skin:    General: Skin is warm.     Findings: No rash.  Neurological:     Mental Status: She is alert and oriented to person, place, and time.  Psychiatric:        Behavior: Behavior normal.   Previous notes and tests were reviewed. The plan was reviewed with the patient/family, and all questions/concerned were addressed.  It was my pleasure to see Cathy Baxter today and participate in her care. Please feel free to contact me with any questions or concerns.  Sincerely,  Rexene Alberts, DO Allergy & Immunology  [facility] office: [place] office: [phone]

## 2020-07-17 ENCOUNTER — Telehealth: Payer: Self-pay

## 2020-07-17 NOTE — Telephone Encounter (Signed)
Please assist patient with scheduling provider appt, thanks.

## 2020-07-17 NOTE — Telephone Encounter (Signed)
Patient requesting referral to endocrunologist for thyroid issues.  Patient states that Dr. Raoul Pitch has treated her for issues and is well aware of her condition.  Patient can be reached at [phone]

## 2020-07-18 NOTE — Telephone Encounter (Signed)
Patient is current patient of  endocrinologist

## 2020-08-14 LAB — RESULTS CONSOLE HPV
CHL HPV: NEGATIVE
CHL HPV: NEGATIVE

## 2020-08-14 LAB — HM PAP SMEAR
HM Pap smear: NORMAL
HPV, high-risk: NEGATIVE

## 2020-08-14 LAB — HM DEXA SCAN: HM Dexa Scan: NORMAL

## 2020-08-14 LAB — HM MAMMOGRAPHY

## 2020-08-19 LAB — HM MAMMOGRAPHY

## 2020-08-21 ENCOUNTER — Ambulatory Visit: Payer: 59 | Admitting: Allergy

## 2020-08-21 ENCOUNTER — Encounter: Payer: Self-pay | Admitting: Allergy

## 2020-08-21 ENCOUNTER — Other Ambulatory Visit: Payer: Self-pay

## 2020-08-21 VITALS — BP 124/78 | HR 68 | Temp 98.3°F | Resp 18

## 2020-08-21 DIAGNOSIS — J3089 Other allergic rhinitis: Secondary | ICD-10-CM

## 2020-08-21 DIAGNOSIS — J45909 Unspecified asthma, uncomplicated: Secondary | ICD-10-CM

## 2020-08-21 NOTE — Patient Instructions (Addendum)
Asthma: Daily controller medication(s): Stop Advair for now.  Start Breztri 150mg 2 puffs twice a day with spacer and rinse mouth afterwards. Demonstrated proper use with inhaler. Patient understood technique and all questions/concerned were addressed.  May use albuterol rescue inhaler 2 puffs every 4 to 6 hours as needed for shortness of breath, chest tightness, coughing, and wheezing. May use albuterol rescue inhaler 2 puffs 5 to 15 minutes prior to strenuous physical activities. Monitor frequency of use.  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  Continue environmental control measures as below. Try to take Xyzal (levocetirizine) 1/2 tablet at night. If it still makes you still drowsy then try: Over the counter antihistamines such as Claritin (loratadine), Allegra (fexofenadine), or Zyrtec (cetirizine) daily as needed. Nasal saline spray (i.e., Simply Saline) or nasal saline lavage (i.e., NeilMed) is recommended as needed and prior to medicated nasal sprays. 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  Recommend ENT evaluation next due to prior sinus surgeries - Dr. TBenjamine Mola   Follow up in 2 months or sooner if needed.   Follow up with PCP regarding your other issues.   Pet Allergen Avoidance: Contrary to popular opinion, there are no "hypoallergenic" breeds of dogs or cats. That is because people are not allergic to an animal's hair, but to an allergen found in the animal's saliva, dander (dead skin flakes) or urine. Pet allergy symptoms typically occur within minutes. For some people, symptoms can build up and become most severe 8 to 12 hours after  contact with the animal. People with severe allergies can experience reactions in public places if dander has been transported on the pet owners' clothing. Keeping an animal outdoors is only a partial solution, since homes with pets in the yard still have higher concentrations of animal allergens. Before getting a pet, ask your allergist to determine if you are allergic to animals. If your pet is already considered part of your family, try to minimize contact and keep the pet out of the bedroom and other rooms where you spend a great deal of time. As with dust mites, vacuum carpets often or replace carpet with a hardwood floor, tile or linoleum. High-efficiency particulate air (HEPA) cleaners can reduce allergen levels over time. While dander and saliva are the source of cat and dog allergens, urine is the source of allergens from rabbits, hamsters, mice and g[place]igs; so ask a non-allergic family member to clean the animal's cage. If you have a pet allergy, talk to your allergist about the potential for allergy immunotherapy (allergy shots). This strategy can often provide long-term relief.

## 2020-08-21 NOTE — Assessment & Plan Note (Signed)
Past history - Issues with her breathing for 20+ years.  Currently on Advair 250 mcg 1 puff twice a day x1 year and albuterol as needed with good benefit.  [name] in the past.  Using albuterol less than once per week.  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  Takes Nexium as needed for reflux.  Seen cardiology as well. Interim history - did not start Breztri and still on Advair. Sometimes has wheezing and shortness of breath.   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   If no improvement with below regimen, will discuss starting biologics.   Spirometry today showed some restriction.  ACT score 17. . Daily controller medication(s): Stop Advair for now.  o Start Breztri 16mg 2 puffs twice a day with spacer and rinse mouth afterwards. o Demonstrated proper use with inhaler. Patient understood technique and all questions/concerned were addressed.  . May use albuterol rescue inhaler 2 puffs every 4 to 6 hours as needed for shortness of breath, chest tightness, coughing, and wheezing. May use albuterol rescue inhaler 2 puffs 5 to 15 minutes prior to strenuous physical activities. Monitor frequency of use.   Get spirometry at next visit.

## 2020-08-21 NOTE — Assessment & Plan Note (Signed)
Past history - Perennial rhinitis symptoms with no specific triggers. 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  Skin testing in 2014 was positive to dust mites, cat, dog, horse, feathers, grass, trees, pigweed, mold.  She was on allergy immunotherapy for less than 1 year.  She also had sinus surgery in 2000. 2022 skin testing showed: Positive to cats, dogs.  Negative to common foods. No pets at home.   Interim history - unchanged symptoms. Xyzal caused sleepiness. Did not see ENT yet.  Continue environmental control measures as below.  Try to take Xyzal (levocetirizine) '5mg'$  1/2 tablet at night.  If it still makes you still drowsy then try:  Over the counter antihistamines such as Claritin (loratadine), Allegra (fexofenadine), or Zyrtec (cetirizine) daily as needed.  Nasal saline spray (i.e., Simply Saline) or nasal saline lavage (i.e., NeilMed) is recommended as needed and prior to medicated nasal sprays.  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   Recommend ENT evaluation next due to prior sinus surgeries - Dr. Benjamine Mola.

## 2020-08-21 NOTE — Progress Notes (Signed)
Follow Up Note  RE: Cathy Baxter MRN: [id number] DOB: 1961-01-03 Date of Office Visit: 08/21/2020  Referring provider: Ma Hillock, DO Primary care provider: Ma Hillock, DO  Chief Complaint: follow up  History of Present Illness: I had the pleasure of seeing Cathy Baxter for a follow up visit at the [facility] [place] [facility] [place] on 08/21/2020. She is a 60 y.o. female, who is being followed for asthma, allergic rhinitis. Her previous allergy office visit was on 05/08/2020 with Dr. Maudie Mercury. Today is a regular follow up visit.  Asthma ACT score 17. Currently on Advair 258mg 1 puff twice a day with rinsing mouth after each use. Never started [place] she had a lot of family and personal issues come up.   Sometimes she has wheezing and shortness of breath. She thinks shortness of breath is due to physical deconditioning.  Not sure if the wheezing is due to her sinus congestion or not.  No albuterol use recently.  Denies any ER/urgent care visits or prednisone use since the last visit.  Other allergic rhinitis Took Xyzal which caused sleepiness.  She still feels nasal congestion. She has plans to see ENT.   Patient also having decreased appetite but keeps gaining weight. Apparently her thyroid medication was adjusted. She also had some issues with vaginal bleeding and following with gynecology for that.    Assessment and Plan: Cathy Baxter a 60y.o. female with: Asthma Past history - Issues with her breathing for 20+ years.  Currently on Advair 250 mcg 1 puff twice a day x1 year and albuterol as needed with good benefit.  Cathy Baxter the past.  Using albuterol less than once per week.  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  Takes Nexium as needed for reflux.  Seen cardiology as well. Interim history - did not start Breztri and still on Advair. Sometimes has wheezing and shortness of breath.  Goal is to minimize flares and minimize systemic steroids due to  severe glaucoma.  If no improvement with below regimen, will discuss starting biologics.  Spirometry today showed some restriction. ACT score 17. Daily controller medication(s): Stop Advair for now.  Start Breztri 1661m 2 puffs twice a day with spacer and rinse mouth afterwards. Demonstrated proper use with inhaler. Patient understood technique and all questions/concerned were addressed.  May use albuterol rescue inhaler 2 puffs every 4 to 6 hours as needed for shortness of breath, chest tightness, coughing, and wheezing. May use albuterol rescue inhaler 2 puffs 5 to 15 minutes prior to strenuous physical activities. Monitor frequency of use.  Get spirometry at next visit.  Other allergic rhinitis Past history - Perennial rhinitis symptoms with no specific triggers. 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  Skin testing in 2014 was positive to dust mites, cat, dog, horse, feathers, grass, trees, pigweed, mold.  She was on allergy immunotherapy for less than 1 year.  She also had sinus surgery in 2000. 2022 skin testing showed: Positive to cats, dogs.  Negative to common foods. No pets at home.   Interim history - unchanged symptoms. Xyzal caused sleepiness. Did not see ENT yet. Continue environmental control measures as below. Try to take Xyzal (levocetirizine) '5mg'$  1/2 tablet at night. If it still makes you still drowsy then try: Over the counter antihistamines such as Claritin (loratadine), Allegra (fexofenadine), or Zyrtec (cetirizine) daily as needed. Nasal saline spray (i.e., Simply Saline) or nasal saline lavage (i.e., NeilMed) is recommended as needed and prior to  medicated nasal sprays. 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  Recommend ENT evaluation next due to prior sinus surgeries - Dr. Benjamine Mola.   Return in about 2 months (around 10/21/2020).  No orders of the defined types were placed in this encounter.  Lab Orders  No laboratory test(s) ordered today     Diagnostics: Spirometry:  Tracings reviewed. Her effort: Good reproducible efforts. FVC: 2.33L FEV1: 1.77L, 75% predicted FEV1/FVC ratio: 76% Interpretation: Spirometry consistent with possible restrictive disease.  Please see scanned spirometry results for details.  Medication List:  Current Outpatient Medications  Medication Sig Dispense Refill   albuterol (PROAIR HFA) 108 (90 Base) MCG/ACT inhaler Inhale 2 puffs into the lungs every 6 (six) hours as needed for wheezing or shortness of breath. 1 each 0   atorvastatin (LIPITOR) 20 MG tablet atorvastatin 20 mg tablet   1 tablet every day by oral route.     b complex vitamins capsule Take 1 capsule by mouth daily.     brimonidine (ALPHAGAN) 0.2 % ophthalmic solution Place 1 drop into both eyes 2 (two) times daily.     Budeson-Glycopyrrol-Formoterol (BREZTRI AEROSPHERE) 160-9-4.8 MCG/ACT AERO Inhale 2 puffs into the lungs in the morning and at bedtime. with spacer and rinse mouth afterwards. 10.7 g 3   Cholecalciferol (VITAMIN D3) 75 MCG (3000 UT) TABS Take by mouth.     dorzolamide-timolol (COSOPT) 22.3-6.8 MG/ML ophthalmic solution Place 1 drop into both eyes 2 (two) times daily.      DULoxetine (CYMBALTA) 30 MG capsule Take 1 capsule (30 mg total) by mouth daily. 90 capsule 1   levocetirizine (XYZAL) 5 MG tablet Take 1 tablet (5 mg total) by mouth every evening. 30 tablet 5   levothyroxine (SYNTHROID) 25 MCG tablet Take 1 tablet (25 mcg total) by mouth daily before breakfast. 90 tablet 3   propranolol (INDERAL) 20 MG tablet Take 1 tablet (20 mg total) by mouth at bedtime. 90 tablet 3   atorvastatin (LIPITOR) 20 MG tablet Take 1 tablet (20 mg total) by mouth daily. 90 tablet 3   No current facility-administered medications for this visit.   Allergies: Allergies  Allergen Reactions   Azithromycin Palpitations    Arrhythmia side effects   Percocet [Oxycodone-Acetaminophen] Shortness Of Breath   Amoxicillin Hives    Has patient  had a PCN reaction causing immediate rash, facial/tongue/throat swelling, SOB or lightheadedness with hypotension: Yes Has patient had a PCN reaction causing severe rash involving mucus membranes or skin necrosis: No Has patient had a PCN reaction that required hospitalization No Has patient had a PCN reaction occurring within the last 10 years: Yes  If all of the above answers are "NO", then may proceed with Cephalosporin use.    Doxycycline Itching   Oxycodone    Prednisone     Caution>> severe glaucoma.    Demerol [Meperidine] Palpitations   I reviewed her past medical history, social history, family history, and environmental history and no significant changes have been reported from her previous visit.  Review of Systems  Constitutional:  Positive for appetite change (loss of appetite). Negative for chills, fever and unexpected weight change.  HENT:  Positive for congestion and sinus pressure. Negative for rhinorrhea.   Eyes:  Negative for itching.  Respiratory:  Positive for shortness of breath and wheezing. Negative for cough and chest tightness.   Cardiovascular:  Negative for chest pain.  Gastrointestinal:  Negative for abdominal pain.  Genitourinary:  Negative for difficulty urinating.  Skin:  Negative for rash.  Allergic/Immunologic: Positive for environmental allergies.  Neurological:  Positive for headaches.   Objective: BP 124/78 (BP Location: Left Arm, Patient Position: Sitting, Cuff Size: Large)   Pulse 68   Temp 98.3 F (36.8 C) (Temporal)   Resp 18   SpO2 96%  There is no height or weight on file to calculate BMI. Physical Exam Vitals and nursing note reviewed.  Constitutional:      Appearance: Normal appearance. She is well-developed.  HENT:     Head: Normocephalic and atraumatic.     Right Ear: Tympanic membrane and external ear normal.     Left Ear: Tympanic membrane and external ear normal.     Nose: Nose normal.     Mouth/Throat:     Mouth: Mucous  membranes are moist.     Pharynx: Oropharynx is clear.  Eyes:     Conjunctiva/sclera: Conjunctivae normal.  Cardiovascular:     Rate and Rhythm: Normal rate and regular rhythm.     Heart sounds: Normal heart sounds. No murmur heard.   No friction rub. No gallop.  Pulmonary:     Effort: Pulmonary effort is normal.     Breath sounds: Normal breath sounds. No wheezing, rhonchi or rales.  Musculoskeletal:     Cervical back: Neck supple.  Skin:    General: Skin is warm.     Findings: No rash.  Neurological:     Mental Status: She is alert and oriented to person, place, and time.  Psychiatric:        Behavior: Behavior normal.   Previous notes and tests were reviewed. The plan was reviewed with the patient/family, and all questions/concerned were addressed.  It was my pleasure to see Melecia today and participate in her care. Please feel free to contact me with any questions or concerns.  Sincerely,  Rexene Alberts, DO Allergy & Immunology  [facility] office: [place] office: [phone]

## 2020-08-21 NOTE — Telephone Encounter (Signed)
Patient was scheduled with Dr. Benjamine Mola on 08/18/2020, but rescheduled to 09/08/2020 '@220p'$ 

## 2020-09-07 ENCOUNTER — Encounter (HOSPITAL_BASED_OUTPATIENT_CLINIC_OR_DEPARTMENT_OTHER): Payer: Self-pay | Admitting: Emergency Medicine

## 2020-09-07 ENCOUNTER — Emergency Department (HOSPITAL_BASED_OUTPATIENT_CLINIC_OR_DEPARTMENT_OTHER)
Admission: EM | Admit: 2020-09-07 | Discharge: 2020-09-07 | Disposition: A | Discharge status: Home / Self Care | Payer: 59 | Attending: Emergency Medicine | Admitting: Emergency Medicine

## 2020-09-07 ENCOUNTER — Other Ambulatory Visit: Payer: Self-pay

## 2020-09-07 ENCOUNTER — Emergency Department (HOSPITAL_BASED_OUTPATIENT_CLINIC_OR_DEPARTMENT_OTHER): Payer: 59

## 2020-09-07 DIAGNOSIS — R102 Pelvic and perineal pain: Secondary | ICD-10-CM | POA: Insufficient documentation

## 2020-09-07 DIAGNOSIS — J45909 Unspecified asthma, uncomplicated: Secondary | ICD-10-CM | POA: Diagnosis not present

## 2020-09-07 DIAGNOSIS — E039 Hypothyroidism, unspecified: Secondary | ICD-10-CM | POA: Diagnosis not present

## 2020-09-07 DIAGNOSIS — Z86010 Personal history of colonic polyps: Secondary | ICD-10-CM | POA: Diagnosis not present

## 2020-09-07 DIAGNOSIS — Z79899 Other long term (current) drug therapy: Secondary | ICD-10-CM | POA: Insufficient documentation

## 2020-09-07 DIAGNOSIS — N939 Abnormal uterine and vaginal bleeding, unspecified: Secondary | ICD-10-CM | POA: Diagnosis present

## 2020-09-07 DIAGNOSIS — Z87891 Personal history of nicotine dependence: Secondary | ICD-10-CM | POA: Insufficient documentation

## 2020-09-07 DIAGNOSIS — R1032 Left lower quadrant pain: Secondary | ICD-10-CM | POA: Insufficient documentation

## 2020-09-07 DIAGNOSIS — N898 Other specified noninflammatory disorders of vagina: Secondary | ICD-10-CM | POA: Diagnosis not present

## 2020-09-07 DIAGNOSIS — Z86018 Personal history of other benign neoplasm: Secondary | ICD-10-CM | POA: Insufficient documentation

## 2020-09-07 HISTORY — DX: Hyperlipidemia, unspecified: E78.5

## 2020-09-07 LAB — COMPREHENSIVE METABOLIC PANEL
ALT: 37 U/L (ref 0–44)
AST: 26 U/L (ref 15–41)
Albumin: 4.2 g/dL (ref 3.5–5.0)
Alkaline Phosphatase: 57 U/L (ref 38–126)
Anion gap: 10 (ref 5–15)
BUN: 14 mg/dL (ref 6–20)
CO2: 24 mmol/L (ref 22–32)
Calcium: 9 mg/dL (ref 8.9–10.3)
Chloride: 106 mmol/L (ref 98–111)
Creatinine, Ser: 1 mg/dL (ref 0.44–1.00)
GFR, Estimated: >60 mL/min (ref >60)
Glucose, Bld: 86 mg/dL (ref 70–99)
Potassium: 4.2 mmol/L (ref 3.5–5.1)
Sodium: 140 mmol/L (ref 135–145)
Total Bilirubin: 0.4 mg/dL (ref 0.3–1.2)
Total Protein: 7.9 g/dL (ref 6.5–8.1)

## 2020-09-07 LAB — URINALYSIS, ROUTINE W REFLEX MICROSCOPIC
Glucose, UA: NEGATIVE mg/dL
Hgb urine dipstick: NEGATIVE
Ketones, ur: NEGATIVE mg/dL
Leukocytes,Ua: NEGATIVE
Nitrite: NEGATIVE
Protein, ur: NEGATIVE mg/dL
Specific Gravity, Urine: >1.03 — ABNORMAL HIGH (ref 1.005–1.030)
pH: 5 (ref 5.0–8.0)

## 2020-09-07 LAB — CBC
HCT: 42.6 % (ref 36.0–46.0)
Hemoglobin: 14.3 g/dL (ref 12.0–15.0)
MCH: 34.5 pg — ABNORMAL HIGH (ref 26.0–34.0)
MCHC: 33.6 g/dL (ref 30.0–36.0)
MCV: 102.9 fL — ABNORMAL HIGH (ref 80.0–100.0)
Platelets: 333 10*3/uL (ref 150–400)
RBC: 4.14 MIL/uL (ref 3.87–5.11)
RDW: 11.8 % (ref 11.5–15.5)
WBC: 10.2 10*3/uL (ref 4.0–10.5)
nRBC: 0 % (ref 0.0–0.2)

## 2020-09-07 LAB — WET PREP, GENITAL
Clue Cells Wet Prep HPF POC: NONE SEEN
Sperm: NONE SEEN
Trich, Wet Prep: NONE SEEN
Yeast Wet Prep HPF POC: NONE SEEN

## 2020-09-07 LAB — LIPASE, BLOOD: Lipase: 25 U/L (ref 11–51)

## 2020-09-07 MED ORDER — HYDROCODONE-ACETAMINOPHEN 5-325 MG PO TABS: 1.0000 | ORAL_TABLET | ORAL | 0 refills | Status: DC | PRN

## 2020-09-07 NOTE — ED Triage Notes (Signed)
Pt arrives pov with c/o vaginal bleeding x 1 week. Pt reports post menopausal  x 12 years. Reports LLQ cramping. Pt endorses 400 mg ibuprofen at 0800 with mild relief

## 2020-09-07 NOTE — ED Provider Notes (Signed)
[place] EMERGENCY DEPARTMENT Provider Note   CSN: [id number] Arrival date & time: 09/07/20  1517     History Chief Complaint  Patient presents with   Vaginal Bleeding    Cathy Baxter is a 60 y.o. female.  The history is provided by the patient, the spouse and medical records. No language interpreter was used.  Vaginal Bleeding Quality:  Dark red Severity:  Moderate Onset quality:  Gradual Duration:  1 month Timing:  Intermittent Progression:  Waxing and waning Chronicity:  Recurrent Menstrual history:  Postmenopausal Possible pregnancy: no   Context: at rest   Relieved by:  Prescription medications Worsened by:  Nothing Ineffective treatments:  None tried Associated symptoms: abdominal pain   Associated symptoms: no back pain, no dizziness, no dysuria, no fatigue, no fever, no nausea and no vaginal discharge   Risk factors: gynecological surgery (recent biopsy of some kind)       Past Medical History:  Diagnosis Date   Allergy    Anemia    Arthritis    Asthma    Chicken pox    GERD (gastroesophageal reflux disease)    Hiatal hernia    Hyperlipidemia    Pigmentary glaucoma of both eyes    Polyp of stomach    Shingles    Thyroid disease    Urinary incontinence    Uterine fibroid    Uterine fibroids in pregnancy, postpartum condition 11/06/2015    Patient Active Problem List   Diagnosis Date Noted   PAT (paroxysmal atrial tachycardia) ([place]) 05/28/2020   PAC (premature atrial contraction) 05/28/2020   Uterine fibroid    Urinary incontinence    Thyroid disease    Shingles    Polyp of stomach    Hiatal hernia    GERD (gastroesophageal reflux disease)    Chicken pox    Arthritis    Anemia    Allergy    Other allergic rhinitis 05/09/2020   Paroxysmal SVT (supraventricular tachycardia) ([place]) 04/02/2020   Dyspnea on exertion 02/27/2020   Transaminitis 02/26/2020   Gastroesophageal reflux disease 02/26/2020   Morbid obesity ([place])  02/26/2020   Personal history of colonic polyps 02/26/2020   Depression with anxiety 12/13/2019   Palpitations 12/13/2019   Shortness of breath 12/13/2019   Uncontrolled daytime somnolence 12/12/2019   Other fatigue 12/12/2019   Facet hypertrophy of lumbar region 07/27/2018   DDD (degenerative disc disease), lumbar 04/10/2018   Primary osteoarthritis of both knees 04/10/2018   Primary osteoarthritis of both hands 04/10/2018   Non-seasonal allergic rhinitis due to pollen 02/07/2018   Other meniscus derangements, posterior horn of medial meniscus, left knee 11/24/2017   Obesity (BMI 30-39.9) 02/20/2016   Cataract of both eyes 02/20/2016   Pigmentary glaucoma of both eyes    Hyperlipidemia 11/06/2015   Asthma 11/06/2015   Uterine fibroids in pregnancy, postpartum condition 11/06/2015   Hypothyroidism 10/17/2015   Myopia of both eyes 09/19/2015    Past Surgical History:  Procedure Laterality Date   CHOLECYSTECTOMY  2002   EYE SURGERY     KNEE ARTHROSCOPY Left 08/2015   NASAL SINUS SURGERY  2000     OB History     Gravida  3   Para  3   Term  3   Preterm      AB      Living  2      SAB      IAB      Ectopic      Multiple  Live Births              Family History  Problem Relation Age of Onset   Heart disease Mother    Asthma Mother    COPD Mother    Early death Father    Cancer Son 47       Neuroepithelioma    Breast cancer Sister 11   Celiac disease Daughter    Allergic rhinitis Neg Hx    Eczema Neg Hx    Urticaria Neg Hx    Angioedema Neg Hx    Immunodeficiency Neg Hx    Atopy Neg Hx     Social History   Tobacco Use   Smoking status: Former    Packs/day: 3.00    Years: 12.00    Pack years: 36.00    Types: Cigarettes    Quit date: 07/12/1983    Years since quitting: 37.1   Smokeless tobacco: Never  Vaping Use   Vaping Use: Never used  Substance Use Topics   Alcohol use: Not Currently    Comment: once every 6 months    Drug  use: No    Home Medications Prior to Admission medications   Medication Sig Start Date End Date Taking? Authorizing Provider  albuterol (PROAIR HFA) 108 (90 Base) MCG/ACT inhaler Inhale 2 puffs into the lungs every 6 (six) hours as needed for wheezing or shortness of breath. 10/09/19   Lucretia Kern, DO  atorvastatin (LIPITOR) 20 MG tablet Take 1 tablet (20 mg total) by mouth daily. 04/08/20 07/07/20  Tobb, Kardie, DO  atorvastatin (LIPITOR) 20 MG tablet atorvastatin 20 mg tablet   1 tablet every day by oral route. 04/08/20   [provider]  b complex vitamins capsule Take 1 capsule by mouth daily.    [provider]  brimonidine (ALPHAGAN) 0.2 % ophthalmic solution Place 1 drop into both eyes 2 (two) times daily. 02/25/18   [provider]  Budeson-Glycopyrrol-Formoterol (BREZTRI AEROSPHERE) 160-9-4.8 MCG/ACT AERO Inhale 2 puffs into the lungs in the morning and at bedtime. with spacer and rinse mouth afterwards. 05/08/20   Garnet Sierras, DO  Cholecalciferol (VITAMIN D3) 75 MCG (3000 UT) TABS Take by mouth.    [provider]  dorzolamide-timolol (COSOPT) 22.3-6.8 MG/ML ophthalmic solution Place 1 drop into both eyes 2 (two) times daily.  09/18/15   [provider]  DULoxetine (CYMBALTA) 30 MG capsule Take 1 capsule (30 mg total) by mouth daily. 02/26/20   Kuneff, Renee A, DO  levocetirizine (XYZAL) 5 MG tablet Take 1 tablet (5 mg total) by mouth every evening. 05/08/20   Garnet Sierras, DO  levothyroxine (SYNTHROID) 25 MCG tablet Take 1 tablet (25 mcg total) by mouth daily before breakfast. 02/26/20   Kuneff, Renee A, DO  propranolol (INDERAL) 20 MG tablet Take 1 tablet (20 mg total) by mouth at bedtime. 03/24/20   Tobb, Kardie, DO    Allergies    Azithromycin, Percocet [oxycodone-acetaminophen], Amoxicillin, Doxycycline, Oxycodone, Prednisone, and Demerol [meperidine]  Review of Systems   Review of Systems  Constitutional:  Negative for chills, fatigue and  fever.  HENT:  Negative for congestion.   Respiratory:  Negative for cough, chest tightness and wheezing.   Cardiovascular:  Negative for chest pain.  Gastrointestinal:  Positive for abdominal pain. Negative for abdominal distention, constipation, diarrhea and nausea.  Genitourinary:  Positive for pelvic pain and vaginal bleeding. Negative for dysuria, flank pain and vaginal discharge.  Musculoskeletal:  Negative for back pain.  Skin:  Negative for wound.  Neurological:  Negative for dizziness and headaches.  Psychiatric/Behavioral:  Negative for agitation.   All other systems reviewed and are negative.  Physical Exam Updated Vital Signs BP (!) 127/54 (BP Location: Right Arm)   Pulse 85   Temp 98 F (36.7 C) (Oral)   Resp 18   Ht '5\' 3"'$  (1.6 m)   Wt 95.3 kg   SpO2 98%   BMI 37.20 kg/m   Physical Exam Vitals and nursing note reviewed. Exam conducted with a chaperone present.  Constitutional:      General: She is not in acute distress.    Appearance: She is well-developed. She is not ill-appearing, toxic-appearing or diaphoretic.  HENT:     Head: Normocephalic and atraumatic.     Nose: No congestion or rhinorrhea.     Mouth/Throat:     Mouth: Mucous membranes are moist.  Eyes:     Extraocular Movements: Extraocular movements intact.     Conjunctiva/sclera: Conjunctivae normal.     Pupils: Pupils are equal, round, and reactive to light.  Cardiovascular:     Rate and Rhythm: Normal rate and regular rhythm.     Heart sounds: No murmur heard. Pulmonary:     Effort: Pulmonary effort is normal. No respiratory distress.     Breath sounds: Normal breath sounds. No wheezing, rhonchi or rales.  Chest:     Chest wall: No tenderness.  Abdominal:     General: Abdomen is flat. Bowel sounds are normal.     Palpations: Abdomen is soft.     Tenderness: There is abdominal tenderness in the suprapubic area and left lower quadrant. There is no right CVA tenderness, left CVA tenderness,  guarding or rebound.  Genitourinary:    Labia:        Right: No tenderness.        Left: No tenderness.      Vagina: Vaginal discharge present. No erythema, bleeding or prolapsed vaginal walls.     Cervix: No cervical motion tenderness, erythema or cervical bleeding.     Uterus: Not tender.      Adnexa:        Right: No tenderness.         Left: No tenderness.    Musculoskeletal:        General: No tenderness.     Cervical back: Neck supple.  Skin:    General: Skin is warm and dry.     Capillary Refill: Capillary refill takes less than 2 seconds.     Findings: No erythema.  Neurological:     General: No focal deficit present.     Mental Status: She is alert.    ED Results / Procedures / Treatments   Labs (all labs ordered are listed, but only abnormal results are displayed) Labs Reviewed  WET PREP, GENITAL - Abnormal; Notable for the following components:      Result Value   WBC, Wet Prep HPF POC FEW (*)    All other components within normal limits  URINALYSIS, ROUTINE W REFLEX MICROSCOPIC - Abnormal; Notable for the following components:   Specific Gravity, Urine >1.030 (*)    Bilirubin Urine SMALL (*)    All other components within normal limits  CBC - Abnormal; Notable for the following components:   MCV 102.9 (*)    MCH 34.5 (*)    All other components within normal limits  LIPASE, BLOOD  COMPREHENSIVE METABOLIC PANEL  GC/CHLAMYDIA PROBE AMP ([place]) NOT AT  [place]    EKG None  Radiology US PELVIC COMPLETE W TRANSVAGINAL AND TORSION R/O  Result Date: 09/07/2020 CLINICAL DATA:  Postmenopausal bleeding EXAM: TRANSABDOMINAL AND TRANSVAGINAL ULTRASOUND OF PELVIS TECHNIQUE: Both transabdominal and transvaginal ultrasound examinations of the pelvis were performed. Transabdominal technique was performed for global imaging of the pelvis including uterus, ovaries, adnexal regions, and pelvic cul-de-sac. It was necessary to proceed with endovaginal exam following the  transabdominal exam to visualize the endometrium and bilateral ovaries. COMPARISON:  CT abdomen pelvis 05/25/2018 CT abdomen pelvis 03/16/2018 FINDINGS: Uterus Measurements: 6.3 x 3.5 x 5 cm = volume: 58 mL. No fibroids or other mass visualized. Nabothian cysts noted along the cervix. Endometrium Thickness: 20 mm. Right ovary Not visualized. Left ovary Not visualized. Other findings No abnormal free fluid. IMPRESSION: Thickened endometrium in a postmenopausal patient. Recommend gynecologic consultation as malignancy is not excluded. Electronically Signed   By: Iven Finn M.D.   On: 09/07/2020 20:53    Procedures Procedures   Medications Ordered in ED Medications - No data to display  ED Course  I have reviewed the triage vital signs and the nursing notes.  Pertinent labs & imaging results that were available during my care of the patient were reviewed by me and considered in my medical decision making (see chart for details).    MDM Rules/Calculators/A&P                           Cathy Baxter is a 60 y.o. postmenopausal female with a past medical history significant for hyperlipidemia, asthma, hypothyroidism, obesity, paroxysmal SVT, GERD, depression uterine fibroids, and glaucoma who presents with vaginal bleeding and abdominal pain.  According to patient, for the last month she has been having on and off vaginal bleeding.  She saw her OB/GYN who reportedly did a biopsy of some kind and told her that everything looked good.  She was started on a progesterone medication and the bleeding had improved.  She reports she started having more pain about a week ago and stopped taking the progesterone for 1 day and subsequently she started having more vaginal bleeding.  She reports last week she has had worsening vaginal bleeding on and off.  She reports her pain has also worsened in the lower abdomen and left lower quadrant and left pelvic area.  She denies nausea, vomiting, fevers, chills, chest  pain, shortness of breath.  She denies constipation, diarrhea, or any dysuria.  She reports that with the pain continuing, she was told to come in for evaluation. She denies any vaginal discharge.  Denies any history of diverticulitis.  Was told that she may need a D&C if symptoms do not improve.  On exam, lungs clear and chest nontender.  Abdomen is tender in the left lower quadrant/left pelvic area.  Back nontender.  No rash seen.  A pelvic exam was performed with a chaperone and she did have some vaginal discharge.  Swabs were collected to look for [facility] chlamydia/wet prep.  There was no vaginal bleeding on my evaluation however.  She did not have significant tenderness.    We will get a pelvic ultrasound to look for torsion versus other causes of pain.  We offered pain medicine but she wanted to wait on that.  We will get the wet prep.  If ultrasound is unrevealing, patient may need CT scan to look for other causes of significant abdominal pain however I suspect this is all related to painful  postmenstrual bleeding and she will need OB/GYN follow-up to discuss further measures.  Ultrasound showed thickened endometrium but otherwise no acute abnormality.  The ovaries were unable to be seen on ultrasound however patient reports her pain was improving.  We discussed the possibly getting a CT scan to look for other abnormalities but patient would rather follow-up with her OB/GYN given her otherwise reassuring labs as well.  She was not anemic and on her pelvic exam she did not have any vaginal bleeding.  The wet prep did not show evidence of STI with no BV either.  Patient will follow-up on her GC/chlamydia swab and will call her OB/GYN for further management.  She will continue her medication to prevent further bleeding and had no other questions or concerns.  Patient discharged in good condition with improved symptoms with understanding of follow-up and return.  Final Clinical Impression(s) / ED  Diagnoses Final diagnoses:  Vaginal bleeding  Suprapubic abdominal pain    Rx / DC Orders ED Discharge Orders     None      Clinical Impression: 1. Suprapubic abdominal pain   2. Vaginal bleeding     Disposition: Discharge  Condition: Good  I have discussed the results, Dx and Tx plan with the pt(& family if present). He/she/they expressed understanding and agree(s) with the plan. Discharge instructions discussed at great length. Strict return precautions discussed and pt &/or family have verbalized understanding of the instructions. No further questions at time of discharge.    Discharge Medication List as of 09/07/2020 11:14 PM     START taking these medications   Details  HYDROcodone-acetaminophen (NORCO/VICODIN) 5-325 MG tablet Take 1 tablet by mouth every 4 (four) hours as needed., Starting Sun 09/07/2020, Normal        Follow Up: Your [facility]     [facility] [place] EMERGENCY DEPARTMENT [address] [id number] [place] [phone]       Geddy Boydstun, Gwenyth Allegra, MD 09/07/20 [id number]

## 2020-09-07 NOTE — Discharge Instructions (Addendum)
Your history, exam, work-up today are consistent with vaginal bleeding related to your abnormal endometrium.  Please continue your medications to prevent that vaginal bleeding.  Your bleeding had stopped on our exam and we did not find any other concerning findings.  Please rest and stay hydrated and call your OB/GYN tomorrow to discuss further management.  If any symptoms change or worsen, please return for likely CT imaging as we discussed that would be the likely the neck step.  Please use the pain medicine to help with discomfort.

## 2020-09-09 ENCOUNTER — Other Ambulatory Visit: Payer: Self-pay

## 2020-09-09 ENCOUNTER — Ambulatory Visit (INDEPENDENT_AMBULATORY_CARE_PROVIDER_SITE_OTHER): Payer: 59 | Admitting: Family Medicine

## 2020-09-09 ENCOUNTER — Encounter: Payer: Self-pay | Admitting: Family Medicine

## 2020-09-09 VITALS — BP 137/83 | HR 80 | Temp 98.0°F | Ht 62.0 in | Wt 225.0 lb

## 2020-09-09 DIAGNOSIS — L821 Other seborrheic keratosis: Secondary | ICD-10-CM

## 2020-09-09 LAB — GC/CHLAMYDIA PROBE AMP (~~LOC~~) NOT AT ARMC
Chlamydia: NEGATIVE
Comment: NEGATIVE
Comment: NORMAL
Neisseria Gonorrhea: NEGATIVE

## 2020-09-09 NOTE — Progress Notes (Signed)
This visit occurred during the SARS-CoV-2 public health emergency.  Safety protocols were in place, including screening questions prior to the visit, additional usage of staff PPE, and extensive cleaning of exam room while observing appropriate contact time as indicated for disinfecting solutions.    Cathy Baxter , [birth year], 60 y.o., female MRN: [id number] Patient Care Team    Relationship Specialty Notifications Start End  Ma Hillock, DO PCP - General Family Medicine  08/30/16   Philemon Kingdom, MD Consulting Physician Internal Medicine  02/20/16    Comment: endocrine  Linda Hedges, [place] Physician Obstetrics and Gynecology  02/20/16   Mcarthur Rossetti, MD Consulting Physician Orthopedic Surgery  07/11/18   Wylene Simmer, MD Consulting Physician Orthopedic Surgery  02/27/20   Paralee Cancel, [place] Physician Orthopedic Surgery  02/27/20     Chief Complaint  Patient presents with   Nevus    Pt present with mole on back and was informed of changes within last week     Subjective: Pt presents for an OV with complaints of back lesiona few months duration. She reports her boyfriend felt that it had grown and called her attention to it. She had noticed it was itchy at times. She has no personal or fhx of skin cancers.    Depression screen [facility] 2/9 12/12/2019 07/25/2018 06/01/2017 02/20/2016  Decreased Interest 3 0 0 0  Down, Depressed, Hopeless 3 0 0 0  PHQ - 2 Score 6 0 0 0  Altered sleeping 3 - - -  Tired, decreased energy 3 - - -  Change in appetite 3 - - -  Feeling bad or failure about yourself  3 - - -  Trouble concentrating 3 - - -  Moving slowly or fidgety/restless 0 - - -  Suicidal thoughts 0 - - -  PHQ-9 Score 21 - - -    Allergies  Allergen Reactions   Azithromycin Palpitations    Arrhythmia side effects   Percocet [Oxycodone-Acetaminophen] Shortness Of Breath   Amoxicillin Hives    Has patient had a PCN reaction causing immediate rash,  facial/tongue/throat swelling, SOB or lightheadedness with hypotension: Yes Has patient had a PCN reaction causing severe rash involving mucus membranes or skin necrosis: No Has patient had a PCN reaction that required hospitalization No Has patient had a PCN reaction occurring within the last 10 years: Yes  If all of the above answers are "NO", then may proceed with Cephalosporin use.    Doxycycline Itching   Oxycodone    Prednisone     Caution>> severe glaucoma.    Demerol [Meperidine] Palpitations   Social History   Social History Narrative   Divorced. She has had 3 children, one passed away young from cancer.    11th grade education. Works at [employer].    Former smoker.   Drinks caffeine.    Wears seatbelt, smoke detector in the home.    Firearms in the home.    Feels safe in her relationships.    Past Medical History:  Diagnosis Date   Allergy    Anemia    Arthritis    Asthma    Chicken pox    GERD (gastroesophageal reflux disease)    Hiatal hernia    Hyperlipidemia    Pigmentary glaucoma of both eyes    Polyp of stomach    Shingles    Thyroid disease    Urinary incontinence    Uterine fibroid    Uterine fibroids in  pregnancy, postpartum condition 11/06/2015   Past Surgical History:  Procedure Laterality Date   CHOLECYSTECTOMY  2002   EYE SURGERY     KNEE ARTHROSCOPY Left 08/2015   NASAL SINUS SURGERY  2000   Family History  Problem Relation Age of Onset   Heart disease Mother    Asthma Mother    COPD Mother    Early death Father    Cancer Son 29       Neuroepithelioma    Breast cancer Sister 62   Celiac disease Daughter    Allergic rhinitis Neg Hx    Eczema Neg Hx    Urticaria Neg Hx    Angioedema Neg Hx    Immunodeficiency Neg Hx    Atopy Neg Hx    Allergies as of 09/09/2020       Reactions   Azithromycin Palpitations   Arrhythmia side effects   Percocet [oxycodone-acetaminophen] Shortness Of Breath   Amoxicillin Hives   Has patient had a  PCN reaction causing immediate rash, facial/tongue/throat swelling, SOB or lightheadedness with hypotension: Yes Has patient had a PCN reaction causing severe rash involving mucus membranes or skin necrosis: No Has patient had a PCN reaction that required hospitalization No Has patient had a PCN reaction occurring within the last 10 years: Yes  If all of the above answers are "NO", then may proceed with Cephalosporin use.   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    Demerol [meperidine] Palpitations        Medication List        Accurate as of September 09, 2020  2:44 PM. If you have any questions, ask your nurse or doctor.          Advair Diskus 250-50 MCG/ACT Aepb Generic drug: fluticasone-salmeterol Inhale 1 puff into the lungs 2 (two) times daily.   albuterol 108 (90 Base) MCG/ACT inhaler Commonly known as: ProAir HFA Inhale 2 puffs into the lungs every 6 (six) hours as needed for wheezing or shortness of breath.   atorvastatin 20 MG tablet Commonly known as: LIPITOR Take 1 tablet (20 mg total) by mouth daily.   atorvastatin 20 MG tablet Commonly known as: LIPITOR atorvastatin 20 mg tablet   1 tablet every day by oral route.   b complex vitamins capsule Take 1 capsule by mouth daily.   Breztri Aerosphere 160-9-4.8 MCG/ACT Aero Generic drug: Budeson-Glycopyrrol-Formoterol Inhale 2 puffs into the lungs in the morning and at bedtime. with spacer and rinse mouth afterwards.   brimonidine 0.2 % ophthalmic solution Commonly known as: ALPHAGAN Place 1 drop into both eyes 2 (two) times daily.   dorzolamide-timolol 22.3-6.8 MG/ML ophthalmic solution Commonly known as: COSOPT Place 1 drop into both eyes 2 (two) times daily.   DULoxetine 30 MG capsule Commonly known as: Cymbalta Take 1 capsule (30 mg total) by mouth daily.   HYDROcodone-acetaminophen 5-325 MG tablet Commonly known as: NORCO/VICODIN Take 1 tablet by mouth every 4  (four) hours as needed.   levocetirizine 5 MG tablet Commonly known as: Xyzal Take 1 tablet (5 mg total) by mouth every evening.   levothyroxine 25 MCG tablet Commonly known as: SYNTHROID Take 1 tablet (25 mcg total) by mouth daily before breakfast.   mometasone 0.1 % cream Commonly known as: ELOCON Apply topically.   progesterone 200 MG capsule Commonly known as: PROMETRIUM Take 200 mg by mouth daily.   propranolol 20 MG tablet Commonly known as: INDERAL Take 1  tablet (20 mg total) by mouth at bedtime.   Vitamin D3 75 MCG (3000 UT) Tabs Take by mouth.        All past medical history, surgical history, allergies, family history, immunizations andmedications were updated in the EMR today and reviewed under the history and medication portions of their EMR.     ROS: Negative, with the exception of above mentioned in HPI   Objective:  BP 137/83   Pulse 80   Temp 98 F (36.7 C) (Oral)   Ht '5\' 2"'$  (1.575 m)   Wt 225 lb (102.1 kg)   SpO2 95%   BMI 41.15 kg/m  Body mass index is 41.15 kg/m. Gen: Afebrile. No acute distress. Nontoxic in appearance, well developed, well nourished.  HENT: AT. [place].  Skin: 0.5cm x0.75 cm elevated crusty brown/grey lesion of mid-thoracic back.   Neuro:  Alert. Oriented x3  Psych: Normal affect, dress and demeanor. Normal speech. Normal thought content and judgment.  No results found. No results found. No results found for this or any previous visit (from the past 24 hour(s)).  Assessment/Plan: Courntey Den[place]ale is a 60 y.o. female present for OV for  Seborrheic keratosis Discussed SK are benign. Reassured patient. Offered cryo freeze and she was agreeable today to cryo tx.  Cryofreeze application to x1 back lesion completed. Pt tolerated procedure.  After care instructions provided.  F/u PRN  Reviewed expectations re: course of current medical issues. Discussed self-management of symptoms. Outlined signs and symptoms indicating need for  more acute intervention. Patient verbalized understanding and all questions were answered. Patient received an After-Visit Summary.    No orders of the defined types were placed in this encounter.  No orders of the defined types were placed in this encounter.  Referral Orders  No referral(s) requested today     Note is dictated utilizing voice recognition software. Although note has been proof read prior to signing, occasional typographical errors still can be missed. If any questions arise, please do not hesitate to call for verification.   electronically signed by:  Howard Pouch, DO  [place]

## 2020-09-09 NOTE — Patient Instructions (Signed)
Seborrheic Keratosis A seborrheic keratosis is a common, noncancerous (benign) skin growth. These growths are velvety, waxy, rough, tan, brown, or black spots that appear on the skin. These skin growths can be flat or raised, andscaly. What are the causes? The cause of this condition is not known. What increases the risk? You are more likely to develop this condition if you: Have a family history of seborrheic keratosis. Are 50 or older. Are pregnant. Have had estrogen replacement therapy. What are the signs or symptoms? Symptoms of this condition include growths on the face, chest, shoulders, back, or other areas. These growths: Are usually painless, but may become irritated and itchy. Can be yellow, brown, black, or other colors. Are slightly raised or have a flat surface. Are sometimes rough or wart-like in texture. Are often velvety or waxy on the surface. Are round or oval-shaped. Often occur in groups, but may occur as a single growth. How is this diagnosed? This condition is diagnosed with a medical history and physical exam. A sample of the growth may be tested (skin biopsy). You may need to see a skin specialist (dermatologist). How is this treated? Treatment is not usually needed for this condition, unless the growths are irritated or bleed often. You may also choose to have the growths removed if you do not like their appearance. Most commonly, these growths are treated with a procedure in which liquid nitrogen is applied to "freeze" off the growth (cryosurgery). They may also be burned off with electricity (electrocautery) or removed by scraping (curettage). Follow these instructions at home: Watch your growth for any changes. Keep all follow-up visits as told by your health care provider. This is important. Do not scratch or pick at the growth or growths. This can cause them to become irritated or infected. Contact a health care provider if: You suddenly have many new  growths. Your growth bleeds, itches, or hurts. Your growth suddenly becomes larger or changes color. Summary A seborrheic keratosis is a common, noncancerous (benign) skin growth. Treatment is not usually needed for this condition, unless the growths are irritated or bleed often. Watch your growth for any changes. Contact a health care provider if you suddenly have many new growths or your growth suddenly becomes larger or changes color. Keep all follow-up visits as told by your health care provider. This is important. This information is not intended to replace advice given to you by your health care provider. Make sure you discuss any questions you have with your healthcare provider. Document Revised: 05/12/2017 Document Reviewed: 05/12/2017 Elsevier Patient Education  2022 Elsevier Inc.  

## 2020-09-17 ENCOUNTER — Encounter: Payer: Self-pay | Admitting: Allergy

## 2020-09-17 NOTE — Progress Notes (Signed)
Reviewed notes from Dr. Benjamine Mola. Date of service: 09/15/2020. See scanned notes for full documentation. No polyps.  Empty nose syndrome.

## 2020-10-21 ENCOUNTER — Ambulatory Visit: Payer: 59 | Admitting: Allergy

## 2020-10-21 DIAGNOSIS — J309 Allergic rhinitis, unspecified: Secondary | ICD-10-CM

## 2020-10-21 NOTE — Progress Notes (Deleted)
Follow Up Note  RE: Cathy Baxter MRN: [id number] DOB: Nov 08, 1960 Date of Office Visit: 10/21/2020  Referring provider: Ma Hillock, DO Primary care provider: Ma Hillock, DO  Chief Complaint: No chief complaint on file.  History of Present Illness: I had the pleasure of seeing Cathy Baxter for a follow up visit at the [facility] on 10/21/2020. She is a 60 y.o. female, who is being followed for asthma and allergic rhinitis. Her previous allergy office visit was on 08/21/2020 with Dr. Maudie Mercury. Today is a regular follow up visit.  Asthma Past history - Issues with her breathing for 20+ years.  Currently on Advair 250 mcg 1 puff twice a day x1 year and albuterol as needed with good benefit.  Cathy Baxter in the past.  Using albuterol less than once per week.  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  Takes Nexium as needed for reflux.  Seen cardiology as well. Interim history - did not start Breztri and still on Advair. Sometimes has wheezing and shortness of breath.  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  If no improvement with below regimen, will discuss starting biologics.  Spirometry today showed some restriction. ACT score 17. Daily controller medication(s): Stop Advair for now.  Start Breztri 118mcg 2 puffs twice a day with spacer and rinse mouth afterwards. Demonstrated proper use with inhaler. Patient understood technique and all questions/concerned were addressed.  May use albuterol rescue inhaler 2 puffs every 4 to 6 hours as needed for shortness of breath, chest tightness, coughing, and wheezing. May use albuterol rescue inhaler 2 puffs 5 to 15 minutes prior to strenuous physical activities. Monitor frequency of use.  Get spirometry at next visit.   Other allergic rhinitis Past history - Perennial rhinitis symptoms with no specific triggers. 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  Skin testing in 2014  was positive to dust mites, cat, dog, horse, feathers, grass, trees, pigweed, mold.  She was on allergy immunotherapy for less than 1 year.  She also had sinus surgery in 2000. 2022 skin testing showed: Positive to cats, dogs.  Negative to common foods. No pets at home.   Interim history - unchanged symptoms. Xyzal caused sleepiness. Did not see ENT yet. Continue environmental control measures as below. Try to take Xyzal (levocetirizine) 5mg  1/2 tablet at night. If it still makes you still drowsy then try: Over the counter antihistamines such as Claritin (loratadine), Allegra (fexofenadine), or Zyrtec (cetirizine) daily as needed. Nasal saline spray (i.e., Simply Saline) or nasal saline lavage (i.e., NeilMed) is recommended as needed and prior to medicated nasal sprays. 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  Recommend ENT evaluation next due to prior sinus surgeries - Dr. Benjamine Mola.   Reviewed notes from Dr. Benjamine Mola. Date of service: 09/15/2020. See scanned notes for full documentation. No polyps.  Empty nose syndrome.    Assessment and Plan: Cathy Baxter is a 60 y.o. female with: No problem-specific Assessment & Plan notes found for this encounter.  No follow-ups on file.  No orders of the defined types were placed in this encounter.  Lab Orders  No laboratory test(s) ordered today    Diagnostics: Spirometry:  Tracings reviewed. Her effort: {Blank single:19197::"Good reproducible efforts.","It was hard to get consistent efforts and there is a question as to whether this reflects a maximal maneuver.","Poor effort, data can not be interpreted."} FVC: ***L FEV1: ***L, ***% predicted FEV1/FVC ratio: ***% Interpretation: {  Blank single:19197::"Spirometry consistent with mild obstructive disease","Spirometry consistent with moderate obstructive disease","Spirometry consistent with severe obstructive disease","Spirometry consistent with possible restrictive disease","Spirometry consistent  with mixed obstructive and restrictive disease","Spirometry uninterpretable due to technique","Spirometry consistent with normal pattern","No overt abnormalities noted given today's efforts"}.  Please see scanned spirometry results for details.  Skin Testing: {Blank single:19197::"Select foods","Environmental allergy panel","Environmental allergy panel and select foods","Food allergy panel","None","Deferred due to recent antihistamines use"}. *** Results discussed with patient/family.   Medication List:  Current Outpatient Medications  Medication Sig Dispense Refill   ADVAIR DISKUS 250-50 MCG/ACT AEPB Inhale 1 puff into the lungs 2 (two) times daily.     albuterol (PROAIR HFA) 108 (90 Base) MCG/ACT inhaler Inhale 2 puffs into the lungs every 6 (six) hours as needed for wheezing or shortness of breath. 1 each 0   atorvastatin (LIPITOR) 20 MG tablet Take 1 tablet (20 mg total) by mouth daily. 90 tablet 3   atorvastatin (LIPITOR) 20 MG tablet atorvastatin 20 mg tablet   1 tablet every day by oral route.     b complex vitamins capsule Take 1 capsule by mouth daily.     brimonidine (ALPHAGAN) 0.2 % ophthalmic solution Place 1 drop into both eyes 2 (two) times daily.     Budeson-Glycopyrrol-Formoterol (BREZTRI AEROSPHERE) 160-9-4.8 MCG/ACT AERO Inhale 2 puffs into the lungs in the morning and at bedtime. with spacer and rinse mouth afterwards. 10.7 g 3   Cholecalciferol (VITAMIN D3) 75 MCG (3000 UT) TABS Take by mouth.     dorzolamide-timolol (COSOPT) 22.3-6.8 MG/ML ophthalmic solution Place 1 drop into both eyes 2 (two) times daily.      DULoxetine (CYMBALTA) 30 MG capsule Take 1 capsule (30 mg total) by mouth daily. 90 capsule 1   HYDROcodone-acetaminophen (NORCO/VICODIN) 5-325 MG tablet Take 1 tablet by mouth every 4 (four) hours as needed. 15 tablet 0   levocetirizine (XYZAL) 5 MG tablet Take 1 tablet (5 mg total) by mouth every evening. 30 tablet 5   levothyroxine (SYNTHROID) 25 MCG tablet  Take 1 tablet (25 mcg total) by mouth daily before breakfast. 90 tablet 3   mometasone (ELOCON) 0.1 % cream Apply topically.     progesterone (PROMETRIUM) 200 MG capsule Take 200 mg by mouth daily.     propranolol (INDERAL) 20 MG tablet Take 1 tablet (20 mg total) by mouth at bedtime. 90 tablet 3   No current facility-administered medications for this visit.   Allergies: Allergies  Allergen Reactions   Azithromycin Palpitations    Arrhythmia side effects   Percocet [Oxycodone-Acetaminophen] Shortness Of Breath   Amoxicillin Hives    Has patient had a PCN reaction causing immediate rash, facial/tongue/throat swelling, SOB or lightheadedness with hypotension: Yes Has patient had a PCN reaction causing severe rash involving mucus membranes or skin necrosis: No Has patient had a PCN reaction that required hospitalization No Has patient had a PCN reaction occurring within the last 10 years: Yes  If all of the above answers are "NO", then may proceed with Cephalosporin use.    Doxycycline Itching   Oxycodone    Prednisone     Caution>> severe glaucoma.    Demerol [Meperidine] Palpitations   I reviewed her past medical history, social history, family history, and environmental history and no significant changes have been reported from her previous visit.  Review of Systems  Constitutional:  Positive for appetite change (loss of appetite). Negative for chills, fever and unexpected weight change.  HENT:  Positive for congestion and sinus  pressure. Negative for rhinorrhea.   Eyes:  Negative for itching.  Respiratory:  Positive for shortness of breath and wheezing. Negative for cough and chest tightness.   Cardiovascular:  Negative for chest pain.  Gastrointestinal:  Negative for abdominal pain.  Genitourinary:  Negative for difficulty urinating.  Skin:  Negative for rash.  Allergic/Immunologic: Positive for environmental allergies.  Neurological:  Positive for headaches.    Objective: There were no vitals taken for this visit. There is no height or weight on file to calculate BMI. Physical Exam Vitals and nursing note reviewed.  Constitutional:      Appearance: Normal appearance. She is well-developed.  HENT:     Head: Normocephalic and atraumatic.     Right Ear: Tympanic membrane and external ear normal.     Left Ear: Tympanic membrane and external ear normal.     Nose: Nose normal.     Mouth/Throat:     Mouth: Mucous membranes are moist.     Pharynx: Oropharynx is clear.  Eyes:     Conjunctiva/sclera: Conjunctivae normal.  Cardiovascular:     Rate and Rhythm: Normal rate and regular rhythm.     Heart sounds: Normal heart sounds. No murmur heard.   No friction rub. No gallop.  Pulmonary:     Effort: Pulmonary effort is normal.     Breath sounds: Normal breath sounds. No wheezing, rhonchi or rales.  Musculoskeletal:     Cervical back: Neck supple.  Skin:    General: Skin is warm.     Findings: No rash.  Neurological:     Mental Status: She is alert and oriented to person, place, and time.  Psychiatric:        Behavior: Behavior normal.   Previous notes and tests were reviewed. The plan was reviewed with the patient/family, and all questions/concerned were addressed.  It was my pleasure to see Sabrea today and participate in her care. Please feel free to contact me with any questions or concerns.  Sincerely,  Rexene Alberts, DO Allergy & Immunology  [facility] office: [place] office: [phone]

## 2020-11-11 NOTE — H&P (Signed)
Cathy Baxter is an 60 y.o. female G3P3 with postmenopausal bleeding.  Patient's u/s on 05/22/20 showed EMS 6.2 mm with no other abnormality.  She underwent endometrial biopsy with benign pathology.  She continued to have PMB and was started on nightly prometrium.  Repeat u/s was performed after bleeding continued on 8/28 which showed EMS 20 mm.  Patient is here today for H/S, D&C.  Pertinent Gynecological History: Menses: post-menopausal Bleeding: post menopausal bleeding Contraception: post menopausal status DES exposure: unknown Blood transfusions: none Sexually transmitted diseases: no past history Previous GYN Procedures:  n/a   Last mammogram: normal Date: 08/14/20 Last pap: normal Date: 08/14/20 OB History: G3, P3   Menstrual History: Menarche age: n/a No LMP recorded. Patient is postmenopausal.    Past Medical History:  Diagnosis Date   Allergy    Anemia    Arthritis    Asthma    Chicken pox    GERD (gastroesophageal reflux disease)    Hiatal hernia    Hyperlipidemia    Pigmentary glaucoma of both eyes    Polyp of stomach    Shingles    Thyroid disease    Urinary incontinence    Uterine fibroid    Uterine fibroids in pregnancy, postpartum condition 11/06/2015    Past Surgical History:  Procedure Laterality Date   CHOLECYSTECTOMY  2002   EYE SURGERY     KNEE ARTHROSCOPY Left 08/2015   NASAL SINUS SURGERY  2000    Family History  Problem Relation Age of Onset   Heart disease Mother    Asthma Mother    COPD Mother    Early death Father    Cancer Son 32       Neuroepithelioma    Breast cancer Sister 61   Celiac disease Daughter    Allergic rhinitis Neg Hx    Eczema Neg Hx    Urticaria Neg Hx    Angioedema Neg Hx    Immunodeficiency Neg Hx    Atopy Neg Hx     Social History:  reports that she quit smoking about 37 years ago. Her smoking use included cigarettes. She has a 36.00 pack-year smoking history. She has never used smokeless tobacco. She reports  that she does not currently use alcohol. She reports that she does not use drugs.  Allergies:  Allergies  Allergen Reactions   Azithromycin Palpitations    Arrhythmia side effects   Percocet [Oxycodone-Acetaminophen] Shortness Of Breath   Amoxicillin Hives    Has patient had a PCN reaction causing immediate rash, facial/tongue/throat swelling, SOB or lightheadedness with hypotension: Yes Has patient had a PCN reaction causing severe rash involving mucus membranes or skin necrosis: No Has patient had a PCN reaction that required hospitalization No Has patient had a PCN reaction occurring within the last 10 years: Yes  If all of the above answers are "NO", then may proceed with Cephalosporin use.    Doxycycline Itching   Oxycodone    Prednisone     Caution>> severe glaucoma.    Demerol [Meperidine] Palpitations    No medications prior to admission.    Review of Systems  There were no vitals taken for this visit. Physical Exam Constitutional:      Appearance: Normal appearance.  HENT:     Head: Normocephalic and atraumatic.  Pulmonary:     Effort: Pulmonary effort is normal.  Abdominal:     Palpations: Abdomen is soft.  Musculoskeletal:        General: Normal range of  motion.     Cervical back: Normal range of motion.  Skin:    General: Skin is warm and dry.  Neurological:     General: No focal deficit present.     Mental Status: She is alert and oriented to person, place, and time.  Psychiatric:        Mood and Affect: Mood normal.        Behavior: Behavior normal.    No results found for this or any previous visit (from the past 24 hour(s)).  No results found.  Assessment/Plan: 60yo with postmenopausal bleeding -H/S, D&C -Patient is counseled re: risk of bleeding, infection, scarring and damage to surrounding structures.  She is informed of the steps of the procedure as well as postop expectations.  All questions were answered and we will proceed.  Linda Hedges 11/11/2020, 8:23 AM

## 2020-11-12 ENCOUNTER — Ambulatory Visit: Payer: 59 | Admitting: Family Medicine

## 2020-11-12 ENCOUNTER — Encounter (HOSPITAL_COMMUNITY): Payer: Self-pay | Admitting: Obstetrics & Gynecology

## 2020-11-12 ENCOUNTER — Other Ambulatory Visit: Payer: Self-pay

## 2020-11-12 NOTE — Progress Notes (Signed)
PCP - Dr. Shelba Flake  Cardiologist - Dr. Harriet Masson, K.  EP- Denies  Endocrine-Denies  Pulm-Denies  Chest x-ray - 12/12/19 (E)  EKG - 02/29/20 (E)  Stress Test - Denies  ECHO - 03/26/20 (E)  Cardiac Cath - Denies  AICD-na PM-na LOOP-na  Dialysis-Denies  Sleep Study - Yes- Study incomplete and she is awaiting another appointment CPAP - Denies  LABS- 11/13/20: CBC, BMP, T/Sx2  ASA-Denies  ERAS- No  HA1C-Denies  Anesthesia- No  Pt denies having chest pain, sob, or fever during the pre-op phone call. All instructions explained to the pt, with a verbal understanding of the material: as of today, stop taking all Aspirin (unless instructed by your doctor) and Other Aspirin containing products, Vitamins, Fish oils, and Herbal medications. Also stop all NSAIDS i.e. Advil, Ibuprofen, Motrin, Aleve, Anaprox, Naproxen, BC, Goody Powders, and all Supplements.  Pt also instructed to wear a mask and social distance if she has to go out prior to her surgery. The opportunity to ask questions was provided.    Coronavirus Screening  Have you experienced the following symptoms:  Cough yes/no: No Fever (>100.83F)  yes/no: No Runny nose yes/no: No Sore throat yes/no: No Difficulty breathing/shortness of breath  yes/no: No  Have you or a family member traveled in the last 14 days and where? yes/no: No   If the patient indicates "YES" to the above questions, their PAT will be rescheduled to limit the exposure to others and, the surgeon will be notified. THE PATIENT WILL NEED TO BE ASYMPTOMATIC FOR 14 DAYS.   If the patient is not experiencing any of these symptoms, the PAT nurse will instruct them to NOT bring anyone with them to their appointment since they may have these symptoms or traveled as well.   Please remind your patients and families that hospital visitation restrictions are in effect and the importance of the restrictions.

## 2020-11-12 NOTE — Progress Notes (Signed)
No answer from the pt's listed numbers. Message left for the pt to arrive for surgery tomorrow 11/13/20 at 10:30am to Entrance "A" at [facility].  -Nothing to eat or drink after midnight tonight -Leave all personal belongings at home -Take a shower and brush your teeth before arriving -As of today, stop taking all Aspirin (unless instructed by your doctor) and Other Aspirin containing products, Vitamins, Fish oils, and Herbal medications. Also stop all NSAIDS i.e. Advil, Ibuprofen, Motrin, Aleve, Anaprox, Naproxen, BC, Goody Powders, and all Supplements. - Medicines: -Synthroid, Eye drops, Inhalers-bring day of surgery. As needed: Norco -Someone age 18 and over needs to stay with you for 24 hours after surgery, if you go home the same day. -Wear a mask and social distance if you have to go out prior to your surgery.

## 2020-11-13 ENCOUNTER — Ambulatory Visit (HOSPITAL_COMMUNITY): Payer: 59 | Admitting: Certified Registered Nurse Anesthetist

## 2020-11-13 ENCOUNTER — Ambulatory Visit (HOSPITAL_COMMUNITY)
Admission: RE | Admit: 2020-11-13 | Discharge: 2020-11-13 | Disposition: A | Discharge status: Home / Self Care | Payer: 59 | Source: Ambulatory Visit | Attending: Obstetrics & Gynecology | Admitting: Obstetrics & Gynecology

## 2020-11-13 ENCOUNTER — Encounter (HOSPITAL_COMMUNITY)
Admission: RE | Disposition: A | Discharge status: Home / Self Care | Payer: Self-pay | Source: Ambulatory Visit | Attending: Obstetrics & Gynecology

## 2020-11-13 ENCOUNTER — Encounter (HOSPITAL_COMMUNITY): Payer: Self-pay | Admitting: Obstetrics & Gynecology

## 2020-11-13 DIAGNOSIS — Z87891 Personal history of nicotine dependence: Secondary | ICD-10-CM | POA: Diagnosis not present

## 2020-11-13 DIAGNOSIS — N950 Postmenopausal bleeding: Secondary | ICD-10-CM | POA: Diagnosis present

## 2020-11-13 DIAGNOSIS — N840 Polyp of corpus uteri: Secondary | ICD-10-CM | POA: Diagnosis not present

## 2020-11-13 HISTORY — DX: Fatty (change of) liver, not elsewhere classified: K76.0

## 2020-11-13 HISTORY — DX: Depression, unspecified: F32.A

## 2020-11-13 HISTORY — PX: HYSTEROSCOPY WITH D & C: SHX1775

## 2020-11-13 LAB — CBC
HCT: 42.9 % (ref 36.0–46.0)
Hemoglobin: 14.1 g/dL (ref 12.0–15.0)
MCH: 34.9 pg — ABNORMAL HIGH (ref 26.0–34.0)
MCHC: 32.9 g/dL (ref 30.0–36.0)
MCV: 106.2 fL — ABNORMAL HIGH (ref 80.0–100.0)
Platelets: 355 10*3/uL (ref 150–400)
RBC: 4.04 MIL/uL (ref 3.87–5.11)
RDW: 11.9 % (ref 11.5–15.5)
WBC: 12.5 10*3/uL — ABNORMAL HIGH (ref 4.0–10.5)
nRBC: 0 % (ref 0.0–0.2)

## 2020-11-13 LAB — BASIC METABOLIC PANEL
Anion gap: 11 (ref 5–15)
BUN: 11 mg/dL (ref 6–20)
CO2: 19 mmol/L — ABNORMAL LOW (ref 22–32)
Calcium: 9 mg/dL (ref 8.9–10.3)
Chloride: 107 mmol/L (ref 98–111)
Creatinine, Ser: 0.85 mg/dL (ref 0.44–1.00)
GFR, Estimated: >60 mL/min (ref >60)
Glucose, Bld: 109 mg/dL — ABNORMAL HIGH (ref 70–99)
Potassium: 3.9 mmol/L (ref 3.5–5.1)
Sodium: 137 mmol/L (ref 135–145)

## 2020-11-13 SURGERY — DILATATION AND CURETTAGE /HYSTEROSCOPY
Anesthesia: General

## 2020-11-13 MED ORDER — LIDOCAINE 2% (20 MG/ML) 5 ML SYRINGE
INTRAMUSCULAR | Status: DC | PRN
  Administered 2020-11-13: 60 mg via INTRAVENOUS

## 2020-11-13 MED ORDER — DEXAMETHASONE SODIUM PHOSPHATE 10 MG/ML IJ SOLN
INTRAMUSCULAR | Status: DC | PRN
  Administered 2020-11-13: 10 mg via INTRAVENOUS

## 2020-11-13 MED ORDER — ONDANSETRON HCL 4 MG/2ML IJ SOLN
INTRAMUSCULAR | Status: AC
  Filled 2020-11-13: qty 2

## 2020-11-13 MED ORDER — FENTANYL CITRATE (PF) 100 MCG/2ML IJ SOLN
INTRAMUSCULAR | Status: AC
  Filled 2020-11-13: qty 2

## 2020-11-13 MED ORDER — OXYTOCIN-SODIUM CHLORIDE 30-0.9 UT/500ML-% IV SOLN
INTRAVENOUS | Status: AC
  Filled 2020-11-13: qty 500

## 2020-11-13 MED ORDER — POVIDONE-IODINE 10 % EX SWAB
2.0000 "application " | Freq: Once | CUTANEOUS | Status: AC
  Administered 2020-11-13: 2 via TOPICAL

## 2020-11-13 MED ORDER — ROCURONIUM BROMIDE 10 MG/ML (PF) SYRINGE
PREFILLED_SYRINGE | INTRAVENOUS | Status: AC
  Filled 2020-11-13: qty 10

## 2020-11-13 MED ORDER — LIDOCAINE 2% (20 MG/ML) 5 ML SYRINGE
INTRAMUSCULAR | Status: AC
  Filled 2020-11-13: qty 5

## 2020-11-13 MED ORDER — CEFAZOLIN SODIUM-DEXTROSE 2-4 GM/100ML-% IV SOLN
INTRAVENOUS | Status: AC
  Filled 2020-11-13: qty 100

## 2020-11-13 MED ORDER — FENTANYL CITRATE (PF) 100 MCG/2ML IJ SOLN: 25.0000 ug | INTRAMUSCULAR | Status: DC | PRN

## 2020-11-13 MED ORDER — ONDANSETRON HCL 4 MG/2ML IJ SOLN
INTRAMUSCULAR | Status: DC | PRN
  Administered 2020-11-13: 4 mg via INTRAVENOUS

## 2020-11-13 MED ORDER — LACTATED RINGERS IV SOLN: INTRAVENOUS | Status: DC

## 2020-11-13 MED ORDER — MIDAZOLAM HCL 2 MG/2ML IJ SOLN
INTRAMUSCULAR | Status: AC
  Filled 2020-11-13: qty 2

## 2020-11-13 MED ORDER — FENTANYL CITRATE (PF) 100 MCG/2ML IJ SOLN
25.0000 ug | INTRAMUSCULAR | Status: DC | PRN
  Administered 2020-11-13 (×2): 50 ug via INTRAVENOUS

## 2020-11-13 MED ORDER — PROPOFOL 10 MG/ML IV BOLUS
INTRAVENOUS | Status: DC | PRN
  Administered 2020-11-13: 150 mg via INTRAVENOUS

## 2020-11-13 MED ORDER — DEXAMETHASONE SODIUM PHOSPHATE 10 MG/ML IJ SOLN
INTRAMUSCULAR | Status: AC
  Filled 2020-11-13: qty 1

## 2020-11-13 MED ORDER — LACTATED RINGERS IV SOLN: INTRAVENOUS | Status: DC | PRN

## 2020-11-13 MED ORDER — FENTANYL CITRATE (PF) 250 MCG/5ML IJ SOLN
INTRAMUSCULAR | Status: AC
  Filled 2020-11-13: qty 5

## 2020-11-13 MED ORDER — PROPOFOL 10 MG/ML IV BOLUS
INTRAVENOUS | Status: AC
  Filled 2020-11-13: qty 20

## 2020-11-13 MED ORDER — HYDROCODONE-ACETAMINOPHEN 5-325 MG PO TABS
1.0000 | ORAL_TABLET | Freq: Four times a day (QID) | ORAL | 0 refills | Status: DC | PRN
Start: 2020-11-13 — End: 2020-11-26

## 2020-11-13 MED ORDER — FENTANYL CITRATE (PF) 250 MCG/5ML IJ SOLN
INTRAMUSCULAR | Status: DC | PRN
  Administered 2020-11-13: 50 ug via INTRAVENOUS

## 2020-11-13 MED ORDER — CHLORHEXIDINE GLUCONATE 0.12 % MT SOLN
15.0000 mL | Freq: Once | OROMUCOSAL | Status: AC
  Administered 2020-11-13: 15 mL via OROMUCOSAL
  Filled 2020-11-13: qty 15

## 2020-11-13 MED ORDER — MIDAZOLAM HCL 2 MG/2ML IJ SOLN
INTRAMUSCULAR | Status: DC | PRN
  Administered 2020-11-13: 2 mg via INTRAVENOUS

## 2020-11-13 MED ORDER — ORAL CARE MOUTH RINSE: 15.0000 mL | Freq: Once | OROMUCOSAL | Status: AC

## 2020-11-13 MED ORDER — SODIUM CHLORIDE 0.9 % IR SOLN
Status: DC | PRN
  Administered 2020-11-13: 3000 mL

## 2020-11-13 MED ORDER — SUCCINYLCHOLINE CHLORIDE 200 MG/10ML IV SOSY
PREFILLED_SYRINGE | INTRAVENOUS | Status: AC
  Filled 2020-11-13: qty 20

## 2020-11-13 SURGICAL SUPPLY — 17 items
ABLATOR SURESOUND NOVASURE (ABLATOR) IMPLANT
BIPOLAR CUTTING LOOP 21FR (ELECTRODE)
CATH ROBINSON RED A/P 16FR (CATHETERS) ×2 IMPLANT
ELECT COAG BIPOL BALL 21FR (ELECTRODE) IMPLANT
ELECT REM PT RETURN 9FT ADLT (ELECTROSURGICAL) ×2
ELECTRODE REM PT RTRN 9FT ADLT (ELECTROSURGICAL) ×1 IMPLANT
GLOVE SURG ENC MOIS LTX SZ6 (GLOVE) ×2 IMPLANT
GLOVE SURG UNDER POLY LF SZ6 (GLOVE) ×4 IMPLANT
GLOVE SURG UNDER POLY LF SZ7 (GLOVE) ×2 IMPLANT
GOWN STRL REUS W/ TWL LRG LVL3 (GOWN DISPOSABLE) ×2 IMPLANT
GOWN STRL REUS W/TWL LRG LVL3 (GOWN DISPOSABLE) ×2
KIT PROCEDURE FLUENT (KITS) ×2 IMPLANT
KIT TURNOVER KIT B (KITS) ×2 IMPLANT
LOOP CUTTING BIPOLAR 21FR (ELECTRODE) IMPLANT
PACK VAGINAL MINOR WOMEN LF (CUSTOM PROCEDURE TRAY) ×2 IMPLANT
PAD OB MATERNITY 4.3X12.25 (PERSONAL CARE ITEMS) ×2 IMPLANT
TOWEL GREEN STERILE FF (TOWEL DISPOSABLE) ×4 IMPLANT

## 2020-11-13 NOTE — Transfer of Care (Signed)
Immediate Anesthesia Transfer of Care Note  Patient: Cathy Baxter  Procedure(s) Performed: DILATATION AND CURETTAGE /HYSTEROSCOPY  Patient Location: PACU  Anesthesia Type:General  Level of Consciousness: awake, alert , oriented and patient cooperative  Airway & Oxygen Therapy: Patient Spontanous Breathing and Patient connected to nasal cannula oxygen  Post-op Assessment: Report given to RN, Post -op Vital signs reviewed and stable and Patient moving all extremities X 4  Post vital signs: Reviewed and stable  Last Vitals:  Vitals Value Taken Time  BP 116/75 11/13/20 1344  Temp    Pulse 92 11/13/20 1345  Resp 18 11/13/20 1345  SpO2 100 % 11/13/20 1345  Vitals shown include unvalidated device data.  Last Pain:  Vitals:   11/13/20 1128  PainSc: 0-No pain      Patients Stated Pain Goal: 3 (16/60/63 0160)  Complications: No notable events documented.

## 2020-11-13 NOTE — Anesthesia Procedure Notes (Signed)
Procedure Name: LMA Insertion Date/Time: 11/13/2020 1:12 PM Performed by: Claris Che, CRNA Pre-anesthesia Checklist: Patient identified, Emergency Drugs available, Suction available, Patient being monitored and Timeout performed Patient Re-evaluated:Patient Re-evaluated prior to induction Oxygen Delivery Method: Circle system utilized Preoxygenation: Pre-oxygenation with 100% oxygen Induction Type: IV induction Ventilation: Mask ventilation without difficulty LMA: LMA inserted LMA Size: 4.0 Number of attempts: 1 Placement Confirmation: positive ETCO2 and breath sounds checked- equal and bilateral Tube secured with: Tape Dental Injury: Teeth and Oropharynx as per pre-operative assessment

## 2020-11-13 NOTE — Discharge Instructions (Addendum)
Call MD for T>100.4, heavy vaginal bleeding, severe abdominal pain, intractable nausea and/or vomiting, or respiratory distress.  Call office to schedule postop visit in 2 weeks.  Pelvic rest x 4 weeks.  No driving while taking narcotics.gene

## 2020-11-13 NOTE — Op Note (Signed)
PREOPERATIVE DIAGNOSIS:  60 y.o. with postmenopausal bleeding  POSTOPERATIVE DIAGNOSIS: The same  PROCEDURE: Hysteroscopy, Dilation and Curettage.  SURGEON:  Dr. Linda Hedges  INDICATIONS: 60 y.o. here for scheduled surgery for management and diagnosis of postmenopausal bleeding.   Risks of surgery were discussed with the patient including but not limited to: bleeding which may require transfusion; infection which may require antibiotics; injury to uterus or surrounding organs; intrauterine scarring which may impair future fertility; need for additional procedures including laparotomy or laparoscopy; and other postoperative/anesthesia complications. Written informed consent was obtained.    FINDINGS:  A 6 week size, retroflexed uterus.  Focally thickened endometrium on posterior uterine wall.  Normal ostia bilaterally.  ANESTHESIA:   General  ESTIMATED BLOOD LOSS:  Less than 20 ml  SPECIMENS: Endometrial curettings sent to pathology  COMPLICATIONS:  None immediate.  PROCEDURE DETAILS:  The patient was taken to the operating room where general anesthesia was administered and was found to be adequate.  After an adequate timeout was performed, she was placed in the dorsal lithotomy position and examined; then prepped and draped in the sterile manner.   Her bladder was catheterized for an unmeasured amount of clear, yellow urine. A speculum was then placed in the patient's vagina and a single tooth tenaculum was applied to the anterior lip of the cervix.  The uteus was sounded to 8 cm and dilated manually with metal dilators to accommodate the 5.5 mm diagnostic hysteroscope.  Once the cervix was dilated, the hysteroscope was inserted under direct visualization using saline as a suspension medium.  The uterine cavity was carefully examined, both ostia were recognized. Focal endometrial thickening was noted on the posterior wall of the uterus.  After further careful visualization of the uterine cavity,  the hysteroscope was removed under direct visualization.  A sharp curettage was then performed to obtain a small amount of endometrial curettings.  The tenaculum was removed from the anterior lip of the cervix and the vaginal speculum was removed after noting good hemostasis.  The patient tolerated the procedure well and was taken to the recovery area awake, extubated and in stable condition.

## 2020-11-13 NOTE — Anesthesia Preprocedure Evaluation (Addendum)
Anesthesia Evaluation  Patient identified by MRN, date of birth, ID band Patient awake    Reviewed: Allergy & Precautions, NPO status , Patient's Chart, lab work & pertinent test results  History of Anesthesia Complications Negative for: history of anesthetic complications  Airway Mallampati: II  TM Distance: >3 FB Neck ROM: Full    Dental  (+) Missing, Chipped, Dental Advisory Given,    Pulmonary asthma , former smoker,    Pulmonary exam normal breath sounds clear to auscultation       Cardiovascular negative cardio ROS Normal cardiovascular exam Rhythm:Regular Rate:Normal   Echo 03/26/20: EF 60-65%, no RWMA, normal RVSF, normal PASP, normal AV/MV   Neuro/Psych Anxiety Depression negative neurological ROS     GI/Hepatic Neg liver ROS, hiatal hernia, GERD  ,  Endo/Other  Hypothyroidism   Renal/GU negative Renal ROS  negative genitourinary   Musculoskeletal  (+) Arthritis ,   Abdominal   Peds  Hematology negative hematology ROS (+)   Anesthesia Other Findings   Reproductive/Obstetrics fibroids                            Anesthesia Physical Anesthesia Plan  ASA: 2  Anesthesia Plan: General   Post-op Pain Management:    Induction: Intravenous  PONV Risk Score and Plan: 3 and Ondansetron, Dexamethasone, Midazolam and Treatment may vary due to age or medical condition  Airway Management Planned: LMA  Additional Equipment: None  Intra-op Plan:   Post-operative Plan: Extubation in OR  Informed Consent:   Plan Discussed with:   Anesthesia Plan Comments:         Anesthesia Quick Evaluation

## 2020-11-13 NOTE — Progress Notes (Signed)
No change to H&P.  Cathy Morocho, DO 

## 2020-11-14 ENCOUNTER — Telehealth: Payer: Self-pay | Admitting: Family Medicine

## 2020-11-14 ENCOUNTER — Encounter (HOSPITAL_COMMUNITY): Payer: Self-pay | Admitting: Obstetrics & Gynecology

## 2020-11-14 LAB — SURGICAL PATHOLOGY

## 2020-11-14 NOTE — Telephone Encounter (Signed)
Please advise if pt needs to be seen sooner or added to waitlist

## 2020-11-14 NOTE — Telephone Encounter (Signed)
To my knowledge an emergent appt is not needed. Next available.

## 2020-11-14 NOTE — Telephone Encounter (Signed)
If pt would like she can be placed on waitlist.

## 2020-11-14 NOTE — Telephone Encounter (Signed)
Pt called because she missed an appt the other day because she said she had an emergency and she needed to reschedule. Dr Dierdre Highman next appt available is 11/26/20 but the pt already has an appt with her heart doctor so she scheduled on 12/01/20 at 3 but said she thinks she needs to be seen sooner because when she went for her surgery the labs came back and they weren't good. Would you like to see pt sooner? She said she would need an afternoon and she is going to have them send the lab work over as well

## 2020-11-16 NOTE — Anesthesia Postprocedure Evaluation (Signed)
Anesthesia Post Note  Patient: Talasia Saulter  Procedure(s) Performed: DILATATION AND CURETTAGE /HYSTEROSCOPY     Patient location during evaluation: PACU Anesthesia Type: General Level of consciousness: awake and alert Pain management: pain level controlled Vital Signs Assessment: post-procedure vital signs reviewed and stable Respiratory status: spontaneous breathing, nonlabored ventilation, respiratory function stable and patient connected to nasal cannula oxygen Cardiovascular status: blood pressure returned to baseline and stable Postop Assessment: no apparent nausea or vomiting Anesthetic complications: no   No notable events documented.  Last Vitals:  Vitals:   11/13/20 1400 11/13/20 1415  BP: 120/69 129/76  Pulse: 94 96  Resp: 17 20  Temp:  36.6 C  SpO2: 98% 97%    Last Pain:  Vitals:   11/13/20 1415  PainSc: 3                  Micha Dosanjh L Taniesha Glanz

## 2020-11-17 NOTE — Telephone Encounter (Signed)
Lvm for pt to call back and let us know if she would like to be on the waitlist

## 2020-11-18 ENCOUNTER — Other Ambulatory Visit: Payer: Self-pay

## 2020-11-18 ENCOUNTER — Encounter: Payer: Self-pay | Admitting: Allergy

## 2020-11-18 ENCOUNTER — Ambulatory Visit (INDEPENDENT_AMBULATORY_CARE_PROVIDER_SITE_OTHER): Payer: 59 | Admitting: Allergy

## 2020-11-18 VITALS — BP 130/82 | HR 87 | Temp 98.3°F | Ht 62.0 in | Wt 222.5 lb

## 2020-11-18 DIAGNOSIS — J3089 Other allergic rhinitis: Secondary | ICD-10-CM | POA: Diagnosis not present

## 2020-11-18 DIAGNOSIS — J45909 Unspecified asthma, uncomplicated: Secondary | ICD-10-CM

## 2020-11-18 NOTE — Assessment & Plan Note (Signed)
Past history - Perennial rhinitis symptoms with no specific triggers. 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  Skin testing in 2014 was positive to dust mites, cat, dog, horse, feathers, grass, trees, pigweed, mold.  She was on allergy immunotherapy for less than 1 year.  She also had sinus surgery in 2000. 2022 skin testing showed: Positive to cats, dogs.  Negative to common foods. No pets at home.   Interim history - ENT evaluation (no polyps, empty nose syndrome). Still has nasal congestion.   Continue environmental control measures as below.  Try to take Xyzal (levocetirizine) 1/2 tablet at night.  If it still makes you still drowsy then try:  Over the counter antihistamines such as Claritin (loratadine), Allegra (fexofenadine), or Zyrtec (cetirizine) daily as needed.  Nasal saline spray (i.e., Simply Saline) or nasal saline lavage (i.e., NeilMed) is recommended as needed and prior to medicated nasal sprays.  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   Follow up with glaucoma specialist.  Take Mucinex twice a day as needed with plenty of water.

## 2020-11-18 NOTE — Progress Notes (Signed)
Follow Up Note  RE: Cathy Baxter MRN: [id number] DOB: 02/21/1960 Date of Office Visit: 11/18/2020  Referring provider: Ma Hillock, DO Primary care provider: Ma Hillock, DO  Chief Complaint: Follow-up  History of Present Illness: I had the pleasure of seeing Cathy Baxter for a follow up visit at the [facility] on 11/18/2020. She is a 60 y.o. female, who is being followed for asthma, allergic rhinitis. Her previous allergy office visit was on 08/21/2020 with Dr. Maudie Mercury. Today is a regular follow up visit.  Asthma Patient just started using Breztri a few days ago and not sure if she's doing it the right way as she forgot how to use it.  She was using Advair beforehand.   Denies any coughing, wheezing, chest tightness, nocturnal awakenings, ER/urgent care visits or prednisone use since the last visit.  She had Covid-19 2 months ago.   Patient is scheduled to see a glaucoma specialist but no appointment has been scheduled yet.    Allergic rhinitis Saw ENT - Dr. Benjamine Mola. No polyps, empty nose syndrome.  Currently using benadryl prn since she had rash on her face after her surgery.  Using saline spray and nettipot with unknown benefit.  Still complaining of her sinuses feeling full.   Wants me to review her bloodwork - CBC diff and BMP. Slightly elevated WBC but patient states she was feeling lousy and maybe coming down with URI when this was drawn.    Assessment and Plan: Cathy Baxter is a 60 y.o. female with: Asthma Past history - Issues with her breathing for 20+ years.  Currently on Advair 250 mcg 1 puff twice a day x1 year and albuterol as needed with good benefit.  [name] in the past.  Using albuterol less than once per week.  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  Takes Nexium as needed for reflux.  Seen cardiology as well. Interim history - only started Breztri a few days ago and not sure if using it the right way. Still has shortness  of breath episodes.  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  If no improvement with below regimen, will discuss starting biologics.  Spirometry today showed some restriction - slightly worse than previous one. Daily controller medication(s): Stop Advair.  Start Breztri 111mcg 2 puffs twice a day with spacer and rinse mouth afterwards. Reviewed proper inhaler technique with patient. May use albuterol rescue inhaler 2 puffs every 4 to 6 hours as needed for shortness of breath, chest tightness, coughing, and wheezing. May use albuterol rescue inhaler 2 puffs 5 to 15 minutes prior to strenuous physical activities. Monitor frequency of use.  Get spirometry at next visit.  Other allergic rhinitis Past history - Perennial rhinitis symptoms with no specific triggers. 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  Skin testing in 2014 was positive to dust mites, cat, dog, horse, feathers, grass, trees, pigweed, mold.  She was on allergy immunotherapy for less than 1 year.  She also had sinus surgery in 2000. 2022 skin testing showed: Positive to cats, dogs.  Negative to common foods. No pets at home.   Interim history - ENT evaluation (no polyps, empty nose syndrome). Still has nasal congestion.  Continue environmental control measures as below. Try to take Xyzal (levocetirizine) 1/2 tablet at night. If it still makes you still drowsy then try: Over the counter antihistamines such as Claritin (loratadine), Allegra (fexofenadine), or Zyrtec (cetirizine) daily as needed.  Nasal saline spray (i.e., Simply Saline) or nasal saline lavage (i.e., NeilMed) is recommended as needed and prior to medicated nasal sprays. 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  Follow up with glaucoma specialist. Take Mucinex twice a day as needed with plenty of water.  Return in about 2 months (around 01/18/2021).  No orders of the defined types were placed in this encounter.  Lab Orders   No laboratory test(s) ordered today    Diagnostics: Spirometry:  Tracings reviewed. Her effort: Good reproducible efforts. FVC: 2.06L FEV1: 1.66L, 72% predicted FEV1/FVC ratio: 81% Interpretation: Spirometry consistent with possible restrictive disease.  Please see scanned spirometry results for details.  Medication List:  Current Outpatient Medications  Medication Sig Dispense Refill   albuterol (PROAIR HFA) 108 (90 Base) MCG/ACT inhaler Inhale 2 puffs into the lungs every 6 (six) hours as needed for wheezing or shortness of breath. 1 each 0   atorvastatin (LIPITOR) 20 MG tablet Take 1 tablet (20 mg total) by mouth daily. (Patient taking differently: Take 20 mg by mouth at bedtime.) 90 tablet 3   brimonidine (ALPHAGAN) 0.2 % ophthalmic solution Place 1 drop into both eyes 2 (two) times daily.     Budeson-Glycopyrrol-Formoterol (BREZTRI AEROSPHERE) 160-9-4.8 MCG/ACT AERO Inhale 2 puffs into the lungs in the morning and at bedtime. with spacer and rinse mouth afterwards. 10.7 g 3   Cholecalciferol (VITAMIN D3) 75 MCG (3000 UT) TABS Take 3,000 Units by mouth daily.     dorzolamide-timolol (COSOPT) 22.3-6.8 MG/ML ophthalmic solution Place 1 drop into both eyes 2 (two) times daily.      DULoxetine (CYMBALTA) 30 MG capsule Take 1 capsule (30 mg total) by mouth daily. 90 capsule 1   HYDROcodone-acetaminophen (NORCO/VICODIN) 5-325 MG tablet Take 1 tablet by mouth every 6 (six) hours as needed for moderate pain. 15 tablet 0   ibuprofen (ADVIL) 800 MG tablet Take 800 mg by mouth every 8 (eight) hours as needed for mild pain or moderate pain.     levocetirizine (XYZAL) 5 MG tablet Take 1 tablet (5 mg total) by mouth every evening. (Patient taking differently: Take 2.5 mg by mouth daily as needed for allergies.) 30 tablet 5   levothyroxine (SYNTHROID) 25 MCG tablet Take 1 tablet (25 mcg total) by mouth daily before breakfast. 90 tablet 3   mometasone (ELOCON) 0.1 % cream Apply 1 application  topically daily as needed (ear itching).     progesterone (PROMETRIUM) 200 MG capsule Place 200 mg vaginally at bedtime.     propranolol (INDERAL) 20 MG tablet Take 1 tablet (20 mg total) by mouth at bedtime. 90 tablet 3   vitamin B-12 (CYANOCOBALAMIN) 1000 MCG tablet Take 1,000 mcg by mouth daily.     ADVAIR DISKUS 250-50 MCG/ACT AEPB Inhale 1 puff into the lungs 2 (two) times daily. (Patient not taking: Reported on 11/18/2020)     No current facility-administered medications for this visit.   Allergies: Allergies  Allergen Reactions   Azithromycin Palpitations    Arrhythmia side effects   Oxycodone Shortness Of Breath   Penicillins Anaphylaxis and Itching   Percocet [Oxycodone-Acetaminophen] Shortness Of Breath   Amoxicillin Hives    Has patient had a PCN reaction causing immediate rash, facial/tongue/throat swelling, SOB or lightheadedness with hypotension: Yes Has patient had a PCN reaction causing severe rash involving mucus membranes or skin necrosis: No Has patient had a PCN reaction that required hospitalization No Has patient had a PCN reaction occurring within the last 10 years:  Yes  If all of the above answers are "NO", then may proceed with Cephalosporin use.    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    Demerol [Meperidine] Palpitations   I reviewed her past medical history, social history, family history, and environmental history and no significant changes have been reported from her previous visit.  Review of Systems  Constitutional:  Negative for chills, fever and unexpected weight change.  HENT:  Positive for congestion and sinus pressure. Negative for rhinorrhea.   Eyes:  Negative for itching.  Respiratory:  Positive for shortness of breath. Negative for cough and chest tightness.   Cardiovascular:  Negative for chest pain.  Gastrointestinal:  Negative for abdominal pain.  Genitourinary:  Negative for difficulty urinating.  Skin:   Negative for rash.  Allergic/Immunologic: Positive for environmental allergies.  Neurological:  Positive for headaches.   Objective: BP 130/82   Pulse 87   Temp 98.3 F (36.8 C) (Temporal)   Ht 5\' 2"  (1.575 m)   Wt 222 lb 8 oz (100.9 kg)   SpO2 97%   BMI 40.70 kg/m  Body mass index is 40.7 kg/m. Physical Exam Vitals and nursing note reviewed.  Constitutional:      Appearance: Normal appearance. She is well-developed.  HENT:     Head: Normocephalic and atraumatic.     Right Ear: Tympanic membrane and external ear normal.     Left Ear: Tympanic membrane and external ear normal.     Nose: Nose normal.     Mouth/Throat:     Mouth: Mucous membranes are moist.     Pharynx: Oropharynx is clear.  Eyes:     Conjunctiva/sclera: Conjunctivae normal.  Cardiovascular:     Rate and Rhythm: Normal rate and regular rhythm.     Heart sounds: Normal heart sounds. No murmur heard.   No friction rub. No gallop.  Pulmonary:     Effort: Pulmonary effort is normal.     Breath sounds: Normal breath sounds. No wheezing, rhonchi or rales.  Musculoskeletal:     Cervical back: Neck supple.  Skin:    General: Skin is warm.     Findings: No rash.  Neurological:     Mental Status: She is alert and oriented to person, place, and time.  Psychiatric:        Behavior: Behavior normal.   Previous notes and tests were reviewed. The plan was reviewed with the patient/family, and all questions/concerned were addressed.  It was my pleasure to see Vernita today and participate in her care. Please feel free to contact me with any questions or concerns.  Sincerely,  Rexene Alberts, DO Allergy & Immunology  [facility] office: [place] office: [phone]

## 2020-11-18 NOTE — Patient Instructions (Addendum)
Asthma: Daily controller medication(s): Stop Advair.  Start Breztri 132mcg 2 puffs twice a day with spacer and rinse mouth afterwards. May use albuterol rescue inhaler 2 puffs every 4 to 6 hours as needed for shortness of breath, chest tightness, coughing, and wheezing. May use albuterol rescue inhaler 2 puffs 5 to 15 minutes prior to strenuous physical activities. Monitor frequency of use.  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  Continue environmental control measures as below. Try to take Xyzal (levocetirizine) 1/2 tablet at night. If it still makes you still drowsy then try: Over the counter antihistamines such as Claritin (loratadine), Allegra (fexofenadine), or Zyrtec (cetirizine) daily as needed. Nasal saline spray (i.e., Simply Saline) or nasal saline lavage (i.e., NeilMed) is recommended as needed and prior to medicated nasal sprays. 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  Take Mucinex twice a day as needed with plenty of water.  Follow up in 2 months or sooner if needed.  Follow up with your glaucoma specialist as scheduled.   Pet Allergen Avoidance: Contrary to popular opinion, there are no "hypoallergenic" breeds of dogs or cats. That is because people are not allergic to an animal's hair, but to an allergen found in the animal's saliva, dander (dead skin flakes) or urine. Pet allergy symptoms typically occur within minutes. For some people, symptoms can build up and become most severe 8 to 12 hours after contact with the animal. People with severe allergies can experience reactions in public places if dander has been transported on  the pet owners' clothing. Keeping an animal outdoors is only a partial solution, since homes with pets in the yard still have higher concentrations of animal allergens. Before getting a pet, ask your allergist to determine if you are allergic to animals. If your pet is already considered part of your family, try to minimize contact and keep the pet out of the bedroom and other rooms where you spend a great deal of time. As with dust mites, vacuum carpets often or replace carpet with a hardwood floor, tile or linoleum. High-efficiency particulate air (HEPA) cleaners can reduce allergen levels over time. While dander and saliva are the source of cat and dog allergens, urine is the source of allergens from rabbits, hamsters, mice and [place] pigs; so ask a non-allergic family member to clean the animal's cage. If you have a pet allergy, talk to your allergist about the potential for allergy immunotherapy (allergy shots). This strategy can often provide long-term relief.

## 2020-11-18 NOTE — Assessment & Plan Note (Signed)
Past history - Issues with her breathing for 20+ years.  Currently on Advair 250 mcg 1 puff twice a day x1 year and albuterol as needed with good benefit.  [name] in the past.  Using albuterol less than once per week.  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  Takes Nexium as needed for reflux.  Seen cardiology as well. Interim history - only started Breztri a few days ago and not sure if using it the right way. Still has shortness of breath episodes.   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   If no improvement with below regimen, will discuss starting biologics.   Spirometry today showed some restriction - slightly worse than previous one. . Daily controller medication(s): Stop Advair.  o Start Breztri 187mcg 2 puffs twice a day with spacer and rinse mouth afterwards. o Reviewed proper inhaler technique with patient. . May use albuterol rescue inhaler 2 puffs every 4 to 6 hours as needed for shortness of breath, chest tightness, coughing, and wheezing. May use albuterol rescue inhaler 2 puffs 5 to 15 minutes prior to strenuous physical activities. Monitor frequency of use.  . Get spirometry at next visit.

## 2020-11-26 ENCOUNTER — Other Ambulatory Visit: Payer: Self-pay

## 2020-11-26 ENCOUNTER — Ambulatory Visit (INDEPENDENT_AMBULATORY_CARE_PROVIDER_SITE_OTHER): Payer: 59 | Admitting: Cardiology

## 2020-11-26 ENCOUNTER — Encounter: Payer: Self-pay | Admitting: Cardiology

## 2020-11-26 VITALS — BP 116/80 | HR 74 | Ht 63.0 in | Wt 222.4 lb

## 2020-11-26 DIAGNOSIS — I471 Supraventricular tachycardia, unspecified: Secondary | ICD-10-CM | POA: Insufficient documentation

## 2020-11-26 DIAGNOSIS — I491 Atrial premature depolarization: Secondary | ICD-10-CM

## 2020-11-26 DIAGNOSIS — E785 Hyperlipidemia, unspecified: Secondary | ICD-10-CM | POA: Diagnosis not present

## 2020-11-26 DIAGNOSIS — E669 Obesity, unspecified: Secondary | ICD-10-CM

## 2020-11-26 DIAGNOSIS — I10 Essential (primary) hypertension: Secondary | ICD-10-CM | POA: Insufficient documentation

## 2020-11-26 MED ORDER — ATORVASTATIN CALCIUM 20 MG PO TABS: 20.0000 mg | ORAL_TABLET | Freq: Every day | ORAL | 3 refills | Status: DC

## 2020-11-26 NOTE — Progress Notes (Signed)
Cardiology Office Note:    Date:  11/27/2020   ID:  Cathy Baxter, DOB 02/08/1960, MRN [id number]  PCP:  Cathy Hillock, DO  Cardiologist:  Berniece Salines, DO  Electrophysiologist:  None   Referring MD: Cathy Hillock, DO   " I am doing well"  History of Present Illness:    Cathy Baxter is a 60 y.o. female with a hx of hypothyroidism, hiatal hernia, obesity is here today for follow-up visit.  I saw the patient on February 2022 at that time she was experiencing palpitations and shortness of breath.  She also complained of daytime somnolence.  At the time I placed a monitor on the patient to make sure he had she was not experiencing any significant arrhythmia.  An echocardiogram was also ordered.  We had been able to keep with the patient testing result to her.  Based on the paroxysmal SVT I recommended the patient take propanolol but the patient preferred to do this on a as needed basis.  I do not think this is unreasonable because she is now only experiencing occasional symptoms.  She is here today for follow-up visit she tells me that overall she has been doing well.  She still does have some intermittent palpitations..  She has not started taking the propanolol she preferred to take that on a as needed basis but has not needed it.  Past Medical History:  Diagnosis Date   Allergy    Anemia    Arthritis    Asthma    Chicken pox    Depression    Fatty liver    GERD (gastroesophageal reflux disease)    Hiatal hernia    Hyperlipidemia    Pigmentary glaucoma of both eyes    Polyp of stomach    Shingles    Thyroid disease    Urinary incontinence    Uterine fibroid    Uterine fibroids in pregnancy, postpartum condition 11/06/2015    Past Surgical History:  Procedure Laterality Date   CHOLECYSTECTOMY  2002   EYE SURGERY     HYSTEROSCOPY WITH D & C N/A 11/13/2020   Procedure: DILATATION AND CURETTAGE /HYSTEROSCOPY;  Surgeon: Cathy Hedges, DO;  Location: [facility];  Service:  Gynecology;  Laterality: N/A;  rep Vicente Males to cover case confirmed on 11/10/20 CS   KNEE ARTHROSCOPY Left 08/2015   NASAL SINUS SURGERY  2000    Current Medications: Current Meds  Medication Sig   ADVAIR DISKUS 250-50 MCG/ACT AEPB Inhale 1 puff into the lungs 2 (two) times daily.   albuterol (PROAIR HFA) 108 (90 Base) MCG/ACT inhaler Inhale 2 puffs into the lungs every 6 (six) hours as needed for wheezing or shortness of breath.   brimonidine (ALPHAGAN) 0.2 % ophthalmic solution Place 1 drop into both eyes 2 (two) times daily.   Budeson-Glycopyrrol-Formoterol (BREZTRI AEROSPHERE) 160-9-4.8 MCG/ACT AERO Inhale 2 puffs into the lungs in the morning and at bedtime. with spacer and rinse mouth afterwards.   Cholecalciferol (VITAMIN D3) 75 MCG (3000 UT) TABS Take 3,000 Units by mouth daily.   dorzolamide-timolol (COSOPT) 22.3-6.8 MG/ML ophthalmic solution Place 1 drop into both eyes 2 (two) times daily.    ibuprofen (ADVIL) 800 MG tablet Take 800 mg by mouth every 8 (eight) hours as needed for mild pain or moderate pain.   levocetirizine (XYZAL) 5 MG tablet Take 1 tablet (5 mg total) by mouth every evening. (Patient taking differently: Take 2.5 mg by mouth daily as needed for allergies.)   levothyroxine (  SYNTHROID) 25 MCG tablet Take 1 tablet (25 mcg total) by mouth daily before breakfast.   propranolol (INDERAL) 20 MG tablet Take 1 tablet (20 mg total) by mouth at bedtime.   vitamin B-12 (CYANOCOBALAMIN) 1000 MCG tablet Take 1,000 mcg by mouth daily.   [DISCONTINUED] atorvastatin (LIPITOR) 20 MG tablet Take 1 tablet (20 mg total) by mouth daily. (Patient taking differently: Take 20 mg by mouth at bedtime.)     Allergies:   Azithromycin, Oxycodone, Penicillins, Percocet [oxycodone-acetaminophen], Amoxicillin, Doxycycline, Prednisone, and Demerol [meperidine]   Social History   Socioeconomic History   Marital status: Divorced    Spouse name: Not on file   Number of children: Not on file   Years  of education: 11   Highest education level: Not on file  Occupational History   Occupation: stock  Tobacco Use   Smoking status: Former    Packs/day: 3.00    Years: 12.00    Pack years: 36.00    Types: Cigarettes    Quit date: 07/12/1983    Years since quitting: 37.4   Smokeless tobacco: Never  Vaping Use   Vaping Use: Never used  Substance and Sexual Activity   Alcohol use: Not Currently    Comment: once every 6 months    Drug use: No   Sexual activity: Not on file  Other Topics Concern   Not on file  Social History Narrative   Divorced. She has had 3 children, one passed away young from cancer.    11th grade education. Works at [employer].    Former smoker.   Drinks caffeine.    Wears seatbelt, smoke detector in the home.    Firearms in the home.    Feels safe in her relationships.    Social Determinants of Health   Financial Resource Strain: Not on file  Food Insecurity: Not on file  Transportation Needs: Not on file  Physical Activity: Not on file  Stress: Not on file  Social Connections: Not on file     Family History: The patient's family history includes Asthma in her mother; Breast cancer (age of onset: 33) in her sister; COPD in her mother; Cancer (age of onset: 62) in her son; Celiac disease in her daughter; Early death in her father; Heart disease in her mother. There is no history of Allergic rhinitis, Eczema, Urticaria, Angioedema, Immunodeficiency, or Atopy.  ROS:   Review of Systems  Constitution: Negative for decreased appetite, fever and weight gain.  HENT: Negative for congestion, ear discharge, hoarse voice and sore throat.   Eyes: Negative for discharge, redness, vision loss in right eye and visual halos.  Cardiovascular: Reports palpitations. Negative for chest pain, dyspnea on exertion, leg swelling, orthopnea. Respiratory: Negative for cough, hemoptysis, shortness of breath and snoring.   Endocrine: Negative for heat intolerance and polyphagia.   Hematologic/Lymphatic: Negative for bleeding problem. Does not bruise/bleed easily.  Skin: Negative for flushing, nail changes, rash and suspicious lesions.  Musculoskeletal: Negative for arthritis, joint pain, muscle cramps, myalgias, neck pain and stiffness.  Gastrointestinal: Negative for abdominal pain, bowel incontinence, diarrhea and excessive appetite.  Genitourinary: Negative for decreased libido, genital sores and incomplete emptying.  Neurological: Negative for brief paralysis, focal weakness, headaches and loss of balance.  Psychiatric/Behavioral: Negative for altered mental status, depression and suicidal ideas.  Allergic/Immunologic: Negative for HIV exposure and persistent infections.    EKGs/Labs/Other Studies Reviewed:    The following studies were reviewed today:   EKG:  None today  Echo  03/26/2020 IMPRESSIONS   1. GLS: -21.9. Left ventricular ejection fraction, by estimation, is 60  to 65%. The left ventricle has normal function. The left ventricle has no  regional wall motion abnormalities. Left ventricular diastolic parameters  were normal.   2. Right ventricular systolic function is normal. The right ventricular  size is normal. There is normal pulmonary artery systolic pressure.   3. The mitral valve is normal in structure. No evidence of mitral valve  regurgitation. No evidence of mitral stenosis.   4. The aortic valve is normal in structure. Aortic valve regurgitation is  not visualized. No aortic stenosis is present.   5. The inferior vena cava is normal in size with greater than 50%  respiratory variability, suggesting right atrial pressure of 3 mmHg.   FINDINGS   Left Ventricle: GLS: -21.9. Left ventricular ejection fraction, by  estimation, is 60 to 65%. The left ventricle has normal function. The left  ventricle has no regional wall motion abnormalities. The left ventricular  internal cavity size was normal in  size. There is no left ventricular  hypertrophy. Left ventricular diastolic  parameters were normal.   Right Ventricle: The right ventricular size is normal. No increase in  right ventricular wall thickness. Right ventricular systolic function is  normal. There is normal pulmonary artery systolic pressure. The tricuspid  regurgitant velocity is 2.71 m/s, and   with an assumed right atrial pressure of 3 mmHg, the estimated right  ventricular systolic pressure is 46.5 mmHg.   Left Atrium: Left atrial size was normal in size.   Right Atrium: Right atrial size was normal in size.   Pericardium: There is no evidence of pericardial effusion.   Mitral Valve: The mitral valve is normal in structure. No evidence of  mitral valve regurgitation. No evidence of mitral valve stenosis.   Tricuspid Valve: The tricuspid valve is normal in structure. Tricuspid  valve regurgitation is mild . No evidence of tricuspid stenosis.   Aortic Valve: The aortic valve is normal in structure. Aortic valve  regurgitation is not visualized. No aortic stenosis is present.   Pulmonic Valve: The pulmonic valve was normal in structure. Pulmonic valve  regurgitation is not visualized. No evidence of pulmonic stenosis.   Aorta: The aortic root is normal in size and structure.   Venous: The inferior vena cava is normal in size with greater than 50%  respiratory variability, suggesting right atrial pressure of 3 mmHg.   IAS/Shunts: No atrial level shunt detected by    [facility] The patient wore the monitor for 7 days starting February 29, 2020. Indication: Palpitations   The minimum heart rate was 53 bpm, maximum heart rate was 200 bpm, and average heart rate was 77 bpm. Predominant underlying rhythm was Sinus Rhythm.   4 Supraventricular Tachycardia runs occurred, the run with the fastest interval lasting 9 beats with a max rate of 200 bpm (average 176 bpm); the run with the fastest interval was also the longest.     Premature atrial complexes were  rare less than 1%. Premature Ventricular complexes were rare less than 1%.   No ventricular tachycardia, no pauses, No AV block and no atrial fibrillation present. 10 patient triggered events 1 associated with supraventricular tachycardia and the remaining associated with premature atrial complex in sinus rhythm.  6 diary events are associated with premature atrial complexes sinus rhythm   Conclusion: This study is remarkable for the following:  1.  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                                         2.  Rare symptomatic premature atrial complexes  Recent Labs: 12/12/2019: TSH 2.88 09/07/2020: ALT 37 11/13/2020: BUN 11; Creatinine, Ser 0.85; Hemoglobin 14.1; Platelets 355; Potassium 3.9; Sodium 137  Recent Lipid Panel    Component Value Date/Time   CHOL 184 11/26/2020 1424   TRIG 89 11/26/2020 1424   HDL 57 11/26/2020 1424   CHOLHDL 3.2 11/26/2020 1424   LDLCALC 111 (H) 11/26/2020 1424    Physical Exam:    VS:  BP 116/80 (BP Location: Left Arm, Patient Position: Sitting, Cuff Size: Large)   Pulse 74   Ht 5\' 3"  (1.6 m)   Wt 222 lb 6.4 oz (100.9 kg)   SpO2 96%   BMI 39.40 kg/m     Wt Readings from Last 3 Encounters:  11/26/20 222 lb 6.4 oz (100.9 kg)  11/18/20 222 lb 8 oz (100.9 kg)  11/13/20 220 lb (99.8 kg)     GEN: Well nourished, well developed in no acute distress HEENT: Normal NECK: No JVD; No carotid bruits LYMPHATICS: No lymphadenopathy CARDIAC: S1S2 noted,RRR, no murmurs, rubs, gallops RESPIRATORY:  Clear to auscultation without rales, wheezing or rhonchi  ABDOMEN: Soft, non-tender, non-distended, +bowel sounds, no guarding. EXTREMITIES: No edema, No cyanosis, no clubbing MUSCULOSKELETAL:  No deformity  SKIN: Warm and dry NEUROLOGIC:  Alert and oriented x 3, non-focal PSYCHIATRIC:  Normal affect, good insight  ASSESSMENT:    1. PSVT (paroxysmal supraventricular  tachycardia) ([place])   2. PAC (premature atrial contraction)   3. Obesity (BMI 30-39.9)   4. Hyperlipidemia, unspecified hyperlipidemia type    PLAN:     She is doing well from CV standpoint.  We have agreed she will continue her propanolol as needed for paroxysmal SVT. In terms of her hyperlipidemia she continues to take the Lipitor 20 mg daily.  We will get a repeat lipid profile with LP(a). The patient understands the need to lose weight with diet and exercise. We have discussed specific strategies for this.  The patient is in agreement with the above plan. The patient left the office in stable condition.  The patient will follow up in 1 year or sooner if needed.   Medication Adjustments/Labs and Tests Ordered: Current medicines are reviewed at length with the patient today.  Concerns regarding medicines are outlined above.  Orders Placed This Encounter  Procedures   Lipid panel   Lipoprotein A (LPA)    Meds ordered this encounter  Medications   atorvastatin (LIPITOR) 20 MG tablet    Sig: Take 1 tablet (20 mg total) by mouth at bedtime.    Dispense:  90 tablet    Refill:  3     Patient Instructions  Medication Instructions:  Your physician recommends that you continue on your current medications as directed. Please refer to the Current Medication list given to you today.  Refilled Lipitor *If you need a refill on your cardiac medications before your next appointment, please call your pharmacy*   Lab Work: Your physician recommends that you return for lab work in:  TODAY: Lipids, Lp(a) If you have labs (blood work) drawn today and your tests are completely normal, you will receive your results only by: [place] (if you have [place])  OR A paper copy in the mail If you have any lab test that is abnormal or we need to change your treatment, we will call you to review the results.   Testing/Procedures: None   Follow-Up: At [facility], you and your health  needs are our priority.  As part of our continuing mission to provide you with exceptional heart care, we have created designated Provider Care Teams.  These Care Teams include your primary Cardiologist (physician) and Advanced Practice Providers (APPs -  Physician Assistants and Nurse Practitioners) who all work together to provide you with the care you need, when you need it.  We recommend signing up for the patient portal called "MyChart".  Sign up information is provided on this After Visit Summary.  MyChart is used to connect with patients for Virtual Visits (Telemedicine).  Patients are able to view lab/test results, encounter notes, upcoming appointments, etc.  Non-urgent messages can be sent to your provider as well.   To learn more about what you can do with MyChart, go to [email or web address].    Your next appointment:   1 year(s)  The format for your next appointment:   In Person  Provider:   Berniece Salines, DO     Other Instructions     Adopting a Healthy Lifestyle.  Know what a healthy weight is for you (roughly BMI <25) and aim to maintain this   Aim for 7+ servings of fruits and vegetables daily   65-80+ fluid ounces of water or unsweet tea for healthy kidneys   Limit to max 1 drink of alcohol per day; avoid smoking/tobacco   Limit animal fats in diet for cholesterol and heart health - choose grass fed whenever available   Avoid highly processed foods, and foods high in saturated/trans fats   Aim for low stress - take time to unwind and care for your mental health   Aim for 150 min of moderate intensity exercise weekly for heart health, and weights twice weekly for bone health   Aim for 7-9 hours of sleep daily   When it comes to diets, agreement about the perfect plan isnt easy to find, even among the experts. Experts at the [place] developed an idea known as the Healthy Eating Plate. Just imagine a plate divided into logical, healthy  portions.   The emphasis is on diet quality:   Load up on vegetables and fruits - one-half of your plate: Aim for color and variety, and remember that potatoes dont count.   Go for whole grains - one-quarter of your plate: Whole wheat, barley, wheat berries, quinoa, oats, brown rice, and foods made with them. If you want pasta, go with whole wheat pasta.   Protein power - one-quarter of your plate: Fish, chicken, beans, and nuts are all healthy, versatile protein sources. Limit red meat.   The diet, however, does go beyond the plate, offering a few other suggestions.   Use healthy plant oils, such as olive, canola, soy, corn, sunflower and peanut. Check the labels, and avoid partially hydrogenated oil, which have unhealthy trans fats.   If youre thirsty, drink water. Coffee and tea are good in moderation, but skip sugary drinks and limit milk and dairy products to one or two daily servings.   The type of carbohydrate in the diet is more important than the amount. Some sources of carbohydrates, such as vegetables, fruits, whole grains, and beans-are healthier than others.   Finally, stay active  Signed,  Berniece Salines, DO  11/27/2020 7:44 AM    [facility]

## 2020-11-26 NOTE — Patient Instructions (Addendum)
Medication Instructions:  Your physician recommends that you continue on your current medications as directed. Please refer to the Current Medication list given to you today.  Refilled Lipitor *If you need a refill on your cardiac medications before your next appointment, please call your pharmacy*   Lab Work: Your physician recommends that you return for lab work in:  TODAY: Lipids, Lp(a) If you have labs (blood work) drawn today and your tests are completely normal, you will receive your results only by: [place] (if you have [place]) OR A paper copy in the mail If you have any lab test that is abnormal or we need to change your treatment, we will call you to review the results.   Testing/Procedures: None   Follow-Up: At [facility], you and your health needs are our priority.  As part of our continuing mission to provide you with exceptional heart care, we have created designated Provider Care Teams.  These Care Teams include your primary Cardiologist (physician) and Advanced Practice Providers (APPs -  Physician Assistants and Nurse Practitioners) who all work together to provide you with the care you need, when you need it.  We recommend signing up for the patient portal called "MyChart".  Sign up information is provided on this After Visit Summary.  MyChart is used to connect with patients for Virtual Visits (Telemedicine).  Patients are able to view lab/test results, encounter notes, upcoming appointments, etc.  Non-urgent messages can be sent to your provider as well.   To learn more about what you can do with MyChart, go to [email or web address].    Your next appointment:   1 year(s)  The format for your next appointment:   In Person  Provider:   Berniece Salines, DO     Other Instructions

## 2020-11-27 LAB — LIPID PANEL
Chol/HDL Ratio: 3.2 ratio (ref 0.0–4.4)
Cholesterol, Total: 184 mg/dL (ref 100–199)
HDL: 57 mg/dL (ref >39)
LDL Chol Calc (NIH): 111 mg/dL — ABNORMAL HIGH (ref 0–99)
Triglycerides: 89 mg/dL (ref 0–149)
VLDL Cholesterol Cal: 16 mg/dL (ref 5–40)

## 2020-11-27 LAB — LIPOPROTEIN A (LPA): Lipoprotein (a): 13.9 nmol/L (ref <75.0)

## 2020-12-01 ENCOUNTER — Encounter: Payer: Self-pay | Admitting: Family Medicine

## 2020-12-01 ENCOUNTER — Other Ambulatory Visit: Payer: Self-pay

## 2020-12-01 ENCOUNTER — Ambulatory Visit (INDEPENDENT_AMBULATORY_CARE_PROVIDER_SITE_OTHER): Payer: 59 | Admitting: Family Medicine

## 2020-12-01 VITALS — BP 104/70 | HR 73 | Temp 98.0°F | Ht 63.0 in | Wt 225.0 lb

## 2020-12-01 DIAGNOSIS — R591 Generalized enlarged lymph nodes: Secondary | ICD-10-CM | POA: Diagnosis not present

## 2020-12-01 DIAGNOSIS — D7589 Other specified diseases of blood and blood-forming organs: Secondary | ICD-10-CM

## 2020-12-01 MED ORDER — SULFAMETHOXAZOLE-TRIMETHOPRIM 800-160 MG PO TABS: 1.0000 | ORAL_TABLET | Freq: Two times a day (BID) | ORAL | 0 refills | Status: DC

## 2020-12-01 NOTE — Patient Instructions (Signed)
Great to see you today.  I have refilled the medication(s) we provide.   If labs were collected, we will inform you of lab results once received either by echart message or telephone call.   - echart message- for normal results that have been seen by the patient already.   - telephone call: abnormal results or if patient has not viewed results in their echart.  

## 2020-12-01 NOTE — Progress Notes (Signed)
This visit occurred during the SARS-CoV-2 public health emergency.  Safety protocols were in place, including screening questions prior to the visit, additional usage of staff PPE, and extensive cleaning of exam room while observing appropriate contact time as indicated for disinfecting solutions.    Cathy Baxter , 03/03/1960, 60 y.o., female MRN: [id number] Patient Care Team    Relationship Specialty Notifications Start End  Ma Hillock, DO PCP - General Family Medicine  08/30/16   Berniece Salines, DO PCP - Cardiology Cardiology  11/26/20   Philemon Kingdom, MD Consulting Physician Internal Medicine  02/20/16    Comment: endocrine  Linda Hedges, [place] Physician Obstetrics and Gynecology  02/20/16   Mcarthur Rossetti, MD Consulting Physician Orthopedic Surgery  07/11/18   Wylene Simmer, MD Consulting Physician Orthopedic Surgery  02/27/20   Paralee Cancel, MD Consulting Physician Orthopedic Surgery  02/27/20     Chief Complaint  Patient presents with   Facial Swelling    Pt c/o neck swelling and was informed swollen lymph nodes x 1 mo;     Subjective: Pt presents for an OV with complaints of large lymph node underneath the left side of her jaw.  She states it has been present a little over a month.  She first noted when she woke up to have surgery earlier this month.  Her anesthesiologist felt her lymph nodes that morning and agreed her lymph node was enlarged.  She states it has remained enlarged and tender over this time.  Patient does have chronic sinus problems.  She denies any teeth pain.  She had a mildly elevated white count during that appointment as well.  She also has rather significantly elevated MCV during that appointment. Mammogram and colonoscopy are reported up-to-date.  Pap smear is up-to-date.  Depression screen [facility] 2/9 12/01/2020 12/12/2019 07/25/2018 06/01/2017 02/20/2016  Decreased Interest 0 3 0 0 0  Down, Depressed, Hopeless 0 3 0 0 0  PHQ - 2 Score 0 6 0  0 0  Altered sleeping - 3 - - -  Tired, decreased energy - 3 - - -  Change in appetite - 3 - - -  Feeling bad or failure about yourself  - 3 - - -  Trouble concentrating - 3 - - -  Moving slowly or fidgety/restless - 0 - - -  Suicidal thoughts - 0 - - -  PHQ-9 Score - 21 - - -    Allergies  Allergen Reactions   Azithromycin Palpitations    Arrhythmia side effects   Oxycodone Shortness Of Breath   Penicillins Anaphylaxis and Itching   Percocet [Oxycodone-Acetaminophen] Shortness Of Breath   Amoxicillin Hives    Has patient had a PCN reaction causing immediate rash, facial/tongue/throat swelling, SOB or lightheadedness with hypotension: Yes Has patient had a PCN reaction causing severe rash involving mucus membranes or skin necrosis: No Has patient had a PCN reaction that required hospitalization No Has patient had a PCN reaction occurring within the last 10 years: Yes  If all of the above answers are "NO", then may proceed with Cephalosporin use.    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    Demerol [Meperidine] Palpitations   Social History   Social History Narrative   Divorced. She has had 3 children, one passed away young from cancer.    11th grade education. Works at [employer].    Former smoker.   Drinks caffeine.    Wears seatbelt,  smoke detector in the home.    Firearms in the home.    Feels safe in her relationships.    Past Medical History:  Diagnosis Date   Allergy    Anemia    Arthritis    Asthma    Chicken pox    Depression    Fatty liver    GERD (gastroesophageal reflux disease)    Hiatal hernia    Hyperlipidemia    Pigmentary glaucoma of both eyes    Polyp of stomach    Shingles    Thyroid disease    Urinary incontinence    Uterine fibroid    Uterine fibroids in pregnancy, postpartum condition 11/06/2015   Past Surgical History:  Procedure Laterality Date   CHOLECYSTECTOMY  2002   EYE SURGERY     HYSTEROSCOPY WITH D &  C N/A 11/13/2020   Procedure: DILATATION AND CURETTAGE /HYSTEROSCOPY;  Surgeon: Linda Hedges, DO;  Location: [facility];  Service: Gynecology;  Laterality: N/A;  rep Vicente Males to cover case confirmed on 11/10/20 CS   KNEE ARTHROSCOPY Left 08/2015   NASAL SINUS SURGERY  2000   Family History  Problem Relation Age of Onset   Heart disease Mother    Asthma Mother    COPD Mother    Early death Father    Cancer Son 18       Neuroepithelioma    Breast cancer Sister 79   Celiac disease Daughter    Allergic rhinitis Neg Hx    Eczema Neg Hx    Urticaria Neg Hx    Angioedema Neg Hx    Immunodeficiency Neg Hx    Atopy Neg Hx    Allergies as of 12/01/2020       Reactions   Azithromycin Palpitations   Arrhythmia side effects   Oxycodone Shortness Of Breath   Penicillins Anaphylaxis, Itching   Percocet [oxycodone-acetaminophen] Shortness Of Breath   Amoxicillin Hives   Has patient had a PCN reaction causing immediate rash, facial/tongue/throat swelling, SOB or lightheadedness with hypotension: Yes Has patient had a PCN reaction causing severe rash involving mucus membranes or skin necrosis: No Has patient had a PCN reaction that required hospitalization No Has patient had a PCN reaction occurring within the last 10 years: Yes  If all of the above answers are "NO", then may proceed with Cephalosporin use.   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    Demerol [meperidine] Palpitations        Medication List        Accurate as of December 01, 2020  3:00 PM. If you have any questions, ask your nurse or doctor.          Advair Diskus 250-50 MCG/ACT Aepb Generic drug: fluticasone-salmeterol Inhale 1 puff into the lungs 2 (two) times daily.   albuterol 108 (90 Base) MCG/ACT inhaler Commonly known as: ProAir HFA Inhale 2 puffs into the lungs every 6 (six) hours as needed for wheezing or shortness of breath.   atorvastatin 20 MG tablet Commonly known as:  LIPITOR Take 1 tablet (20 mg total) by mouth at bedtime.   Breztri Aerosphere 160-9-4.8 MCG/ACT Aero Generic drug: Budeson-Glycopyrrol-Formoterol Inhale 2 puffs into the lungs in the morning and at bedtime. with spacer and rinse mouth afterwards.   brimonidine 0.2 % ophthalmic solution Commonly known as: ALPHAGAN Place 1 drop into both eyes 2 (two) times daily.   dorzolamide-timolol 22.3-6.8 MG/ML ophthalmic solution Commonly known as: COSOPT Place 1 drop into  both eyes 2 (two) times daily.   ibuprofen 800 MG tablet Commonly known as: ADVIL Take 800 mg by mouth every 8 (eight) hours as needed for mild pain or moderate pain.   levocetirizine 5 MG tablet Commonly known as: Xyzal Take 1 tablet (5 mg total) by mouth every evening. What changed:  how much to take when to take this reasons to take this   levothyroxine 25 MCG tablet Commonly known as: SYNTHROID Take 1 tablet (25 mcg total) by mouth daily before breakfast.   propranolol 20 MG tablet Commonly known as: INDERAL Take 1 tablet (20 mg total) by mouth at bedtime.   vitamin B-12 1000 MCG tablet Commonly known as: CYANOCOBALAMIN Take 1,000 mcg by mouth daily.   Vitamin D3 75 MCG (3000 UT) Tabs Take 3,000 Units by mouth daily.        All past medical history, surgical history, allergies, family history, immunizations andmedications were updated in the EMR today and reviewed under the history and medication portions of their EMR.     ROS: Negative, with the exception of above mentioned in HPI   Objective:  BP 104/70   Pulse 73   Temp 98 F (36.7 C) (Oral)   Ht 5\' 3"  (1.6 m)   Wt 225 lb (102.1 kg)   SpO2 98%   BMI 39.86 kg/m  Body mass index is 39.86 kg/m. Gen: Afebrile. No acute distress. Nontoxic in appearance, well developed, well nourished.  HENT: AT. [place]. Bilateral TM visualized with in normal limits. . MMM, no oral lesions. Bilateral nares with mild drainage. Throat without erythema or exudates.   Mild cough present. Eyes:Pupils Equal Round Reactive to light, Extraocular movements intact,  Conjunctiva without redness, discharge or icterus. Neck/lymp/endocrine: Supple,~1.5 cm enlarged lymph node present left submandibular.  Tender to palpation.  No thyromegaly. Chest: CTAB, no wheeze or crackles. Good air movement, normal resp effort.   Neuro: Normal gait. PERLA. EOMi. Alert. Oriented x3  Psych: Normal affect, dress and demeanor. Normal speech. Normal thought content and judgment.  No results found. No results found. No results found for this or any previous visit (from the past 24 hour(s)).  Assessment/Plan: Kinya Meine is a 60 y.o. female present for OV for  Lymphadenopathy/Macrocytosis Discussed treating with antibiotic prophylaxis and seeing if there is improvement as well as repeating her CBC, B12, TSH for potential causes of macrocytosis.  Her preventative health maintenance/cancer screenings are up-to-date. Elected to treat prophylactically with Bactrim and obtain ultrasound of neck to rule out pathological lymphadenopathy. - Pathologist smear review - B12 - CBC w/Diff - TSH - US Soft Tissue Head/Neck (NON-THYROID); Future    Reviewed expectations re: course of current medical issues. Discussed self-management of symptoms. Outlined signs and symptoms indicating need for more acute intervention. Patient verbalized understanding and all questions were answered. Patient received an After-Visit Summary.    No orders of the defined types were placed in this encounter.  No orders of the defined types were placed in this encounter.  Referral Orders  No referral(s) requested today     Note is dictated utilizing voice recognition software. Although note has been proof read prior to signing, occasional typographical errors still can be missed. If any questions arise, please do not hesitate to call for verification.   electronically signed by:  Howard Pouch, DO   [place]

## 2020-12-02 LAB — CBC WITH DIFFERENTIAL/PLATELET
Absolute Monocytes: 912 cells/uL (ref 200–950)
Basophils Absolute: 76 cells/uL (ref 0–200)
Basophils Relative: 0.8 %
Eosinophils Absolute: 561 cells/uL — ABNORMAL HIGH (ref 15–500)
Eosinophils Relative: 5.9 %
HCT: 37.4 % (ref 35.0–45.0)
Hemoglobin: 13.1 g/dL (ref 11.7–15.5)
Lymphs Abs: 2898 cells/uL (ref 850–3900)
MCH: 34.7 pg — ABNORMAL HIGH (ref 27.0–33.0)
MCHC: 35 g/dL (ref 32.0–36.0)
MCV: 98.9 fL (ref 80.0–100.0)
MPV: 9.8 fL (ref 7.5–12.5)
Monocytes Relative: 9.6 %
Neutro Abs: 5054 cells/uL (ref 1500–7800)
Neutrophils Relative %: 53.2 %
Platelets: 381 10*3/uL (ref 140–400)
RBC: 3.78 10*6/uL — ABNORMAL LOW (ref 3.80–5.10)
RDW: 11.8 % (ref 11.0–15.0)
Total Lymphocyte: 30.5 %
WBC: 9.5 10*3/uL (ref 3.8–10.8)

## 2020-12-02 LAB — TSH: TSH: 1.56 mIU/L (ref 0.40–4.50)

## 2020-12-02 LAB — VITAMIN B12: Vitamin B-12: 400 pg/mL (ref 200–1100)

## 2020-12-02 LAB — PATHOLOGIST SMEAR REVIEW

## 2020-12-08 ENCOUNTER — Other Ambulatory Visit: Payer: Self-pay

## 2020-12-08 ENCOUNTER — Ambulatory Visit (HOSPITAL_BASED_OUTPATIENT_CLINIC_OR_DEPARTMENT_OTHER)
Admission: RE | Admit: 2020-12-08 | Discharge: 2020-12-08 | Disposition: A | Discharge status: Home / Self Care | Payer: 59 | Source: Ambulatory Visit | Attending: Family Medicine | Admitting: Family Medicine

## 2020-12-08 DIAGNOSIS — R591 Generalized enlarged lymph nodes: Secondary | ICD-10-CM | POA: Insufficient documentation

## 2020-12-09 ENCOUNTER — Other Ambulatory Visit: Payer: Self-pay

## 2020-12-09 MED ORDER — LEVOCETIRIZINE DIHYDROCHLORIDE 5 MG PO TABS: 5.0000 mg | ORAL_TABLET | Freq: Every evening | ORAL | 1 refills | Status: DC

## 2021-02-03 ENCOUNTER — Ambulatory Visit: Payer: 59 | Admitting: Allergy

## 2021-03-03 ENCOUNTER — Other Ambulatory Visit: Payer: Self-pay | Admitting: Family Medicine

## 2021-03-05 ENCOUNTER — Telehealth: Payer: Self-pay | Admitting: Family Medicine

## 2021-03-05 MED ORDER — LEVOTHYROXINE SODIUM 25 MCG PO TABS: 25.0000 ug | ORAL_TABLET | Freq: Every day | ORAL | 0 refills | Status: DC

## 2021-03-05 NOTE — Telephone Encounter (Signed)
Pt is aware she will need appt for further refills. 30 d/s sent

## 2021-03-05 NOTE — Telephone Encounter (Signed)
[place] told pt provider denied medication.Pt wanting to know why?  levothyroxine levothyroxine (SYNTHROID) 25 MCG tablet  [place] cell: [phone]  Pt doesn't have insurance currently, she is working on [employer].

## 2021-04-15 ENCOUNTER — Ambulatory Visit (INDEPENDENT_AMBULATORY_CARE_PROVIDER_SITE_OTHER): Payer: No Typology Code available for payment source | Admitting: Family Medicine

## 2021-04-15 ENCOUNTER — Encounter: Payer: Self-pay | Admitting: Family Medicine

## 2021-04-15 VITALS — BP 135/84 | HR 74 | Temp 97.5°F | Ht 63.0 in | Wt 224.0 lb

## 2021-04-15 DIAGNOSIS — E034 Atrophy of thyroid (acquired): Secondary | ICD-10-CM | POA: Diagnosis not present

## 2021-04-15 MED ORDER — LEVOTHYROXINE SODIUM 25 MCG PO TABS: 25.0000 ug | ORAL_TABLET | Freq: Every day | ORAL | 0 refills | Status: DC

## 2021-04-15 NOTE — Addendum Note (Signed)
Addended by: Beryle Lathe S on: 04/15/2021 03:38 PM ? ? Modules accepted: Orders ? ?

## 2021-04-15 NOTE — Progress Notes (Signed)
? ? ? ?This visit occurred during the SARS-CoV-2 public health emergency.  Safety protocols were in place, including screening questions prior to the visit, additional usage of staff PPE, and extensive cleaning of exam room while observing appropriate contact time as indicated for disinfecting solutions.  ? ? ?Cathy Baxter , Oct 04, 1960, 61 y.o., female ?MRN: [id number] ?Patient Care Team  ?  Relationship Specialty Notifications Start End  ?Ma Hillock, DO PCP - General Family Medicine  08/30/16   ?Berniece Salines, DO PCP - Cardiology Cardiology  11/26/20   ?Philemon Kingdom, MD Consulting Physician Internal Medicine  02/20/16   ? Comment: endocrine  ?Linda Hedges, DO Consulting Physician Obstetrics and Gynecology  02/20/16   ?Mcarthur Rossetti, MD Consulting Physician Orthopedic Surgery  07/11/18   ?Wylene Simmer, MD Consulting Physician Orthopedic Surgery  02/27/20   ?Paralee Cancel, MD Consulting Physician Orthopedic Surgery  02/27/20   ? ? ?Chief Complaint  ?Patient presents with  ? Hypothyroidism  ?  Cmc; pt is not fasting  ? ?  ?Subjective: Pt presents for an OV to follow up on  [facility] ?Hypothyroidism, unspecified type/obesity ?Pt reports compliance of levothyroxine 25 mcg.  TSH levels are due today ? ? ? ?  04/15/2021  ?  3:03 PM 12/01/2020  ?  2:52 PM 12/12/2019  ? 10:13 AM 07/25/2018  ?  1:41 PM 06/01/2017  ?  2:18 PM  ?Depression screen PHQ 2/9  ?Decreased Interest 1 0 3 0 0  ?Down, Depressed, Hopeless 1 0 3 0 0  ?PHQ - 2 Score 2 0 6 0 0  ?Altered sleeping 1  3    ?Tired, decreased energy 3  3    ?Change in appetite 0  3    ?Feeling bad or failure about yourself  1  3    ?Trouble concentrating 2  3    ?Moving slowly or fidgety/restless 0  0    ?Suicidal thoughts 0  0    ?PHQ-9 Score 9  21    ? ? ?Allergies  ?Allergen Reactions  ? Azithromycin Palpitations  ?  Arrhythmia side effects  ? Oxycodone Shortness Of Breath  ? Penicillins Anaphylaxis and Itching  ? Percocet [Oxycodone-Acetaminophen] Shortness Of Breath  ?  Amoxicillin Hives  ?  Has patient had a PCN reaction causing immediate rash, facial/tongue/throat swelling, SOB or lightheadedness with hypotension: Yes ?Has patient had a PCN reaction causing severe rash involving mucus membranes or skin necrosis: No ?Has patient had a PCN reaction that required hospitalization No ?Has patient had a PCN reaction occurring within the last 10 years: Yes  ?If all of the above answers are "NO", then may proceed with Cephalosporin use. ?  ? Doxycycline Itching  ? Prednisone   ?  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   ? Demerol [Meperidine] Palpitations  ? ?Social History  ? ?Social History Narrative  ? Divorced. She has had 3 children, one passed away young from cancer.   ? 11th grade education. Works at [employer].   ? Former smoker.  ? Drinks caffeine.   ? Wears seatbelt, smoke detector in the home.   ? Firearms in the home.   ? Feels safe in her relationships.   ? ?Past Medical History:  ?Diagnosis Date  ? Allergy   ? Anemia   ? Arthritis   ? Asthma   ? Chicken pox   ? Depression   ? Fatty liver   ? GERD (gastroesophageal reflux disease)   ? Hiatal hernia   ?  Hyperlipidemia   ? Pigmentary glaucoma of both eyes   ? Polyp of stomach   ? Shingles   ? Thyroid disease   ? Urinary incontinence   ? Uterine fibroid   ? Uterine fibroids in pregnancy, postpartum condition 11/06/2015  ? ?Past Surgical History:  ?Procedure Laterality Date  ? CHOLECYSTECTOMY  2002  ? EYE SURGERY    ? HYSTEROSCOPY WITH D & C N/A 11/13/2020  ? Procedure: DILATATION AND CURETTAGE /HYSTEROSCOPY;  Surgeon: Linda Hedges, DO;  Location: [facility];  Service: Gynecology;  Laterality: N/A;  rep Vicente Males to cover case confirmed on 11/10/20 CS  ? KNEE ARTHROSCOPY Left 08/2015  ? NASAL SINUS SURGERY  2000  ? ?Family History  ?Problem Relation Age of Onset  ? Heart disease Mother   ? Asthma Mother   ? COPD Mother   ? Early death Father   ? Cancer Son 10  ?     Neuroepithelioma   ? Breast cancer Sister 73  ? Celiac disease Daughter   ? Allergic  rhinitis Neg Hx   ? Eczema Neg Hx   ? Urticaria Neg Hx   ? Angioedema Neg Hx   ? Immunodeficiency Neg Hx   ? Atopy Neg Hx   ? ?Allergies as of 04/15/2021   ? ?   Reactions  ? Azithromycin Palpitations  ? Arrhythmia side effects  ? Oxycodone Shortness Of Breath  ? Penicillins Anaphylaxis, Itching  ? Percocet [oxycodone-acetaminophen] Shortness Of Breath  ? Amoxicillin Hives  ? Has patient had a PCN reaction causing immediate rash, facial/tongue/throat swelling, SOB or lightheadedness with hypotension: Yes ?Has patient had a PCN reaction causing severe rash involving mucus membranes or skin necrosis: No ?Has patient had a PCN reaction that required hospitalization No ?Has patient had a PCN reaction occurring within the last 10 years: Yes  ?If all of the above answers are "NO", then may proceed with Cephalosporin use.  ? Doxycycline Itching  ? Prednisone   ? 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   ? Demerol [meperidine] Palpitations  ? ?  ? ?  ?Medication List  ?  ? ?  ? Accurate as of April 15, 2021  3:29 PM. If you have any questions, ask your nurse or doctor.  ?  ?  ? ?  ? ?STOP taking these medications   ? ?albuterol 108 (90 Base) MCG/ACT inhaler ?Commonly known as: ProAir HFA ?Stopped by: Howard Pouch, DO ?  ?ibuprofen 800 MG tablet ?Commonly known as: ADVIL ?Stopped by: Howard Pouch, DO ?  ?sulfamethoxazole-trimethoprim 800-160 MG tablet ?Commonly known as: BACTRIM DS ?Stopped by: Howard Pouch, DO ?  ? ?  ? ?TAKE these medications   ? ?Advair Diskus 250-50 MCG/ACT Aepb ?Generic drug: fluticasone-salmeterol ?Inhale 1 puff into the lungs 2 (two) times daily. ?  ?atorvastatin 20 MG tablet ?Commonly known as: LIPITOR ?Take 1 tablet (20 mg total) by mouth at bedtime. ?  ?Breztri Aerosphere 160-9-4.8 MCG/ACT Aero ?Generic drug: Budeson-Glycopyrrol-Formoterol ?Inhale 2 puffs into the lungs in the morning and at bedtime. with spacer and rinse mouth afterwards. ?  ?brimonidine 0.2 % ophthalmic solution ?Commonly known as:  ALPHAGAN ?Place 1 drop into both eyes 2 (two) times daily. ?  ?diclofenac 75 MG EC tablet ?Commonly known as: VOLTAREN ?Take 75 mg by mouth 2 (two) times daily. ?  ?dorzolamide-timolol 22.3-6.8 MG/ML ophthalmic solution ?Commonly known as: COSOPT ?Place 1 drop into both eyes 2 (two) times daily. ?  ?levocetirizine 5 MG tablet ?Commonly known as: Xyzal ?Take 1  tablet (5 mg total) by mouth every evening. ?  ?levothyroxine 25 MCG tablet ?Commonly known as: SYNTHROID ?Take 1 tablet (25 mcg total) by mouth daily before breakfast. ?  ?predniSONE 5 MG tablet ?Commonly known as: DELTASONE ?Take by mouth as directed. ?  ?propranolol 20 MG tablet ?Commonly known as: INDERAL ?Take 1 tablet (20 mg total) by mouth at bedtime. ?  ?vitamin B-12 1000 MCG tablet ?Commonly known as: CYANOCOBALAMIN ?Take 1,000 mcg by mouth daily. ?  ?Vitamin D3 75 MCG (3000 UT) Tabs ?Take 3,000 Units by mouth daily. ?  ? ?  ? ? ?All past medical history, surgical history, allergies, family history, immunizations andmedications were updated in the EMR today and reviewed under the history and medication portions of their EMR.    ? ?ROS: Negative, with the exception of above mentioned in HPI ? ? ?Objective:  ?BP 135/84   Pulse 74   Temp (!) 97.5 ?F (36.4 ?C) (Oral)   Ht '5\' 3"'$  (1.6 m)   Wt 224 lb (101.6 kg)   SpO2 94%   BMI 39.68 kg/m?  ?Body mass index is 39.68 kg/m?Mar[facility] Kitchen ?Physical Exam ?Vitals and nursing note reviewed.  ?Constitutional:   ?   General: She is not in acute distress. ?   Appearance: Normal appearance. She is not ill-appearing, toxic-appearing or diaphoretic.  ?HENT:  ?   Head: Normocephalic and atraumatic.  ?Eyes:  ?   General: No scleral icterus.    ?   Right eye: No discharge.     ?   Left eye: No discharge.  ?   Extraocular Movements: Extraocular movements intact.  ?   Conjunctiva/sclera: Conjunctivae normal.  ?   Pupils: Pupils are equal, round, and reactive to light.  ?Neck:  ?   Comments: No thyromegaly ?Cardiovascular:  ?   Rate  and Rhythm: Normal rate and regular rhythm.  ?   Heart sounds: No murmur heard. ?Musculoskeletal:  ?   Cervical back: Neck supple. No tenderness.  ?Lymphadenopathy:  ?   Cervical: No cervical adenopathy.  ?Skin: ?   General

## 2021-04-16 ENCOUNTER — Other Ambulatory Visit: Payer: Self-pay | Admitting: Family Medicine

## 2021-04-16 LAB — T4, FREE: Free T4: 1.4 ng/dL (ref 0.8–1.8)

## 2021-04-16 LAB — TSH: TSH: 0.96 mIU/L (ref 0.40–4.50)

## 2021-04-16 MED ORDER — LEVOTHYROXINE SODIUM 25 MCG PO TABS: 25.0000 ug | ORAL_TABLET | Freq: Every day | ORAL | 3 refills | Status: DC

## 2021-04-20 DIAGNOSIS — M25561 Pain in right knee: Secondary | ICD-10-CM | POA: Insufficient documentation

## 2021-05-06 ENCOUNTER — Ambulatory Visit (INDEPENDENT_AMBULATORY_CARE_PROVIDER_SITE_OTHER): Payer: No Typology Code available for payment source | Admitting: Orthopaedic Surgery

## 2021-05-06 DIAGNOSIS — S83241D Other tear of medial meniscus, current injury, right knee, subsequent encounter: Secondary | ICD-10-CM

## 2021-05-06 NOTE — Progress Notes (Signed)
The patient comes in today for evaluation treatment of right knee pain and a known medial meniscal tear.  She has an MRI that accompanies her as well.  She has had a steroid injection with no effect on the right knee.  She hurts on the medial aspect and there is locking catching with the right knee. ? ?On exam she has significant right knee medial tenderness.  There is good range of motion of the knee and ankle stable ligamentously but there is an effusion.  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 ? ?I did review the MRI of the right knee and there is signal changes all throughout the medial meniscus at the posterior aspect of mid body. ? ?At this point we recommended arthroscopic intervention for her right knee given the failure of conservative treatment including activity modification, rest, anti-inflammatories, quad strengthening exercises, weight loss and a steroid injection.  Given the clinical exam findings and MRI findings of right knee it is appropriate to perform arthroscopic surgery on this knee.  We have actually performed arthroscopic surgery on her left knee remotely.  She is fully aware of what the surgery involves.  I described what the surgery involves again and talked about the risk and benefits of surgery.  All question concerns were answered and addressed.  We will work on getting this scheduled soon and then we will see her back in 1 week postoperative. ?

## 2021-05-19 ENCOUNTER — Other Ambulatory Visit: Payer: Self-pay | Admitting: Family Medicine

## 2021-05-20 ENCOUNTER — Telehealth: Payer: Self-pay | Admitting: Cardiology

## 2021-05-20 NOTE — Telephone Encounter (Signed)
Patient informed of the following from PharmD..."Would suggest she stop atorvastatin for 2-3 weeks.  If she is feeling better at that time, try a reduced dose, 10 mg just three days per week (MWF) to see if she can tolerate that without side effects." ?Patient repeated these instructions in her own words. She was concerned about having an MI or CVA during the 2-3 weeks. I recommended that if she has any chest discomfort, headache, weakness on one side of the body to call 911. ?

## 2021-05-20 NOTE — Telephone Encounter (Signed)
Patient reports feeling tired, weak,and sleeps a lot. Her muscles ache. She believes it is from the Lipitor. She stated her PCP is aware of how she feels. Please advise. ?

## 2021-05-20 NOTE — Telephone Encounter (Signed)
Pt c/o medication issue: ? ?1. Name of Medication: atorvastatin (LIPITOR) 20 MG tablet ? ?2. How are you currently taking this medication (dosage and times per day)? Take 1 tablet (20 mg total) by mouth at bedtime. ? ?3. Are you having a reaction (difficulty breathing--STAT)?  ? ?4. What is your medication issue? Patient states she has been feel weak and tired. Make her constipated. Gives her leg cramps, body aches.  She is wondering if she can get off the medication, she states she didn't start to feels this way until she start to take the medication.  ?

## 2021-05-20 NOTE — Telephone Encounter (Signed)
Would suggest she stop atorvastatin for 2-3 weeks.  If she is feeling better at that time, try a reduced dose, 10 mg just three days per week (MWF) to see if she can tolerate that without side effects.  ?

## 2021-07-06 ENCOUNTER — Telehealth: Payer: Self-pay | Admitting: Cardiology

## 2021-07-06 ENCOUNTER — Other Ambulatory Visit: Payer: Self-pay

## 2021-07-06 DIAGNOSIS — E785 Hyperlipidemia, unspecified: Secondary | ICD-10-CM

## 2021-07-06 NOTE — Telephone Encounter (Signed)
Pt c/o medication issue:  1. Name of Medication:   atorvastatin (LIPITOR) 20 MG tablet (Expired)    2. How are you currently taking this medication (dosage and times per day)? Take 1 tablet (20 mg total) by mouth at bedtime.  3. Are you having a reaction (difficulty breathing--STAT)? No  4. What is your medication issue? Pt would like to know if she is still suppose to be taking medication and if so what dosage. Please advise

## 2021-07-06 NOTE — Progress Notes (Signed)
Lab orders placed for 6-10 week lab draw.

## 2021-07-06 NOTE — Telephone Encounter (Signed)
Spoke with pt. She states she has been taking Lipitor 10 mg once daily Monday, Wednesday and Friday as instructed. She reports improved symptoms. "It's not as bad as it was." She wants to know how is she supposed to take it from not on. Will get message to pharmacy for review.

## 2021-07-15 NOTE — Telephone Encounter (Signed)
Spoke with pt, she reports she restarted the lipitor at 1/2 dose and it is still causing problems and she has stopped the medication and wants something different. Looks like she has also tried crestor in the past and had problems. Aware will forward to dr tobb to review and advise

## 2021-07-15 NOTE — Telephone Encounter (Signed)
Spoke with pt, aware of dr tobb's recommendations. Samples placed at the front desk and Follow up scheduled with the pharmacist.

## 2021-07-15 NOTE — Telephone Encounter (Signed)
Pt c/o medication issue:  1. Name of Medication:   atorvastatin (LIPITOR) 20 MG tablet (Expired)  2. How are you currently taking this medication (dosage and times per day)?   As prescribed  3. Are you having a reaction (difficulty breathing--STAT)?  No  4. What is your medication issue?    Patient called stating the the medication is making her sick to her stomach and aching all over.  She said she is taking the half pill and it is giving her the same issue as when she was taking the whole pill.  Patient would like an alternate medication.

## 2021-07-15 NOTE — Telephone Encounter (Signed)
Please refer her to the lipid clinic. In the meantime please have her start Livalo 4 mg daily.

## 2021-07-22 ENCOUNTER — Ambulatory Visit (INDEPENDENT_AMBULATORY_CARE_PROVIDER_SITE_OTHER): Payer: No Typology Code available for payment source | Admitting: Pharmacist

## 2021-07-22 ENCOUNTER — Encounter: Payer: Self-pay | Admitting: Pharmacist

## 2021-07-22 DIAGNOSIS — E7849 Other hyperlipidemia: Secondary | ICD-10-CM

## 2021-07-22 MED ORDER — LIVALO 4 MG PO TABS: 1.0000 | ORAL_TABLET | Freq: Every day | ORAL | 0 refills | Status: DC

## 2021-07-22 NOTE — Patient Instructions (Addendum)
It was nice meeting you today  We would like your LDL (bad cholesterol) to be less than 70  I recommend starting Livalo '4mg'$  once a day  Please increase your physical activity to at least 30 minutes a day  Try to work on diet changes.  Increase vegetables, lean proteins, and whole grains.  Avoid processed foods and sugars  If you have side effects from the Livalo please let us know and we can start the injectable medicine  Please call or message with any questions  Karren Cobble, PharmD, [place], [place], [place], [place], [place], [address] Phone: [phone], Fax: [phone]

## 2021-07-22 NOTE — Progress Notes (Signed)
Patient ID: Cathy Baxter                 DOB: [birth year]                    MRN: [id number]     HPI: Cathy Baxter is a 61 y.o. female patient referred to lipid clinic by Dr Harriet Masson. PMH is significant for asthma, HTN, and HLD.  Patient was intolerant to atorvastatin.    Patient currently being treated at ortho for right knee pain. Having trouble walking.  Patient presents today with medications. Reports atorvastatin made her extremely drowsy and she would sleep for 18 hours a day. Was able to tolerate taking atorvastatin '10mg'$  three times weekly but then symptoms resumed.  LDL decreased from 167 to 111 in 6 months on atorvastatin.  Last lipid panel drawn 11/2020.  Has samples of Livalo '4mg'$  but has not started yet because she is worried about side effects.  Has not been on any cholesterol medications in about 2 weeks.  Current Medications:  Livalo '4mg'$  (has not started)  Intolerances:  Atorvastatin Rosuvastatin   Labs: TC 184, Trigs 89, HDL 57, LDL 111 (11/26/20 on atorvastatin)  Past Medical History:  Diagnosis Date   Allergy    Anemia    Arthritis    Asthma    Chicken pox    Depression    Fatty liver    GERD (gastroesophageal reflux disease)    Hiatal hernia    Hyperlipidemia    Pigmentary glaucoma of both eyes    Polyp of stomach    Shingles    Thyroid disease    Urinary incontinence    Uterine fibroid    Uterine fibroids in pregnancy, postpartum condition 11/06/2015    Current Outpatient Medications on File Prior to Visit  Medication Sig Dispense Refill   ADVAIR DISKUS 250-50 MCG/ACT AEPB Inhale 1 puff into the lungs 2 (two) times daily.     albuterol (VENTOLIN HFA) 108 (90 Base) MCG/ACT inhaler TAKE 2 PUFFS BY MOUTH EVERY 6 HOURS AS NEEDED FOR WHEEZE OR SHORTNESS OF BREATH     atorvastatin (LIPITOR) 20 MG tablet Take 1 tablet (20 mg total) by mouth at bedtime. 90 tablet 3   brimonidine (ALPHAGAN) 0.2 % ophthalmic solution Place 1 drop into both eyes 2 (two) times  daily.     Budeson-Glycopyrrol-Formoterol (BREZTRI AEROSPHERE) 160-9-4.8 MCG/ACT AERO Inhale 2 puffs into the lungs in the morning and at bedtime. with spacer and rinse mouth afterwards. 10.7 g 3   Cholecalciferol (VITAMIN D3) 75 MCG (3000 UT) TABS Take 3,000 Units by mouth daily.     clindamycin (CLEOCIN) 300 MG capsule TAKE 2 CAPSULES TO START THEN 1 CAP EVERY 6 HOURS UNTIL FINISHED     diclofenac (VOLTAREN) 75 MG EC tablet Take 75 mg by mouth 2 (two) times daily.     dorzolamide-timolol (COSOPT) 22.3-6.8 MG/ML ophthalmic solution Place 1 drop into both eyes 2 (two) times daily.      fexofenadine (ALLEGRA) 180 MG tablet Take 1 tablet by mouth daily.     HYDROcodone-acetaminophen (NORCO/VICODIN) 5-325 MG tablet Take 1 tablet by mouth every 6 (six) hours as needed.     ibuprofen (ADVIL) 800 MG tablet Take 1 tablet by mouth every 8 (eight) hours as needed.     levocetirizine (XYZAL) 5 MG tablet Take 1 tablet (5 mg total) by mouth every evening. 30 tablet 1   levothyroxine (SYNTHROID) 25 MCG tablet Take 1 tablet (25 mcg total)  by mouth daily before breakfast. 90 tablet 3   misoprostol (CYTOTEC) 200 MCG tablet INSERT ONE TABLET PER VAGINA THE NIGHT PRIOR TO PROCEDURE     mometasone (ELOCON) 0.1 % cream APPLY TO EXTERNAL EAR ONCE DAILY AS NEEDED ITCHING     predniSONE (DELTASONE) 5 MG tablet Take by mouth as directed.     progesterone (PROMETRIUM) 200 MG capsule TAKE 1 CAPSULE BY MOUTH EVERY DAY AT BEDTIME     propranolol (INDERAL) 20 MG tablet Take 1 tablet (20 mg total) by mouth at bedtime. 90 tablet 3   rosuvastatin (CRESTOR) 5 MG tablet Take 1 tablet by mouth daily.     sulfamethoxazole-trimethoprim (BACTRIM DS) 800-160 MG tablet Take 1 tablet by mouth 2 (two) times daily.     vitamin B-12 (CYANOCOBALAMIN) 1000 MCG tablet Take 1,000 mcg by mouth daily.     No current facility-administered medications on file prior to visit.    Allergies  Allergen Reactions   Azithromycin Palpitations     Arrhythmia side effects   Oxycodone Shortness Of Breath   Penicillins Anaphylaxis and Itching   Percocet [Oxycodone-Acetaminophen] Shortness Of Breath   Amoxicillin Hives    Has patient had a PCN reaction causing immediate rash, facial/tongue/throat swelling, SOB or lightheadedness with hypotension: Yes Has patient had a PCN reaction causing severe rash involving mucus membranes or skin necrosis: No Has patient had a PCN reaction that required hospitalization No Has patient had a PCN reaction occurring within the last 10 years: Yes  If all of the above answers are "NO", then may proceed with Cephalosporin use.    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    Demerol [Meperidine] Palpitations    Assessment/Plan:  1. Hyperlipidemia - Patient last LDL 111 however this was when she was taking atorvastatin.  Has been off for 2 weeks and had been taking 3x weekly. Unknown what current level is.  Advised I could not predict if she would have adverse effects to pitavastatin but it would be beneficial if she tried it.    Gave options of other medications such as Zetia and PCSK9i.  Patient hesitant to self inject.  Trained patient on pen regardless in case it was needed in the future.  Explained mechanism of action, storage, site selection, administration and possible adverse effects.  Patient will start pitavastatin '4mg'$  daily and report back if there are adverse effects.  Advised patient she should increase physical exercise to at least 30 minutes a day and focus diet on vegetables, lean proteins, and whole grains.  Patient voiced understanding.  Lipid panel previously placed.  Karren Cobble, PharmD, BCACP, [place], [place], [place], [place], [address] Phone: [phone], Fax: [phone]

## 2021-11-05 ENCOUNTER — Encounter: Payer: Self-pay | Admitting: Allergy

## 2021-11-05 ENCOUNTER — Ambulatory Visit: Payer: Commercial Managed Care - HMO | Admitting: Allergy

## 2021-11-05 VITALS — BP 120/78 | HR 72 | Resp 18

## 2021-11-05 DIAGNOSIS — J3089 Other allergic rhinitis: Secondary | ICD-10-CM | POA: Diagnosis not present

## 2021-11-05 DIAGNOSIS — R490 Dysphonia: Secondary | ICD-10-CM | POA: Diagnosis not present

## 2021-11-05 DIAGNOSIS — J4540 Moderate persistent asthma, uncomplicated: Secondary | ICD-10-CM | POA: Diagnosis not present

## 2021-11-05 DIAGNOSIS — K219 Gastro-esophageal reflux disease without esophagitis: Secondary | ICD-10-CM | POA: Diagnosis not present

## 2021-11-05 MED ORDER — ALBUTEROL SULFATE HFA 108 (90 BASE) MCG/ACT IN AERS: 2.0000 | INHALATION_SPRAY | RESPIRATORY_TRACT | 1 refills | Status: AC | PRN

## 2021-11-05 MED ORDER — FLUTICASONE-SALMETEROL 250-50 MCG/ACT IN AEPB
1.0000 | INHALATION_SPRAY | Freq: Two times a day (BID) | RESPIRATORY_TRACT | 2 refills | Status: DC
Start: 2021-11-05 — End: 2022-03-18

## 2021-11-05 MED ORDER — OMEPRAZOLE 20 MG PO CPDR: 20.0000 mg | DELAYED_RELEASE_CAPSULE | Freq: Every day | ORAL | 2 refills | Status: DC

## 2021-11-05 NOTE — Assessment & Plan Note (Signed)
Having issues with voice hoarseness and not sure when this started. She does mention having reflux symptoms at times and not taking anything daily for this.  See handout for lifestyle and dietary modifications.  Start omeprazole '20mg'$  daily in the mornings - nothing to eat or drink for 30 minutes afterwards. . If no improvement with above regimen will refer to Dr. Rowe Clack (ENT who specializes in vocal cords).

## 2021-11-05 NOTE — Assessment & Plan Note (Signed)
Past history - Issues with her breathing for 20+ years.  Currently on Advair 250 mcg 1 puff twice a day x1 year and albuterol as needed with good benefit.  Cathy Baxter in the past.  Using albuterol less than once per week.  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  Takes Nexium as needed for reflux.  Seen cardiology as well. Interim history - Cathy Baxter is not more effective than Advair and since started 6 months ago noted some pain with inhalation at times. Denies albuterol use.   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  Marland Kitchen Spirometry today showed some restriction. . Daily controller medication(s): stop Breztri. o Take Advair 248mg 1 puff twice a day and rinse mouth after each use.  . May use albuterol rescue inhaler 2 puffs every 4 to 6 hours as needed for shortness of breath, chest tightness, coughing, and wheezing. May use albuterol rescue inhaler 2 puffs 5 to 15 minutes prior to strenuous physical activities. Monitor frequency of use.  . Get spirometry at next visit. . Consider biologics next.

## 2021-11-05 NOTE — Assessment & Plan Note (Signed)
Past history - Perennial rhinitis symptoms with no specific triggers. 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  Skin testing in 2014 was positive to dust mites, cat, dog, horse, feathers, grass, trees, pigweed, mold.  She was on allergy immunotherapy for less than 1 year.  She also had sinus surgery in 2000. 2022 skin testing showed: Positive to cats, dogs.  Negative to common foods. No pets at home.  ENT evaluation (no polyps, empty nose syndrome). Interim history -  Still has nasal congestion and xyzal causes drowsiness.   Continue environmental control measures as below.  Try to take Claritin (loratadine) 1/2 tablet daily or zyrtec 1/2 tablet daily.  Nasal saline spray (i.e., Simply Saline) or nasal saline lavage (i.e., NeilMed) is recommended as needed and prior to medicated nasal sprays.  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

## 2021-11-05 NOTE — Progress Notes (Signed)
Follow Up Note  RE: Cathy Baxter MRN: [id number] DOB: 13-Oct-1960 Date of Office Visit: 11/05/2021  Referring provider: Ma Hillock, DO Primary care provider: Ma Hillock, DO  Chief Complaint: Asthma  History of Present Illness: I had the pleasure of seeing Cathy Baxter for a follow up visit at the [facility] on 11/05/2021. She is a 61 y.o. female, who is being followed for asthma, allergic rhinitis. Her previous allergy office visit was on 11/18/2020 with Dr. Maudie Mercury. Today is a regular follow up visit.  Asthma A few hours after she takes Breztri she noticed some chest pain when inhaling in. This started about 6 months ago when she switched from Advair to [place]. This lasts for awhile but not sure how long.  She is still taking Breztri now and has not noticed improvement in her breathing over taking Advair.   Denies any ER/urgent care visits or prednisone use since the last visit.  Patient has some voice hoarseness and not sure when this started.   Patient takes Prilosec as needed only but sometimes she wakes up in the middle of the night due to heartburn.    Allergic rhinitis Taking xyzal 1/2 tablet at night but still making her sleepy. Nasal congestion and using Flonase prn - discussed that she should not be using it due to her glaucoma. Apparently her eye doctor told her that no change with her glaucoma.   Assessment and Plan: Cathy Baxter is a 61 y.o. female with: Asthma Past history - Issues with her breathing for 20+ years.  Currently on Advair 250 mcg 1 puff twice a day x1 year and albuterol as needed with good benefit.  [name] in the past.  Using albuterol less than once per week.  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  Takes Nexium as needed for reflux.  Seen cardiology as well. Interim history - Cathy Baxter is not more effective than Advair and since started 6 months ago noted some pain with inhalation at times. Denies albuterol use.   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  Spirometry today showed some restriction. Daily controller medication(s): stop Breztri. Take Advair 233mg 1 puff twice a day and rinse mouth after each use.  May use albuterol rescue inhaler 2 puffs every 4 to 6 hours as needed for shortness of breath, chest tightness, coughing, and wheezing. May use albuterol rescue inhaler 2 puffs 5 to 15 minutes prior to strenuous physical activities. Monitor frequency of use.  Get spirometry at next visit. Consider biologics next.   Voice hoarseness Having issues with voice hoarseness and not sure when this started. She does mention having reflux symptoms at times and not taking anything daily for this. See handout for lifestyle and dietary modifications. Start omeprazole '20mg'$  daily in the mornings - nothing to eat or drink for 30 minutes afterwards. If no improvement with above regimen will refer to Dr. MRowe Clack(ENT who specializes in vocal cords).   Other allergic rhinitis Past history - Perennial rhinitis symptoms with no specific triggers. 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  Skin testing in 2014 was positive to dust mites, cat, dog, horse, feathers, grass, trees, pigweed, mold.  She was on allergy immunotherapy for less than 1 year.  She also had sinus surgery in 2000. 2022 skin testing showed: Positive to cats, dogs.  Negative to common foods. No pets at home.  ENT evaluation (no polyps, empty nose syndrome). Interim history -  Still has nasal congestion and xyzal causes drowsiness.  Continue environmental control measures as below. Try to take Claritin (loratadine) 1/2 tablet daily or zyrtec 1/2 tablet daily. Nasal saline spray (i.e., Simply Saline) or nasal saline lavage (i.e., NeilMed) is recommended as needed and prior to medicated nasal sprays. 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   Return in about 2 months (around 01/05/2022).  Meds ordered this  encounter  Medications   fluticasone-salmeterol (ADVAIR) 250-50 MCG/ACT AEPB    Sig: Inhale 1 puff into the lungs in the morning and at bedtime. Rinse mouth after each use.    Dispense:  60 each    Refill:  2   albuterol (VENTOLIN HFA) 108 (90 Base) MCG/ACT inhaler    Sig: Inhale 2 puffs into the lungs every 4 (four) hours as needed for wheezing or shortness of breath (coughing fits).    Dispense:  18 g    Refill:  1   omeprazole (PRILOSEC) 20 MG capsule    Sig: Take 1 capsule (20 mg total) by mouth daily. Nothing to eat or drink for 30 minutes.    Dispense:  30 capsule    Refill:  2   Lab Orders  No laboratory test(s) ordered today    Diagnostics: Spirometry:  Tracings reviewed. Her effort: Good reproducible efforts. FVC: 2.34L FEV1: 1.87L, 63% predicted FEV1/FVC ratio: 80% Interpretation: Spirometry consistent with possible restrictive disease.  Please see scanned spirometry results for details.  Medication List:  Current Outpatient Medications  Medication Sig Dispense Refill   albuterol (VENTOLIN HFA) 108 (90 Base) MCG/ACT inhaler Inhale 2 puffs into the lungs every 4 (four) hours as needed for wheezing or shortness of breath (coughing fits). 18 g 1   brimonidine (ALPHAGAN) 0.2 % ophthalmic solution Place 1 drop into both eyes 2 (two) times daily.     Cholecalciferol (VITAMIN D3) 75 MCG (3000 UT) TABS Take 3,000 Units by mouth daily.     diclofenac (VOLTAREN) 75 MG EC tablet Take 75 mg by mouth 2 (two) times daily.     dorzolamide-timolol (COSOPT) 22.3-6.8 MG/ML ophthalmic solution Place 1 drop into both eyes 2 (two) times daily.      fluticasone-salmeterol (ADVAIR) 250-50 MCG/ACT AEPB Inhale 1 puff into the lungs in the morning and at bedtime. Rinse mouth after each use. 60 each 2   levothyroxine (SYNTHROID) 25 MCG tablet Take 1 tablet (25 mcg total) by mouth daily before breakfast. 90 tablet 3   misoprostol (CYTOTEC) 200 MCG tablet INSERT ONE TABLET PER VAGINA THE NIGHT  PRIOR TO PROCEDURE     mometasone (ELOCON) 0.1 % cream APPLY TO EXTERNAL EAR ONCE DAILY AS NEEDED ITCHING     omeprazole (PRILOSEC) 20 MG capsule Take 1 capsule (20 mg total) by mouth daily. Nothing to eat or drink for 30 minutes. 30 capsule 2   Pitavastatin Calcium (LIVALO) 4 MG TABS Take 1 tablet (4 mg total) by mouth daily. 14 tablet 0   propranolol (INDERAL) 20 MG tablet Take 1 tablet (20 mg total) by mouth at bedtime. 90 tablet 3   vitamin B-12 (CYANOCOBALAMIN) 1000 MCG tablet Take 1,000 mcg by mouth daily.     No current facility-administered medications for this visit.   Allergies: Allergies  Allergen Reactions   Azithromycin Palpitations    Arrhythmia side effects   Oxycodone Shortness Of Breath   Penicillins Anaphylaxis and Itching   Percocet [Oxycodone-Acetaminophen] Shortness Of Breath   Amoxicillin Hives    Has patient had a  PCN reaction causing immediate rash, facial/tongue/throat swelling, SOB or lightheadedness with hypotension: Yes Has patient had a PCN reaction causing severe rash involving mucus membranes or skin necrosis: No Has patient had a PCN reaction that required hospitalization No Has patient had a PCN reaction occurring within the last 10 years: Yes  If all of the above answers are "NO", then may proceed with Cephalosporin use.    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    Demerol [Meperidine] Palpitations   I reviewed her past medical history, social history, family history, and environmental history and no significant changes have been reported from her previous visit.  Review of Systems  Constitutional:  Negative for chills, fever and unexpected weight change.  HENT:  Positive for congestion and voice change. Negative for rhinorrhea.   Eyes:  Negative for itching.  Respiratory:  Negative for cough, chest tightness and shortness of breath.   Cardiovascular:  Negative for chest pain.  Gastrointestinal:  Negative for abdominal  pain.  Genitourinary:  Negative for difficulty urinating.  Skin:  Negative for rash.  Allergic/Immunologic: Positive for environmental allergies.    Objective: BP 120/78   Pulse 72   Resp 18   SpO2 94%  There is no height or weight on file to calculate BMI. Physical Exam Vitals and nursing note reviewed.  Constitutional:      Appearance: Normal appearance. She is well-developed.  HENT:     Head: Normocephalic and atraumatic.     Right Ear: Tympanic membrane and external ear normal.     Left Ear: Tympanic membrane and external ear normal.     Nose: Nose normal.     Mouth/Throat:     Mouth: Mucous membranes are moist.     Pharynx: Oropharynx is clear.  Eyes:     Conjunctiva/sclera: Conjunctivae normal.  Cardiovascular:     Rate and Rhythm: Normal rate and regular rhythm.     Heart sounds: Normal heart sounds. No murmur heard.    No friction rub. No gallop.  Pulmonary:     Effort: Pulmonary effort is normal.     Breath sounds: Normal breath sounds. No wheezing, rhonchi or rales.  Musculoskeletal:     Cervical back: Neck supple.  Skin:    General: Skin is warm.     Findings: No rash.  Neurological:     Mental Status: She is alert and oriented to person, place, and time.  Psychiatric:        Behavior: Behavior normal.    Previous notes and tests were reviewed. The plan was reviewed with the patient/family, and all questions/concerned were addressed.  It was my pleasure to see Cathy Baxter today and participate in her care. Please feel free to contact me with any questions or concerns.  Sincerely,  Rexene Alberts, DO Allergy & Immunology  [facility] office: [place] office: [phone]

## 2021-11-05 NOTE — Patient Instructions (Addendum)
Asthma: Daily controller medication(s): stop Breztri. Take Advair 278mg 1 puff twice a day and rinse mouth after each use.  May use albuterol rescue inhaler 2 puffs every 4 to 6 hours as needed for shortness of breath, chest tightness, coughing, and wheezing. May use albuterol rescue inhaler 2 puffs 5 to 15 minutes prior to strenuous physical activities. Monitor frequency of use.  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  Continue environmental control measures as below. Try to take Claritin (loratadine) 1/2 tablet daily or zyrtec 1/2 tablet daily. Nasal saline spray (i.e., Simply Saline) or nasal saline lavage (i.e., NeilMed) is recommended as needed and prior to medicated nasal sprays. 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   Heartburn: See handout for lifestyle and dietary modifications. Start omeprazole '20mg'$  daily in the mornings - nothing to eat or drink for 30 minutes afterwards.  Voice hoarseness If no improvement with above regimen will refer to Dr. MRowe Clack(ENT who specialized in vocal cords).   Follow up in 2 months or sooner if needed.   Pet Allergen Avoidance: Contrary to popular opinion, there are no "hypoallergenic" breeds of dogs or cats. That is because people are not allergic to an animal's hair, but to an allergen found in the animal's saliva, dander (dead skin flakes) or urine. Pet allergy symptoms typically occur within minutes. For some people, symptoms can build up and become most severe 8 to 12 hours after contact with the animal. People with severe allergies can experience reactions in public places if dander has been  transported on the pet owners' clothing. Keeping an animal outdoors is only a partial solution, since homes with pets in the yard still have higher concentrations of animal allergens. Before getting a pet, ask your allergist to determine if you are allergic to animals. If your pet is already considered part of your family, try to minimize contact and keep the pet out of the bedroom and other rooms where you spend a great deal of time. As with dust mites, vacuum carpets often or replace carpet with a hardwood floor, tile or linoleum. High-efficiency particulate air (HEPA) cleaners can reduce allergen levels over time. While dander and saliva are the source of cat and dog allergens, urine is the source of allergens from rabbits, hamsters, mice and g[place]pigs; so ask a non-allergic family member to clean the animal's cage. If you have a pet allergy, talk to your allergist about the potential for allergy immunotherapy (allergy shots). This strategy can often provide long-term relief.

## 2021-12-10 ENCOUNTER — Ambulatory Visit: Payer: No Typology Code available for payment source | Admitting: Cardiology

## 2021-12-14 ENCOUNTER — Encounter: Payer: Self-pay | Admitting: Cardiology

## 2021-12-14 ENCOUNTER — Ambulatory Visit: Payer: Commercial Managed Care - HMO | Attending: Cardiology | Admitting: Cardiology

## 2021-12-14 VITALS — BP 136/90 | HR 77 | Ht 62.5 in | Wt 253.2 lb

## 2021-12-14 DIAGNOSIS — E7849 Other hyperlipidemia: Secondary | ICD-10-CM | POA: Diagnosis not present

## 2021-12-14 DIAGNOSIS — R400 Somnolence: Secondary | ICD-10-CM | POA: Diagnosis not present

## 2021-12-14 DIAGNOSIS — I10 Essential (primary) hypertension: Secondary | ICD-10-CM

## 2021-12-14 DIAGNOSIS — E8881 Metabolic syndrome: Secondary | ICD-10-CM | POA: Diagnosis not present

## 2021-12-14 MED ORDER — FUROSEMIDE 40 MG PO TABS: 40.0000 mg | ORAL_TABLET | Freq: Every day | ORAL | 0 refills | Status: DC

## 2021-12-14 MED ORDER — POTASSIUM CHLORIDE CRYS ER 20 MEQ PO TBCR: 20.0000 meq | EXTENDED_RELEASE_TABLET | Freq: Every day | ORAL | 0 refills | Status: DC

## 2021-12-14 NOTE — Patient Instructions (Addendum)
Medication Instructions:  Your physician has recommended you make the following change in your medication:  START Lasix 40 mg by mouth ONCE daily for 5 days START Potassium 20 mEq by mouth ONCE daily, with Lasix, for 5 days  *If you need a refill on your cardiac medications before your next appointment, please call your pharmacy*   Lab Work: Your physician recommends that you return for lab work in: [place] (A1C, BMET, LIPIDS, MG)  If you have labs (blood work) drawn today and your tests are completely normal, you will receive your results only by: [place] (if you have [place]) OR A paper copy in the mail If you have any lab test that is abnormal or we need to change your treatment, we will call you to review the results.   Testing/Procedures: Your physician has recommended that you have a sleep study. This test records several body functions during sleep, including: brain activity, eye movement, oxygen and carbon dioxide blood levels, heart rate and rhythm, breathing rate and rhythm, the flow of air through your mouth and nose, snoring, body muscle movements, and chest and belly movement.  We will call to schedule once we have completed the PreCert with your insurance.   Follow-Up: At [facility], you and your health needs are our priority.  As part of our continuing mission to provide you with exceptional heart care, we have created designated Provider Care Teams.  These Care Teams include your primary Cardiologist (physician) and Advanced Practice Providers (APPs -  Physician Assistants and Nurse Practitioners) who all work together to provide you with the care you need, when you need it.  We recommend signing up for the patient portal called "MyChart".  Sign up information is provided on this After Visit Summary.  MyChart is used to connect with patients for Virtual Visits (Telemedicine).  Patients are able to view lab/test results, encounter notes, upcoming  appointments, etc.  Non-urgent messages can be sent to your provider as well.   To learn more about what you can do with MyChart, go to [email or web address].    Your next appointment:   6 month(s)  The format for your next appointment:   In Person  Provider:   Berniece Salines, DO     Other Instructions Dr. Harriet Masson has ordered a referral for you to our [facility] to discuss Weight Management  Important Information About Sugar

## 2021-12-14 NOTE — Progress Notes (Addendum)
Cardiology Office Note:    Date:  12/15/2021   ID:  Cathy Baxter, DOB 26-Nov-1960, MRN [id number]  PCP:  Ma Hillock, DO  Cardiologist:  Berniece Salines, DO  Electrophysiologist:  None   Referring MD: Ma Hillock, DO   " I am doing well"  History of Present Illness:    Cathy Baxter is a 61 y.o. female with a hx of hypothyroidism, hiatal hernia, obesity is here today for follow-up visit.    I saw the patient on February 2022 at that time she was experiencing palpitations and shortness of breath.  She also complained of daytime somnolence.  At the time I placed a monitor on the patient to make sure he had she was not experiencing any significant arrhythmia.  An echocardiogram was also ordered. Based on her Zio showing paroxysmal SVT I recommended the patient take propanolol but the patient preferred to do this on a as needed basis.  I do not think this is unreasonable because she is now only experiencing occasional symptoms.  I saw the patient on November 26, 2020 at that time she was doing well from a cardiovascular standpoint.  Continue her propanolol as needed.  During her visit she was on Lipitor 20 mg a day but since I saw the patient we had to stop the Lipitor.Since her last visit she was seen by the Pharm.D. team during which time they talked about her lipids and was started on Pitavastatin 4 mg daily.  Today her concern is bilateral leg swelling as well as the fact that she feels that her daytime somnolence is getting worse.  She is concerned that she is having elevated blood sugar because she gets her blood sugar taking every morning after her boyfriend brought her glucometer.  She notes her sugars are usually in the 160-170 since she is concerned.  She also is interested in weight loss due to being placed on hold for her surgery until she lose some weight.   Past Medical History:  Diagnosis Date   Allergy    Anemia    Arthritis    Asthma    Chicken pox    Depression     Fatty liver    GERD (gastroesophageal reflux disease)    Hiatal hernia    Hyperlipidemia    Pigmentary glaucoma of both eyes    Polyp of stomach    Shingles    Thyroid disease    Urinary incontinence    Uterine fibroid    Uterine fibroids in pregnancy, postpartum condition 11/06/2015    Past Surgical History:  Procedure Laterality Date   CHOLECYSTECTOMY  2002   EYE SURGERY     HYSTEROSCOPY WITH D & C N/A 11/13/2020   Procedure: DILATATION AND CURETTAGE /HYSTEROSCOPY;  Surgeon: Linda Hedges, DO;  Location: [facility];  Service: Gynecology;  Laterality: N/A;  rep Vicente Males to cover case confirmed on 11/10/20 CS   KNEE ARTHROSCOPY Left 08/2015   NASAL SINUS SURGERY  2000    Current Medications: Current Meds  Medication Sig   albuterol (VENTOLIN HFA) 108 (90 Base) MCG/ACT inhaler Inhale 2 puffs into the lungs every 4 (four) hours as needed for wheezing or shortness of breath (coughing fits).   brimonidine (ALPHAGAN) 0.2 % ophthalmic solution Place 1 drop into both eyes 2 (two) times daily.   Cholecalciferol (VITAMIN D3) 75 MCG (3000 UT) TABS Take 3,000 Units by mouth daily.   dorzolamide-timolol (COSOPT) 22.3-6.8 MG/ML ophthalmic solution Place 1 drop into both eyes 2 (  two) times daily.    fluticasone-salmeterol (ADVAIR) 250-50 MCG/ACT AEPB Inhale 1 puff into the lungs in the morning and at bedtime. Rinse mouth after each use.   furosemide (LASIX) 40 MG tablet Take 1 tablet (40 mg total) by mouth daily for 5 days.   levothyroxine (SYNTHROID) 25 MCG tablet Take 1 tablet (25 mcg total) by mouth daily before breakfast.   mometasone (ELOCON) 0.1 % cream APPLY TO EXTERNAL EAR ONCE DAILY AS NEEDED ITCHING   omeprazole (PRILOSEC) 20 MG capsule Take 1 capsule (20 mg total) by mouth daily. Nothing to eat or drink for 30 minutes.   potassium chloride SA (KLOR-CON M20) 20 MEQ tablet Take 1 tablet (20 mEq total) by mouth daily for 5 days.   vitamin B-12 (CYANOCOBALAMIN) 1000 MCG tablet Take 1,000 mcg by  mouth daily.     Allergies:   Azithromycin, Oxycodone, Penicillins, Percocet [oxycodone-acetaminophen], Amoxicillin, Doxycycline, Prednisone, and Demerol [meperidine]   Social History   Socioeconomic History   Marital status: Divorced    Spouse name: Not on file   Number of children: Not on file   Years of education: 11   Highest education level: Not on file  Occupational History   Occupation: stock  Tobacco Use   Smoking status: Former    Packs/day: 3.00    Years: 12.00    Total pack years: 36.00    Types: Cigarettes    Quit date: 07/12/1983    Years since quitting: 38.4   Smokeless tobacco: Never  Vaping Use   Vaping Use: Never used  Substance and Sexual Activity   Alcohol use: Not Currently    Comment: once every 6 months    Drug use: No   Sexual activity: Not on file  Other Topics Concern   Not on file  Social History Narrative   Divorced. She has had 3 children, one passed away young from cancer.    11th grade education. Works at [employer].    Former smoker.   Drinks caffeine.    Wears seatbelt, smoke detector in the home.    Firearms in the home.    Feels safe in her relationships.    Social Determinants of Health   Financial Resource Strain: Not on file  Food Insecurity: Not on file  Transportation Needs: Not on file  Physical Activity: Not on file  Stress: Not on file  Social Connections: Not on file     Family History: The patient's family history includes Asthma in her mother; Breast cancer (age of onset: 85) in her sister; COPD in her mother; Cancer (age of onset: 37) in her son; Celiac disease in her daughter; Early death in her father; Heart disease in her mother. There is no history of Allergic rhinitis, Eczema, Urticaria, Angioedema, Immunodeficiency, or Atopy.  ROS:   Review of Systems  Constitution: Negative for decreased appetite, fever and weight gain.  HENT: Negative for congestion, ear discharge, hoarse voice and sore throat.   Eyes:  Negative for discharge, redness, vision loss in right eye and visual halos.  Cardiovascular: Reports palpitations. Negative for chest pain, dyspnea on exertion, leg swelling, orthopnea. Respiratory: Negative for cough, hemoptysis, shortness of breath and snoring.   Endocrine: Negative for heat intolerance and polyphagia.  Hematologic/Lymphatic: Negative for bleeding problem. Does not bruise/bleed easily.  Skin: Negative for flushing, nail changes, rash and suspicious lesions.  Musculoskeletal: Negative for arthritis, joint pain, muscle cramps, myalgias, neck pain and stiffness.  Gastrointestinal: Negative for abdominal pain, bowel incontinence, diarrhea and  excessive appetite.  Genitourinary: Negative for decreased libido, genital sores and incomplete emptying.  Neurological: Negative for brief paralysis, focal weakness, headaches and loss of balance.  Psychiatric/Behavioral: Negative for altered mental status, depression and suicidal ideas.  Allergic/Immunologic: Negative for HIV exposure and persistent infections.    EKGs/Labs/Other Studies Reviewed:    The following studies were reviewed today:   EKG:  Sinus Rhythm heart rate 77 bpm  Echo 03/26/2020 IMPRESSIONS   1. GLS: -21.9. Left ventricular ejection fraction, by estimation, is 60  to 65%. The left ventricle has normal function. The left ventricle has no  regional wall motion abnormalities. Left ventricular diastolic parameters  were normal.   2. Right ventricular systolic function is normal. The right ventricular  size is normal. There is normal pulmonary artery systolic pressure.   3. The mitral valve is normal in structure. No evidence of mitral valve  regurgitation. No evidence of mitral stenosis.   4. The aortic valve is normal in structure. Aortic valve regurgitation is  not visualized. No aortic stenosis is present.   5. The inferior vena cava is normal in size with greater than 50%  respiratory variability, suggesting  right atrial pressure of 3 mmHg.   FINDINGS   Left Ventricle: GLS: -21.9. Left ventricular ejection fraction, by  estimation, is 60 to 65%. The left ventricle has normal function. The left  ventricle has no regional wall motion abnormalities. The left ventricular  internal cavity size was normal in  size. There is no left ventricular hypertrophy. Left ventricular diastolic  parameters were normal.   Right Ventricle: The right ventricular size is normal. No increase in  right ventricular wall thickness. Right ventricular systolic function is  normal. There is normal pulmonary artery systolic pressure. The tricuspid  regurgitant velocity is 2.71 m/s, and   with an assumed right atrial pressure of 3 mmHg, the estimated right  ventricular systolic pressure is 98.1 mmHg.   Left Atrium: Left atrial size was normal in size.   Right Atrium: Right atrial size was normal in size.   Pericardium: There is no evidence of pericardial effusion.   Mitral Valve: The mitral valve is normal in structure. No evidence of  mitral valve regurgitation. No evidence of mitral valve stenosis.   Tricuspid Valve: The tricuspid valve is normal in structure. Tricuspid  valve regurgitation is mild . No evidence of tricuspid stenosis.   Aortic Valve: The aortic valve is normal in structure. Aortic valve  regurgitation is not visualized. No aortic stenosis is present.   Pulmonic Valve: The pulmonic valve was normal in structure. Pulmonic valve  regurgitation is not visualized. No evidence of pulmonic stenosis.   Aorta: The aortic root is normal in size and structure.   Venous: The inferior vena cava is normal in size with greater than 50%  respiratory variability, suggesting right atrial pressure of 3 mmHg.   IAS/Shunts: No atrial level shunt detected by    Zio The patient wore the monitor for 7 days starting February 29, 2020. Indication: Palpitations   The minimum heart rate was 53 bpm, maximum heart  rate was 200 bpm, and average heart rate was 77 bpm. Predominant underlying rhythm was Sinus Rhythm.   4 Supraventricular Tachycardia runs occurred, the run with the fastest interval lasting 9 beats with a max rate of 200 bpm (average 176 bpm); the run with the fastest interval was also the longest.     Premature atrial complexes were rare less than 1%. Premature Ventricular complexes were  rare less than 1%.   No ventricular tachycardia, no pauses, No AV block and no atrial fibrillation present. 10 patient triggered events 1 associated with supraventricular tachycardia and the remaining associated with premature atrial complex in sinus rhythm.  6 diary events are associated with premature atrial complexes sinus rhythm   Conclusion: This study is remarkable for the following:                                     1.  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                                         2.  Rare symptomatic premature atrial complexes  Recent Labs: 04/15/2021: TSH 0.96 12/14/2021: BUN 11; Creatinine, Ser 0.79; Magnesium 2.1; Potassium 4.6; Sodium 142  Recent Lipid Panel    Component Value Date/Time   CHOL 256 (H) 12/14/2021 1508   TRIG 116 12/14/2021 1508   HDL 68 12/14/2021 1508   CHOLHDL 3.8 12/14/2021 1508   LDLCALC 168 (H) 12/14/2021 1508    Physical Exam:    VS:  BP (!) 136/90   Pulse 77   Ht 5' 2.5" (1.588 m)   Wt 253 lb 3.2 oz (114.9 kg)   SpO2 97%   BMI 45.57 kg/m     Wt Readings from Last 3 Encounters:  12/14/21 253 lb 3.2 oz (114.9 kg)  04/15/21 224 lb (101.6 kg)  12/01/20 225 lb (102.1 kg)     GEN: Well nourished, well developed in no acute distress HEENT: Normal NECK: No JVD; No carotid bruits LYMPHATICS: No lymphadenopathy CARDIAC: S1S2 noted,RRR, no murmurs, rubs, gallops RESPIRATORY:  Clear to auscultation without rales, wheezing or rhonchi  ABDOMEN: Soft, non-tender, non-distended, +bowel sounds, no  guarding. EXTREMITIES: No edema, No cyanosis, no clubbing MUSCULOSKELETAL:  No deformity  SKIN: Warm and dry NEUROLOGIC:  Alert and oriented x 3, non-focal PSYCHIATRIC:  Normal affect, good insight  ASSESSMENT:    1. Other hyperlipidemia   2. Primary hypertension   3. Metabolic syndrome   4. Daytime somnolence     PLAN:    She does have some bilateral leg swelling I am going to give the patient a short course of Lasix 40 mg and 5 days.  With potassium supplement.  With the concern for high sugars we will just go ahead and do a hemoglobin A1c to screen the patient for diabetes.  She is still taking her propanolol as needed no changes will be made. Refer the patient to our Pharm.D. weight loss program as well.  For daytime somnolence as well as fatigue I am going to order a sleep study for this patient.  The patient understands the need to lose weight with diet and exercise. We have discussed specific strategies for this.  The patient is in agreement with the above plan. The patient left the office in stable condition.  The patient will follow up in 1 year or sooner if needed.   Medication Adjustments/Labs and Tests Ordered: Current medicines are reviewed at length with the patient today.  Concerns regarding medicines are outlined above.  Orders Placed This Encounter  Procedures   HgB A1c   Magnesium   Basic metabolic panel   Lipid panel   AMB Referral to [facility] Pharm-D  EKG 12-Lead   Split night study    Meds ordered this encounter  Medications   furosemide (LASIX) 40 MG tablet    Sig: Take 1 tablet (40 mg total) by mouth daily for 5 days.    Dispense:  5 tablet    Refill:  0   potassium chloride SA (KLOR-CON M20) 20 MEQ tablet    Sig: Take 1 tablet (20 mEq total) by mouth daily for 5 days.    Dispense:  5 tablet    Refill:  0     Patient Instructions  Medication Instructions:  Your physician has recommended you make the following change in your  medication:  START Lasix 40 mg by mouth ONCE daily for 5 days START Potassium 20 mEq by mouth ONCE daily, with Lasix, for 5 days  *If you need a refill on your cardiac medications before your next appointment, please call your pharmacy*   Lab Work: Your physician recommends that you return for lab work in: [place] (A1C, BMET, LIPIDS, MG)  If you have labs (blood work) drawn today and your tests are completely normal, you will receive your results only by: [place] (if you have [place]) OR A paper copy in the mail If you have any lab test that is abnormal or we need to change your treatment, we will call you to review the results.   Testing/Procedures: Your physician has recommended that you have a sleep study. This test records several body functions during sleep, including: brain activity, eye movement, oxygen and carbon dioxide blood levels, heart rate and rhythm, breathing rate and rhythm, the flow of air through your mouth and nose, snoring, body muscle movements, and chest and belly movement.  We will call to schedule once we have completed the PreCert with your insurance.   Follow-Up: At [facility], you and your health needs are our priority.  As part of our continuing mission to provide you with exceptional heart care, we have created designated Provider Care Teams.  These Care Teams include your primary Cardiologist (physician) and Advanced Practice Providers (APPs -  Physician Assistants and Nurse Practitioners) who all work together to provide you with the care you need, when you need it.  We recommend signing up for the patient portal called "MyChart".  Sign up information is provided on this After Visit Summary.  MyChart is used to connect with patients for Virtual Visits (Telemedicine).  Patients are able to view lab/test results, encounter notes, upcoming appointments, etc.  Non-urgent messages can be sent to your provider as well.   To learn more about what  you can do with MyChart, go to [email or web address].    Your next appointment:   6 month(s)  The format for your next appointment:   In Person  Provider:   Berniece Salines, DO     Other Instructions Dr. Harriet Masson has ordered a referral for you to our [facility] Pharm[facility] to discuss Weight Management  Important Information About Sugar         Adopting a Healthy Lifestyle.  Know what a healthy weight is for you (roughly BMI <25) and aim to maintain this   Aim for 7+ servings of fruits and vegetables daily   65-80+ fluid ounces of water or unsweet tea for healthy kidneys   Limit to max 1 drink of alcohol per day; avoid smoking/tobacco   Limit animal fats in diet for cholesterol and heart health - choose grass fed whenever available   Avoid highly processed foods,  and foods high in saturated/trans fats   Aim for low stress - take time to unwind and care for your mental health   Aim for 150 min of moderate intensity exercise weekly for heart health, and weights twice weekly for bone health   Aim for 7-9 hours of sleep daily   When it comes to diets, agreement about the perfect plan isnt easy to find, even among the experts. Experts at the [place] developed an idea known as the Healthy Eating Plate. Just imagine a plate divided into logical, healthy portions.   The emphasis is on diet quality:   Load up on vegetables and fruits - one-half of your plate: Aim for color and variety, and remember that potatoes dont count.   Go for whole grains - one-quarter of your plate: Whole wheat, barley, wheat berries, quinoa, oats, brown rice, and foods made with them. If you want pasta, go with whole wheat pasta.   Protein power - one-quarter of your plate: Fish, chicken, beans, and nuts are all healthy, versatile protein sources. Limit red meat.   The diet, however, does go beyond the plate, offering a few other suggestions.   Use healthy plant oils, such as olive,  canola, soy, corn, sunflower and peanut. Check the labels, and avoid partially hydrogenated oil, which have unhealthy trans fats.   If youre thirsty, drink water. Coffee and tea are good in moderation, but skip sugary drinks and limit milk and dairy products to one or two daily servings.   The type of carbohydrate in the diet is more important than the amount. Some sources of carbohydrates, such as vegetables, fruits, whole grains, and beans-are healthier than others.   Finally, stay active  Signed, Berniece Salines, DO  12/15/2021 4:38 PM    [facility]

## 2021-12-15 LAB — BASIC METABOLIC PANEL
BUN/Creatinine Ratio: 14 (ref 12–28)
BUN: 11 mg/dL (ref 8–27)
CO2: 21 mmol/L (ref 20–29)
Calcium: 9.4 mg/dL (ref 8.7–10.3)
Chloride: 104 mmol/L (ref 96–106)
Creatinine, Ser: 0.79 mg/dL (ref 0.57–1.00)
Glucose: 87 mg/dL (ref 70–99)
Potassium: 4.6 mmol/L (ref 3.5–5.2)
Sodium: 142 mmol/L (ref 134–144)
eGFR: 85 mL/min/{1.73_m2} (ref >59)

## 2021-12-15 LAB — HEMOGLOBIN A1C
Est. average glucose Bld gHb Est-mCnc: 108 mg/dL
Hgb A1c MFr Bld: 5.4 % (ref 4.8–5.6)

## 2021-12-15 LAB — LIPID PANEL
Chol/HDL Ratio: 3.8 ratio (ref 0.0–4.4)
Cholesterol, Total: 256 mg/dL — ABNORMAL HIGH (ref 100–199)
HDL: 68 mg/dL (ref >39)
LDL Chol Calc (NIH): 168 mg/dL — ABNORMAL HIGH (ref 0–99)
Triglycerides: 116 mg/dL (ref 0–149)
VLDL Cholesterol Cal: 20 mg/dL (ref 5–40)

## 2021-12-15 LAB — MAGNESIUM: Magnesium: 2.1 mg/dL (ref 1.6–2.3)

## 2021-12-16 ENCOUNTER — Telehealth: Payer: Self-pay | Admitting: Cardiology

## 2021-12-16 MED ORDER — PITAVASTATIN CALCIUM 4 MG PO TABS: 4.0000 mg | ORAL_TABLET | Freq: Every day | ORAL | 11 refills | Status: DC

## 2021-12-16 NOTE — Telephone Encounter (Signed)
Patient called stating she received a call to start back her statin medication.  She states she can't take the last medication she was on because it made her sleepy all the time.  She wants to know what medication she can take and if can be called into the pharmacy for her.

## 2021-12-16 NOTE — Telephone Encounter (Signed)
Spoke with pt, aware of dr tobb's recommendation.  New script sent to the pharmacy

## 2021-12-16 NOTE — Telephone Encounter (Signed)
Spoke with pt, she reports the atorvastatin before and does not want to take that medication anymore. She has sample packs for livilo that was given to her by the pharmacist. She wants to make sure okay with dr tobb. Atorvastatin is the only statin she has tried. Aware will forward to dr tobb.

## 2022-01-11 NOTE — Progress Notes (Unsigned)
Follow Up Note  RE: Cathy Baxter MRN: [id number] DOB: August 16, 1960 Date of Office Visit: 01/12/2022  Referring provider: Ma Hillock, DO Primary care provider: Ma Hillock, DO  Chief Complaint: No chief complaint on file.  History of Present Illness: I had the pleasure of seeing Cathy Baxter for a follow up visit at the [facility] on 01/11/2022. She is a 62 y.o. female, who is being followed for asthma, voice hoarseness and allergic rhinitis. Her previous allergy office visit was on 11/05/2021 with Dr. Maudie Mercury. Today is a regular follow up visit.  Asthma Past history - Issues with her breathing for 20+ years.  Currently on Advair 250 mcg 1 puff twice a day x1 year and albuterol as needed with good benefit.  Cathy Baxter in the past.  Using albuterol less than once per week.  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  Takes Nexium as needed for reflux.  Seen cardiology as well. Interim history - Cathy Baxter is not more effective than Advair and since started 6 months ago noted some pain with inhalation at times. Denies albuterol use.  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  Spirometry today showed some restriction. Daily controller medication(s): stop Breztri. Take Advair 232mg 1 puff twice a day and rinse mouth after each use.  May use albuterol rescue inhaler 2 puffs every 4 to 6 hours as needed for shortness of breath, chest tightness, coughing, and wheezing. May use albuterol rescue inhaler 2 puffs 5 to 15 minutes prior to strenuous physical activities. Monitor frequency of use.  Get spirometry at next visit. Consider biologics next.    Voice hoarseness Having issues with voice hoarseness and not sure when this started. She does mention having reflux symptoms at times and not taking anything daily for this. See handout for lifestyle and dietary modifications. Start omeprazole '20mg'$  daily in the mornings -  nothing to eat or drink for 30 minutes afterwards. If no improvement with above regimen will refer to Dr. MRowe Clack(ENT who specializes in vocal cords).    Other allergic rhinitis Past history - Perennial rhinitis symptoms with no specific triggers. 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  Skin testing in 2014 was positive to dust mites, cat, dog, horse, feathers, grass, trees, pigweed, mold.  She was on allergy immunotherapy for less than 1 year.  She also had sinus surgery in 2000. 2022 skin testing showed: Positive to cats, dogs.  Negative to common foods. No pets at home.  ENT evaluation (no polyps, empty nose syndrome). Interim history -  Still has nasal congestion and xyzal causes drowsiness.  Continue environmental control measures as below. Try to take Claritin (loratadine) 1/2 tablet daily or zyrtec 1/2 tablet daily. Nasal saline spray (i.e., Simply Saline) or nasal saline lavage (i.e., NeilMed) is recommended as needed and prior to medicated nasal sprays. 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    Return in about 2 months (around 01/05/2022).  Assessment and Plan: DKaniyahis a 62y.o. female with: No problem-specific Assessment & Plan notes found for this encounter.  No follow-ups on file.  No orders of the defined types were placed in this encounter.  Lab Orders  No laboratory test(s) ordered today    Diagnostics: Spirometry:  Tracings reviewed. Her effort: {Blank single:19197::"Good reproducible efforts.","It was hard to get consistent efforts and there is a question as to whether this reflects a maximal maneuver.","Poor effort, data can not  be interpreted."} FVC: ***L FEV1: ***L, ***% predicted FEV1/FVC ratio: ***% Interpretation: {Blank single:19197::"Spirometry consistent with mild obstructive disease","Spirometry consistent with moderate obstructive disease","Spirometry consistent with severe obstructive disease","Spirometry consistent with possible restrictive  disease","Spirometry consistent with mixed obstructive and restrictive disease","Spirometry uninterpretable due to technique","Spirometry consistent with normal pattern","No overt abnormalities noted given today's efforts"}.  Please see scanned spirometry results for details.  Skin Testing: {Blank single:19197::"Select foods","Environmental allergy panel","Environmental allergy panel and select foods","Food allergy panel","None","Deferred due to recent antihistamines use"}. *** Results discussed with patient/family.   Medication List:  Current Outpatient Medications  Medication Sig Dispense Refill   albuterol (VENTOLIN HFA) 108 (90 Base) MCG/ACT inhaler Inhale 2 puffs into the lungs every 4 (four) hours as needed for wheezing or shortness of breath (coughing fits). 18 g 1   brimonidine (ALPHAGAN) 0.2 % ophthalmic solution Place 1 drop into both eyes 2 (two) times daily.     Cholecalciferol (VITAMIN D3) 75 MCG (3000 UT) TABS Take 3,000 Units by mouth daily.     diclofenac (VOLTAREN) 75 MG EC tablet Take 75 mg by mouth 2 (two) times daily. (Patient not taking: Reported on 12/14/2021)     dorzolamide-timolol (COSOPT) 22.3-6.8 MG/ML ophthalmic solution Place 1 drop into both eyes 2 (two) times daily.      fluticasone-salmeterol (ADVAIR) 250-50 MCG/ACT AEPB Inhale 1 puff into the lungs in the morning and at bedtime. Rinse mouth after each use. 60 each 2   furosemide (LASIX) 40 MG tablet Take 1 tablet (40 mg total) by mouth daily for 5 days. 5 tablet 0   levothyroxine (SYNTHROID) 25 MCG tablet Take 1 tablet (25 mcg total) by mouth daily before breakfast. 90 tablet 3   mometasone (ELOCON) 0.1 % cream APPLY TO EXTERNAL EAR ONCE DAILY AS NEEDED ITCHING     omeprazole (PRILOSEC) 20 MG capsule Take 1 capsule (20 mg total) by mouth daily. Nothing to eat or drink for 30 minutes. 30 capsule 2   Pitavastatin Calcium 4 MG TABS Take 1 tablet (4 mg total) by mouth daily. 30 tablet 11   potassium chloride SA  (KLOR-CON M20) 20 MEQ tablet Take 1 tablet (20 mEq total) by mouth daily for 5 days. 5 tablet 0   vitamin B-12 (CYANOCOBALAMIN) 1000 MCG tablet Take 1,000 mcg by mouth daily.     No current facility-administered medications for this visit.   Allergies: Allergies  Allergen Reactions   Azithromycin Palpitations    Arrhythmia side effects   Oxycodone Shortness Of Breath   Penicillins Anaphylaxis and Itching   Percocet [Oxycodone-Acetaminophen] Shortness Of Breath   Amoxicillin Hives    Has patient had a PCN reaction causing immediate rash, facial/tongue/throat swelling, SOB or lightheadedness with hypotension: Yes Has patient had a PCN reaction causing severe rash involving mucus membranes or skin necrosis: No Has patient had a PCN reaction that required hospitalization No Has patient had a PCN reaction occurring within the last 10 years: Yes  If all of the above answers are "NO", then may proceed with Cephalosporin use.    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    Demerol [Meperidine] Palpitations   I reviewed her past medical history, social history, family history, and environmental history and no significant changes have been reported from her previous visit.  Review of Systems  Constitutional:  Negative for chills, fever and unexpected weight change.  HENT:  Positive for congestion and voice change. Negative for rhinorrhea.   Eyes:  Negative for itching.  Respiratory:  Negative for cough, chest tightness and shortness of breath.   Cardiovascular:  Negative for chest pain.  Gastrointestinal:  Negative for abdominal pain.  Genitourinary:  Negative for difficulty urinating.  Skin:  Negative for rash.  Allergic/Immunologic: Positive for environmental allergies.    Objective: There were no vitals taken for this visit. There is no height or weight on file to calculate BMI. Physical Exam Vitals and nursing note reviewed.  Constitutional:       Appearance: Normal appearance. She is well-developed.  HENT:     Head: Normocephalic and atraumatic.     Right Ear: Tympanic membrane and external ear normal.     Left Ear: Tympanic membrane and external ear normal.     Nose: Nose normal.     Mouth/Throat:     Mouth: Mucous membranes are moist.     Pharynx: Oropharynx is clear.  Eyes:     Conjunctiva/sclera: Conjunctivae normal.  Cardiovascular:     Rate and Rhythm: Normal rate and regular rhythm.     Heart sounds: Normal heart sounds. No murmur heard.    No friction rub. No gallop.  Pulmonary:     Effort: Pulmonary effort is normal.     Breath sounds: Normal breath sounds. No wheezing, rhonchi or rales.  Musculoskeletal:     Cervical back: Neck supple.  Skin:    General: Skin is warm.     Findings: No rash.  Neurological:     Mental Status: She is alert and oriented to person, place, and time.  Psychiatric:        Behavior: Behavior normal.    Previous notes and tests were reviewed. The plan was reviewed with the patient/family, and all questions/concerned were addressed.  It was my pleasure to see Kenslie today and participate in her care. Please feel free to contact me with any questions or concerns.  Sincerely,  Rexene Alberts, DO Allergy & Immunology  [facility]: [address] office: [place] office: [phone]

## 2022-01-12 ENCOUNTER — Telehealth: Payer: Self-pay

## 2022-01-12 ENCOUNTER — Other Ambulatory Visit: Payer: Self-pay

## 2022-01-12 ENCOUNTER — Encounter: Payer: Self-pay | Admitting: Allergy

## 2022-01-12 ENCOUNTER — Ambulatory Visit (INDEPENDENT_AMBULATORY_CARE_PROVIDER_SITE_OTHER): Payer: Commercial Managed Care - HMO | Admitting: Allergy

## 2022-01-12 VITALS — BP 122/78 | HR 66 | Temp 97.9°F | Resp 20 | Ht 62.0 in | Wt 238.5 lb

## 2022-01-12 DIAGNOSIS — R490 Dysphonia: Secondary | ICD-10-CM | POA: Diagnosis not present

## 2022-01-12 DIAGNOSIS — J4540 Moderate persistent asthma, uncomplicated: Secondary | ICD-10-CM | POA: Diagnosis not present

## 2022-01-12 DIAGNOSIS — J3089 Other allergic rhinitis: Secondary | ICD-10-CM | POA: Diagnosis not present

## 2022-01-12 DIAGNOSIS — J019 Acute sinusitis, unspecified: Secondary | ICD-10-CM | POA: Diagnosis not present

## 2022-01-12 DIAGNOSIS — H6691 Otitis media, unspecified, right ear: Secondary | ICD-10-CM | POA: Insufficient documentation

## 2022-01-12 MED ORDER — CEFDINIR 300 MG PO CAPS: 300.0000 mg | ORAL_CAPSULE | Freq: Two times a day (BID) | ORAL | 0 refills | Status: DC

## 2022-01-12 NOTE — Assessment & Plan Note (Signed)
Past history - Perennial rhinitis symptoms with no specific triggers. 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  Skin testing in 2014 was positive to dust mites, cat, dog, horse, feathers, grass, trees, pigweed, mold.  She was on allergy immunotherapy for less than 1 year.  She also had sinus surgery in 2000. 2022 skin testing showed: Positive to cats, dogs.  Negative to common foods. No pets at home.  ENT evaluation (no polyps, empty nose syndrome). Continue environmental control measures as below. Try to take Claritin (loratadine) 1/2 tablet daily or zyrtec 1/2 tablet daily. Nasal saline spray (i.e., Simply Saline) or nasal saline lavage (i.e., NeilMed) is recommended as needed and prior to medicated nasal sprays. 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

## 2022-01-12 NOTE — Assessment & Plan Note (Signed)
Take cefinidir '300mg'$  twice a day for 10 days. Patient does not recall having a reaction to this antibiotics in the past.  Do sinus rinses twice a day.  Try to take Claritin (loratadine) 1/2 tablet daily or zyrtec 1/2 tablet daily.

## 2022-01-12 NOTE — Telephone Encounter (Signed)
Per Dr. Maudie Mercury, please refer Dr. Rowe Clack (ENT who specialized in vocal cords for voice hoariness.

## 2022-01-12 NOTE — Assessment & Plan Note (Signed)
.   See assessment and plan as above. 

## 2022-01-12 NOTE — Assessment & Plan Note (Signed)
Past history - Issues with her breathing for 20+ years.  Currently on Advair 250 mcg 1 puff twice a day x1 year and albuterol as needed with good benefit.  [name] in the past.  Using albuterol less than once per week.  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  Takes Nexium as needed for reflux.  Seen cardiology as well. Interim history - no worsening symptoms with stepping down from [place] to Advair.  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  Daily controller medication(s):  Take Advair 2110mg 1 puff twice a day and rinse mouth after each use.  May use albuterol rescue inhaler 2 puffs every 4 to 6 hours as needed for shortness of breath, chest tightness, coughing, and wheezing. May use albuterol rescue inhaler 2 puffs 5 to 15 minutes prior to strenuous physical activities. Monitor frequency of use.  Get spirometry at next visit. Consider biologics next.

## 2022-01-12 NOTE — Patient Instructions (Addendum)
Sinus infection/ear infection Take cefinidir '300mg'$  twice a day for 10 days. Do sinus rinses twice a day.  Try to take Claritin (loratadine) 1/2 tablet daily or zyrtec 1/2 tablet daily.  Asthma: Daily controller medication(s):  Take Advair 245mg 1 puff twice a day and rinse mouth after each use.  May use albuterol rescue inhaler 2 puffs every 4 to 6 hours as needed for shortness of breath, chest tightness, coughing, and wheezing. May use albuterol rescue inhaler 2 puffs 5 to 15 minutes prior to strenuous physical activities. Monitor frequency of use.  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  Continue environmental control measures as below. Try to take Claritin (loratadine) 1/2 tablet daily or zyrtec 1/2 tablet daily. Nasal saline spray (i.e., Simply Saline) or nasal saline lavage (i.e., NeilMed) is recommended as needed and prior to medicated nasal sprays. 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   Heartburn: Continue lifestyle and dietary modifications. Continue omeprazole '20mg'$  daily in the mornings - nothing to eat or drink for 30 minutes afterwards.  Voice hoarseness Refer Dr. MRowe Clack(ENT who specialized in vocal cords).   Follow up in 2 months or sooner if needed.  Follow up with your ophthalmologist.   Pet Allergen Avoidance: Contrary to popular opinion, there are no "hypoallergenic" breeds of dogs or cats. That is because people are not allergic to an animal's hair, but to an allergen found in the animal's saliva, dander (dead skin flakes) or urine. Pet allergy symptoms typically occur within minutes. For some people, symptoms can  build up and become most severe 8 to 12 hours after contact with the animal. People with severe allergies can experience reactions in public places if dander has been transported on the pet owners' clothing. Keeping an animal outdoors is only a partial solution, since homes with pets in the yard still have higher concentrations of animal allergens. Before getting a pet, ask your allergist to determine if you are allergic to animals. If your pet is already considered part of your family, try to minimize contact and keep the pet out of the bedroom and other rooms where you spend a great deal of time. As with dust mites, vacuum carpets often or replace carpet with a hardwood floor, tile or linoleum. High-efficiency particulate air (HEPA) cleaners can reduce allergen levels over time. While dander and saliva are the source of cat and dog allergens, urine is the source of allergens from rabbits, hamsters, mice and g[place]pigs; so ask a non-allergic family member to clean the animal's cage. If you have a pet allergy, talk to your allergist about the potential for allergy immunotherapy (allergy shots). This strategy can often provide long-term relief.

## 2022-01-12 NOTE — Assessment & Plan Note (Signed)
Past history - Having issues with voice hoarseness and not sure when this started.  Interim history - unchanged even with daily PPI.  Refer Dr. Rowe Clack (ENT who specializes in vocal cords).

## 2022-01-15 ENCOUNTER — Ambulatory Visit: Payer: Commercial Managed Care - HMO | Attending: Cardiology | Admitting: Student

## 2022-01-15 ENCOUNTER — Other Ambulatory Visit (HOSPITAL_COMMUNITY): Payer: Self-pay

## 2022-01-15 VITALS — Wt 238.0 lb

## 2022-01-15 DIAGNOSIS — E669 Obesity, unspecified: Secondary | ICD-10-CM | POA: Diagnosis not present

## 2022-01-15 DIAGNOSIS — E7849 Other hyperlipidemia: Secondary | ICD-10-CM | POA: Diagnosis not present

## 2022-01-15 MED ORDER — EZETIMIBE 10 MG PO TABS: 10.0000 mg | ORAL_TABLET | Freq: Every day | ORAL | 3 refills | Status: DC

## 2022-01-15 NOTE — Progress Notes (Unsigned)
Patient ID: Cathy Baxter                 DOB: [birth year]                    MRN: [id number]      HPI: Cathy Baxter is a 62 y.o. female patient referred to weight loss clinic by Dr.Tobb. PMH is significant for asthma, HTN, and HLD.   Patient is concern about her weight. Her PCP had reduced her thyroid medication dose and she had gained 2 pounds. She does not have appetite and she has to force her self to eat sometime. She never  Reviewed options for lowering LDL cholesterol, including ezetimibe, PCSK-9 inhibitors, bempedoic acid and inclisiran.  Discussed mechanisms of action, dosing, side effects and potential decreases in LDL cholesterol.  Also reviewed cost information and potential options for patient assistance.  Current Medications: Livalo (pitavastain) 4 mg   Intolerances: atorvastatin - tiredness, extreme drowsiness, rosuvastatin  Risk Factors: hypertension, obesity  LDL goal: <100 mg/dl  Diet: breakfast - eggs bacon  Lunch - sub sandwiches or home made sandwiches   Dinner - bake and air fried food  Cathy Baxter her own food - eats lots of vegetables  Baked potatoes with sour cream  Coffees- 1-2 in the morning and in the evening 1- 2  Drink: water, small cans of sprite but not often   Exercise: none due to knee and back pain   Family History: mom - atrial fibriliation, MI   Social History:  Alcohol:none  Tobacco: Never Receational drug use: never   Labs: Lipid Panel     Component Value Date/Time   CHOL 256 (H) 12/14/2021 1508   TRIG 116 12/14/2021 1508   HDL 68 12/14/2021 1508   CHOLHDL 3.8 12/14/2021 1508   LDLCALC 168 (H) 12/14/2021 1508   LABVLDL 20 12/14/2021 1508    Past Medical History:  Diagnosis Date   Allergy    Anemia    Arthritis    Asthma    Chicken pox    Depression    Fatty liver    GERD (gastroesophageal reflux disease)    Hiatal hernia    Hyperlipidemia    Pigmentary glaucoma of both eyes    Polyp of stomach    Shingles    Thyroid disease     Urinary incontinence    Uterine fibroid    Uterine fibroids in pregnancy, postpartum condition 11/06/2015    Current Outpatient Medications on File Prior to Visit  Medication Sig Dispense Refill   albuterol (VENTOLIN HFA) 108 (90 Base) MCG/ACT inhaler Inhale 2 puffs into the lungs every 4 (four) hours as needed for wheezing or shortness of breath (coughing fits). 18 g 1   brimonidine (ALPHAGAN) 0.2 % ophthalmic solution Place 1 drop into both eyes 2 (two) times daily.     cefdinir (OMNICEF) 300 MG capsule Take 1 capsule (300 mg total) by mouth 2 (two) times daily. 20 capsule 0   Cholecalciferol (VITAMIN D3) 75 MCG (3000 UT) TABS Take 3,000 Units by mouth daily.     diclofenac (VOLTAREN) 75 MG EC tablet Take 75 mg by mouth 2 (two) times daily. (Patient not taking: Reported on 12/14/2021)     dorzolamide-timolol (COSOPT) 22.3-6.8 MG/ML ophthalmic solution Place 1 drop into both eyes 2 (two) times daily.      fluticasone-salmeterol (ADVAIR) 250-50 MCG/ACT AEPB Inhale 1 puff into the lungs in the morning and at bedtime. Rinse mouth after each use. 60 each  2   furosemide (LASIX) 40 MG tablet Take 1 tablet (40 mg total) by mouth daily for 5 days. 5 tablet 0   levothyroxine (SYNTHROID) 25 MCG tablet Take 1 tablet (25 mcg total) by mouth daily before breakfast. 90 tablet 3   mometasone (ELOCON) 0.1 % cream APPLY TO EXTERNAL EAR ONCE DAILY AS NEEDED ITCHING     omeprazole (PRILOSEC) 20 MG capsule Take 1 capsule (20 mg total) by mouth daily. Nothing to eat or drink for 30 minutes. 30 capsule 2   Pitavastatin Calcium 4 MG TABS Take 1 tablet (4 mg total) by mouth daily. 30 tablet 11   potassium chloride SA (KLOR-CON M20) 20 MEQ tablet Take 1 tablet (20 mEq total) by mouth daily for 5 days. 5 tablet 0   vitamin B-12 (CYANOCOBALAMIN) 1000 MCG tablet Take 1,000 mcg by mouth daily.     No current facility-administered medications on file prior to visit.    Allergies  Allergen Reactions   Azithromycin  Palpitations    Arrhythmia side effects   Oxycodone Shortness Of Breath   Penicillins Anaphylaxis and Itching   Percocet [Oxycodone-Acetaminophen] Shortness Of Breath   Amoxicillin Hives    Has patient had a PCN reaction causing immediate rash, facial/tongue/throat swelling, SOB or lightheadedness with hypotension: Yes Has patient had a PCN reaction causing severe rash involving mucus membranes or skin necrosis: No Has patient had a PCN reaction that required hospitalization No Has patient had a PCN reaction occurring within the last 10 years: Yes  If all of the above answers are "NO", then may proceed with Cephalosporin use.    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    Demerol [Meperidine] Palpitations    Assessment/Plan:  1. Hyperlipidemia -  No problems updated. No problem-specific Assessment & Plan notes found for this encounter.    Thank you,  Cathy Baxter, Pharm.D [facility] [address] [place], [place] [address]  Phone: [phone]; Fax: [phone]

## 2022-01-16 MED ORDER — SEMAGLUTIDE-WEIGHT MANAGEMENT 0.25 MG/0.5ML ~~LOC~~ SOAJ: 0.2500 mg | SUBCUTANEOUS | 0 refills | Status: AC

## 2022-01-16 NOTE — Assessment & Plan Note (Signed)
Assessment:  LDL goal: < 100 mg/dl last LDLc 168 mg/dl (12/14/2021) off of the statins Started taking pitavastain 4 mg about month ago, tolerates it without any side effects  Intolerance to  rosuvastatin and atorvastatin - extreme tiredness and drowsiness In setting of intolerance to high intensity statins for primary prevention adding ezetimibe would be reasonable next step; patient is in agreement   Plan: Continue taking current medications (pitavastatin 4 mg) Start taking Ezetimibe 10 mg daily  Will repeat FLP in 3 months to assess effectiveness of the therapy

## 2022-01-16 NOTE — Assessment & Plan Note (Signed)
Assessment:  Baseline weight 238 lbs (01/15/2021) goal is to loose 5 lbs in 3 months  Does not have large apatite; have to force herself to eat regularly  Current diet is not intended to watch for fat, carb or protein  Active around the house but no regular exercise regimen (due to knee and back pain)  Discussed GLP1 therapy for weight loss in detail (MOA, dose titration and target dose for weight loss, and potential common side effects) Patient want to try life style modifications first before going on GLP1  Plan: Start eating healthy diet Avoid red meat and deli meat, eat lean meat (grilled, baked or air fried)  Cut down on dressing amount on the salads  Avoid fatty dairy products, switch to low fat dairy  Start modified chair yoga  at least 10 min twice daily initially with 10-15 min walks twice daily with aim of reaching to 20 min twice daily for both walking and yoga in 6-12 weeks  Will apply for PA for insurance preferred GLP1 agent and will notify patient upon approval

## 2022-01-18 ENCOUNTER — Other Ambulatory Visit (HOSPITAL_COMMUNITY): Payer: Self-pay

## 2022-01-18 ENCOUNTER — Telehealth: Payer: Self-pay | Admitting: Pharmacist

## 2022-01-18 NOTE — Telephone Encounter (Signed)
Applied for [employer] PA to [employer] that patient has. It says it does not required PA and it is covered but the pharmacy confirms when they run it through [place] price is same as [employer]. Patient 2nd insurance- Medicaid does not covers weight loss medications.  Patient has been informed via [place].

## 2022-01-19 NOTE — Telephone Encounter (Signed)
Patient is scheduled to see Dr Josph Macho on 03/22/2022 @ 1:30 PM. MyChart message sent to the patient.

## 2022-01-29 ENCOUNTER — Telehealth: Payer: Self-pay | Admitting: *Deleted

## 2022-01-29 NOTE — Telephone Encounter (Addendum)
Prior Authorization for split night sleep study sent to [facility] 01/01/22 via Phone.  Called to check status. It was approved.Reference # [id number].  Valid dates 01/01/22 to 04/01/22. Per [employer] no PA is required. Reference # [id number]

## 2022-02-01 ENCOUNTER — Telehealth: Payer: Self-pay | Admitting: Cardiology

## 2022-02-01 NOTE — Telephone Encounter (Signed)
Patient stated she took Zetia 2 nights ago, and about 2 hours later, felt her cheeks "on fire." She stated her cheeks were very red up to her eyes, and she felt dizzy. She did not take zetia last night. Encouraged her to stay hydrated. Please advise.

## 2022-02-01 NOTE — Telephone Encounter (Signed)
Patient advised to stop taking zetia per DR. Tobb. Medication removed from med list..

## 2022-02-01 NOTE — Telephone Encounter (Signed)
Pt c/o medication issue:  1. Name of Medication: ezetimibe (ZETIA) 10 MG tablet [[id number]]   2. How are you currently taking this medication (dosage and times per day)?  10 mg 1 time daily  3. Are you having a reaction (difficulty breathing--STAT)? Na   4. What is your medication issue? Pt stated it made her face red and was burning like fire and she stated she could not sleep on this med.  She did not take any last night.  She stated she may need to switch to something else   Best number  [phone]

## 2022-02-09 ENCOUNTER — Telehealth: Payer: Self-pay | Admitting: Cardiology

## 2022-02-09 NOTE — Telephone Encounter (Signed)
Called patient, she states she was started on this in December, and believes this is causing her issues below. She would like to know what else to be done.   I see that pharmacy team was discussing Repatha, possibly other medications as patient seems to have reactions to medication.   Will route to [facility], and MD to review and give further recommendations.   Thanks!

## 2022-02-09 NOTE — Telephone Encounter (Signed)
Called patient, she stated she would like to go ahead and see what insurance will say about Repatha. She would like that information.   Thanks!

## 2022-02-09 NOTE — Telephone Encounter (Signed)
Pt c/o medication issue:  1. Name of Medication:   Pitavastatin Calcium 4 MG TABS   2. How are you currently taking this medication (dosage and times per day)?   As prescribed  3. Are you having a reaction (difficulty breathing--STAT)?   No  4. What is your medication issue?   Patient stated since taking this medication she has had dizzy spells, feeling weak, tiredness, sleeping a lot and hurting all over.  Patient would like advice on next steps.

## 2022-02-09 NOTE — Telephone Encounter (Signed)
Saw patient last Summer and recommended starting Repatha. Will be happy to complete PA if patient is now willing to start

## 2022-02-11 ENCOUNTER — Other Ambulatory Visit (HOSPITAL_COMMUNITY): Payer: Self-pay

## 2022-02-11 NOTE — Telephone Encounter (Signed)
Pt calling back because she is still unsure whether she needs to stop the Pitavastatin Calcium 4 MG TABS

## 2022-02-11 NOTE — Telephone Encounter (Signed)
Patient wanted to know if she should stop taking pitavastatin because of body aches. She said she hasn't taken it for 2 days. Please advise.

## 2022-02-11 NOTE — Telephone Encounter (Signed)
PA required when I test billed to primary and secondary. Starting PA on primary [employer].   Received notification from [facility] that prior authorization for REPATHA 140 MG/ML INJ is needed.    PA submitted on 02/11/22 Key [id number] Status is pending  Karie Soda, [place] Patient Advocate Specialist Direct Number: [phone] Fax: [phone]

## 2022-02-17 ENCOUNTER — Telehealth: Payer: Self-pay

## 2022-02-17 ENCOUNTER — Other Ambulatory Visit: Payer: Self-pay

## 2022-02-17 DIAGNOSIS — E7849 Other hyperlipidemia: Secondary | ICD-10-CM

## 2022-02-17 NOTE — Telephone Encounter (Signed)
Called pt to let her know to be expecting a call to schedule the CT scan. She verbalized understanding. No questions expressed at this time.

## 2022-02-17 NOTE — Telephone Encounter (Signed)
Patient can hold pitavastatin for the time being. Still waiting on response from prior authorization.

## 2022-02-17 NOTE — Progress Notes (Signed)
Order placed for CT calcium score per Dr. Terrial Rhodes request.

## 2022-02-18 NOTE — Telephone Encounter (Signed)
Spoke with patient and gave her PharmD reply: "Patient can hold pitavastatin for the time being. Still waiting on response from prior authorization."  Patient stated she is not as sleepy as she was while on pitavastatin and her aches have stopped. She wanted PharmD to know she also has Medicaid insurance for [employer].

## 2022-02-26 NOTE — Telephone Encounter (Signed)
Rph reviewing Repatha denial to advise.

## 2022-03-04 ENCOUNTER — Ambulatory Visit (HOSPITAL_BASED_OUTPATIENT_CLINIC_OR_DEPARTMENT_OTHER): Payer: Commercial Managed Care - HMO | Attending: Cardiology | Admitting: Cardiovascular Disease

## 2022-03-04 VITALS — Ht 63.0 in | Wt 236.0 lb

## 2022-03-04 DIAGNOSIS — G4736 Sleep related hypoventilation in conditions classified elsewhere: Secondary | ICD-10-CM | POA: Diagnosis not present

## 2022-03-04 DIAGNOSIS — R400 Somnolence: Secondary | ICD-10-CM | POA: Diagnosis not present

## 2022-03-04 DIAGNOSIS — R0683 Snoring: Secondary | ICD-10-CM | POA: Diagnosis not present

## 2022-03-04 DIAGNOSIS — E8881 Metabolic syndrome: Secondary | ICD-10-CM | POA: Insufficient documentation

## 2022-03-04 DIAGNOSIS — G478 Other sleep disorders: Secondary | ICD-10-CM

## 2022-03-12 ENCOUNTER — Encounter (HOSPITAL_BASED_OUTPATIENT_CLINIC_OR_DEPARTMENT_OTHER): Payer: Self-pay | Admitting: Cardiovascular Disease

## 2022-03-12 NOTE — Procedures (Signed)
Patient Name: Cathy Baxter, Cathy Baxter Date: 03/04/2022 Gender: Female D.O.B: [birth year] Age (years): 37 Referring Provider: Godfrey Pick Tobb DO Height (inches): 63 Interpreting Physician: Shelva Majestic MD, ABSM Weight (lbs): 236 RPSGT: Baxter Flattery BMI: 42 MRN: [id number] Neck Size: 15.00  CLINICAL INFORMATION Sleep Study Type: NPSG  Indication for sleep study: Snoring, Witnesses Apnea / Gasping During Sleep  Epworth Sleepiness Score: 15  Most recent polysomnogram dated 04/20/2020 revealed an AHI of 0.0/h and RDI of 1.0/h.  SLEEP STUDY TECHNIQUE As per the AASM Manual for the Scoring of Sleep and Associated Events v2.3 (April 2016) with a hypopnea requiring 4% desaturations.  The channels recorded and monitored were frontal, central and occipital EEG, electrooculogram (EOG), submentalis EMG (chin), nasal and oral airflow, thoracic and abdominal wall motion, anterior tibialis EMG, snore microphone, electrocardiogram, and pulse oximetry.  MEDICATIONS albuterol (VENTOLIN HFA) 108 (90 Base) MCG/ACT inhaler brimonidine (ALPHAGAN) 0.2 % ophthalmic solution cefdinir (OMNICEF) 300 MG capsule Cholecalciferol (VITAMIN D3) 75 MCG (3000 UT) TABS diclofenac (VOLTAREN) 75 MG EC tablet dorzolamide-timolol (COSOPT) 22.3-6.8 MG/ML ophthalmic solution fluticasone-salmeterol (ADVAIR) 250-50 MCG/ACT AEPB furosemide (LASIX) 40 MG tablet (Expired) levothyroxine (SYNTHROID) 25 MCG tablet mometasone (ELOCON) 0.1 % cream omeprazole (PRILOSEC) 20 MG capsule Pitavastatin Calcium 4 MG TABS potassium chloride SA (KLOR-CON M20) 20 MEQ tablet (Expired) vitamin B-12 (CYANOCOBALAMIN) 1000 MCG tablet Medications self-administered by patient taken the night of the study : N/A  SLEEP ARCHITECTURE The study was initiated at 10:29:32 PM and ended at 4:48:53 AM.  Sleep onset time was 12.9 minutes and the sleep efficiency was 61.4%. The total sleep time was 233 minutes.  Stage REM latency was 176.5  minutes.  The patient spent 2.6% of the night in stage N1 sleep, 91.6% in stage N2 sleep, 2.1% in stage N3 and 3.7% in REM.  Alpha intrusion was absent.  Supine sleep was 0.00%.  RESPIRATORY PARAMETERS The overall apnea/hypopnea index (AHI) was 4.1 per hour. The respiratory disturbance index (RDI) was 4.1/h. There were 5 total apneas, including 0 obstructive, 2 central and 3 mixed apneas. There were 11 hypopneas and 0 RERAs.  The AHI during Stage REM sleep was 0.0 per hour.  AHI while supine was N/A per hour.  The mean oxygen saturation was 91.8%. The minimum SpO2 during sleep was 85.0%.  Moderate snoring was noted during this study.  CARDIAC DATA The 2 lead EKG demonstrated sinus rhythm. The mean heart rate was 73.3 beats per minute. Other EKG findings include: None.  LEG MOVEMENT DATA The total PLMS were 0 with a resulting PLMS index of 0.0. Associated arousal with leg movement index was 0.0 .  IMPRESSIONS - No significant obstructive sleep apnea occurred during this study (AHI 4.1/h). Increased upper airway resistance.  - No significant central sleep apnea occurred during this study (CAI 0.5/h). - Mild oxygen desaturation to a nadir of 85%. - The patient snored with moderate snoring volume. - Reduced sleep efficiency at 61.4%. Wake after sleep onset (WASO) was 133.4 minutes. - No cardiac abnormalities were noted during this study. - Clinically significant periodic limb movements did not occur during sleep. No significant associated arousals.  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  - Effort should be made to optimize nasal and oropharyngeal patency.  - If patient continues to have significant daytime sleepiness (ESS 15), consider a follow-up sleep study with multiple latency sleep test (  MLST) to asses for idiopathic hypersomnia or  narcolepsy. - Avoid alcohol, sedatives and other CNS depressants that may worsen sleep apnea and disrupt normal sleep architecture. - Sleep hygiene should be reviewed to assess factors that may improve sleep quality. - Weight management (BMI 42) and regular exercise should be initiated or continued if appropriate.  [Electronically signed] 03/12/2022 02:26 PM  Shelva Majestic MD, [facility], ABSM Diplomate, American Board of Sleep Medicine  NPI: [id number]  [place] PH: [phone]   FX: [phone] [place]

## 2022-03-16 ENCOUNTER — Telehealth: Payer: Self-pay | Admitting: Cardiology

## 2022-03-16 ENCOUNTER — Ambulatory Visit: Payer: Commercial Managed Care - HMO | Admitting: Allergy

## 2022-03-16 ENCOUNTER — Ambulatory Visit (HOSPITAL_BASED_OUTPATIENT_CLINIC_OR_DEPARTMENT_OTHER): Payer: No Typology Code available for payment source

## 2022-03-16 NOTE — Telephone Encounter (Signed)
Pt called to follow up on sleep studies results

## 2022-03-17 ENCOUNTER — Ambulatory Visit (HOSPITAL_BASED_OUTPATIENT_CLINIC_OR_DEPARTMENT_OTHER)
Admission: RE | Admit: 2022-03-17 | Discharge: 2022-03-17 | Disposition: A | Discharge status: Home / Self Care | Payer: No Typology Code available for payment source | Source: Ambulatory Visit | Attending: Cardiology | Admitting: Cardiology

## 2022-03-17 ENCOUNTER — Encounter (HOSPITAL_BASED_OUTPATIENT_CLINIC_OR_DEPARTMENT_OTHER): Payer: Self-pay

## 2022-03-17 DIAGNOSIS — E7849 Other hyperlipidemia: Secondary | ICD-10-CM | POA: Insufficient documentation

## 2022-03-18 ENCOUNTER — Ambulatory Visit (INDEPENDENT_AMBULATORY_CARE_PROVIDER_SITE_OTHER): Payer: Commercial Managed Care - HMO | Admitting: Allergy

## 2022-03-18 ENCOUNTER — Other Ambulatory Visit: Payer: Self-pay

## 2022-03-18 ENCOUNTER — Encounter: Payer: Self-pay | Admitting: Allergy

## 2022-03-18 VITALS — BP 118/70 | HR 79 | Temp 97.9°F | Resp 16

## 2022-03-18 DIAGNOSIS — J4540 Moderate persistent asthma, uncomplicated: Secondary | ICD-10-CM

## 2022-03-18 DIAGNOSIS — R490 Dysphonia: Secondary | ICD-10-CM

## 2022-03-18 DIAGNOSIS — K219 Gastro-esophageal reflux disease without esophagitis: Secondary | ICD-10-CM

## 2022-03-18 DIAGNOSIS — J3089 Other allergic rhinitis: Secondary | ICD-10-CM

## 2022-03-18 MED ORDER — LEVOCETIRIZINE DIHYDROCHLORIDE 5 MG PO TABS: 2.5000 mg | ORAL_TABLET | Freq: Every evening | ORAL | 5 refills | Status: DC

## 2022-03-18 MED ORDER — FAMOTIDINE 20 MG PO TABS: 20.0000 mg | ORAL_TABLET | Freq: Two times a day (BID) | ORAL | 5 refills | Status: AC

## 2022-03-18 MED ORDER — FLUTICASONE-SALMETEROL 250-50 MCG/ACT IN AEPB: 1.0000 | INHALATION_SPRAY | Freq: Two times a day (BID) | RESPIRATORY_TRACT | 5 refills | Status: AC

## 2022-03-18 NOTE — Progress Notes (Signed)
Follow Up Note  RE: Cathy Baxter MRN: [id number] DOB: 07/14/1960 Date of Office Visit: 03/18/2022  Referring provider: Ma Hillock, DO Primary care provider: Ma Hillock, DO  Chief Complaint: Follow-up  History of Present Illness: I had the pleasure of seeing Cathy Baxter for a follow up visit at the [facility] on 03/18/2022. She is a 62 y.o. female, who is being followed for asthma, voice hoarseness and allergic rhinitis. Her previous allergy office visit was on 01/12/2022 with Dr. Maudie Mercury. Today is a regular follow up visit. Patient is not a very good historian.    Asthma Currently on Advair 278mg 1 puff 1-2 times per day. Sometimes forgets to take the second dose. Denies any SOB, coughing, chest tightness, nocturnal awakenings, ER/urgent care visits or prednisone use since the last visit.  Patient is still sometimes wheezy and uses albuterol prn with good benefit. She can't recall how often this happens or what seems to trigger it.   Voice hoarseness Has appointment with ENT in May. Unchanged.    Allergic rhinitis Taking Xyzal 1/2 tablet at night with good benefit. Saw ophthalmology and glaucoma is stable.   Had sleep study done 2 weeks ago and unsure of results.    Taking famotidine daily for her GERD with good benefit. The omeprazole made it worse.   Assessment and Plan: [name] a 62y.o. female with: Asthma Past history - Issues with her breathing for 20+ years.  Currently on Advair 250 mcg 1 puff twice a day x1 year and albuterol as needed with good benefit.  [name] the past.  Using albuterol less than once per week.  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  Takes Nexium as needed for reflux.  Seen cardiology as well. Interim history - controlled with some wheezing at times. No prednisone.  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  Today's spirometry was unremarkable.  Daily  controller medication(s):  Take Advair 2566m 1 puff twice a day and rinse mouth after each use.  May use albuterol rescue inhaler 2 puffs every 4 to 6 hours as needed for shortness of breath, chest tightness, coughing, and wheezing. May use albuterol rescue inhaler 2 puffs 5 to 15 minutes prior to strenuous physical activities. Monitor frequency of use.  Get spirometry at next visit. Consider biologics next if symptoms worsen.  Voice hoarseness Past history - Having issues with voice hoarseness and not sure when this started. No change with PPI. Interim history - unchanged. Keep ENT appointment in May.  Other allergic rhinitis Past history - Perennial rhinitis symptoms with no specific triggers. 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  Skin testing in 2014 was positive to dust mites, cat, dog, horse, feathers, grass, trees, pigweed, mold.  She was on allergy immunotherapy for less than 1 year.  She also had sinus surgery in 2000. 2022 skin testing showed: Positive to cats, dogs.  Negative to common foods. No pets at home.  ENT evaluation (no polyps, empty nose syndrome). Interim history - controlled. Continue environmental control measures as below. Try to take levocetirizine 2.'5mg'$  (1/2 tablet) once a day at night as needed. Nasal saline spray (i.e., Simply Saline) or nasal saline lavage (i.e., NeilMed) is recommended as needed and prior to medicated nasal sprays. 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   Gastroesophageal reflux disease Omeprazole worsened symptoms? Continue lifestyle and dietary modifications. Continue famotidine (Pepcid) '20mg'$  1-2 times  per day.   Return in about 3 months (around 06/18/2022).  Meds ordered this encounter  Medications   famotidine (PEPCID) 20 MG tablet    Sig: Take 1 tablet (20 mg total) by mouth 2 (two) times daily.    Dispense:  60 tablet    Refill:  5   levocetirizine (XYZAL) 5 MG tablet    Sig: Take 0.5 tablets (2.5 mg total) by mouth every  evening.    Dispense:  30 tablet    Refill:  5   fluticasone-salmeterol (ADVAIR) 250-50 MCG/ACT AEPB    Sig: Inhale 1 puff into the lungs in the morning and at bedtime. Rinse mouth after each use.    Dispense:  60 each    Refill:  5   Lab Orders  No laboratory test(s) ordered today    Diagnostics: Spirometry:  Tracings reviewed. Her effort: Good reproducible efforts. FVC: 2.24L FEV1: 1.71L, 74% predicted FEV1/FVC ratio: 76% Interpretation: No overt abnormalities noted given today's efforts.  Please see scanned spirometry results for details.  Medication List:  Current Outpatient Medications  Medication Sig Dispense Refill   albuterol (VENTOLIN HFA) 108 (90 Base) MCG/ACT inhaler Inhale 2 puffs into the lungs every 4 (four) hours as needed for wheezing or shortness of breath (coughing fits). 18 g 1   brimonidine (ALPHAGAN) 0.2 % ophthalmic solution Place 1 drop into both eyes 2 (two) times daily.     Cholecalciferol (VITAMIN D3) 75 MCG (3000 UT) TABS Take 3,000 Units by mouth daily.     dorzolamide-timolol (COSOPT) 22.3-6.8 MG/ML ophthalmic solution Place 1 drop into both eyes 2 (two) times daily.      famotidine (PEPCID) 20 MG tablet Take 1 tablet (20 mg total) by mouth 2 (two) times daily. 60 tablet 5   levocetirizine (XYZAL) 5 MG tablet Take 0.5 tablets (2.5 mg total) by mouth every evening. 30 tablet 5   levothyroxine (SYNTHROID) 25 MCG tablet Take 1 tablet (25 mcg total) by mouth daily before breakfast. 90 tablet 3   vitamin B-12 (CYANOCOBALAMIN) 1000 MCG tablet Take 1,000 mcg by mouth daily.     fluticasone-salmeterol (ADVAIR) 250-50 MCG/ACT AEPB Inhale 1 puff into the lungs in the morning and at bedtime. Rinse mouth after each use. 60 each 5   furosemide (LASIX) 40 MG tablet Take 1 tablet (40 mg total) by mouth daily for 5 days. 5 tablet 0   Pitavastatin Calcium 4 MG TABS Take 1 tablet (4 mg total) by mouth daily. 30 tablet 11   potassium chloride SA (KLOR-CON M20) 20 MEQ  tablet Take 1 tablet (20 mEq total) by mouth daily for 5 days. 5 tablet 0   No current facility-administered medications for this visit.   Allergies: Allergies  Allergen Reactions   Azithromycin Palpitations    Arrhythmia side effects   Oxycodone Shortness Of Breath   Penicillins Anaphylaxis and Itching   Percocet [Oxycodone-Acetaminophen] Shortness Of Breath   Amoxicillin Hives    Has patient had a PCN reaction causing immediate rash, facial/tongue/throat swelling, SOB or lightheadedness with hypotension: Yes Has patient had a PCN reaction causing severe rash involving mucus membranes or skin necrosis: No Has patient had a PCN reaction that required hospitalization No Has patient had a PCN reaction occurring within the last 10 years: Yes  If all of the above answers are "NO", then may proceed with Cephalosporin use.    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    Demerol [Meperidine] Palpitations  I reviewed her past medical history, social history, family history, and environmental history and no significant changes have been reported from her previous visit.  Review of Systems  Constitutional:  Negative for chills, fever and unexpected weight change.  HENT:  Positive for voice change. Negative for congestion and rhinorrhea.   Eyes:  Negative for itching.  Respiratory:  Negative for cough, chest tightness and shortness of breath.   Cardiovascular:  Negative for chest pain.  Gastrointestinal:  Negative for abdominal pain.  Genitourinary:  Negative for difficulty urinating.  Skin:  Negative for rash.  Allergic/Immunologic: Positive for environmental allergies.    Objective: BP 118/70   Pulse 79   Temp 97.9 F (36.6 C)   Resp 16   SpO2 95%  There is no height or weight on file to calculate BMI. Physical Exam Vitals and nursing note reviewed.  Constitutional:      Appearance: Normal appearance. She is well-developed.  HENT:     Head:  Normocephalic and atraumatic.     Right Ear: Tympanic membrane and external ear normal.     Left Ear: Tympanic membrane and external ear normal.     Nose: Nose normal.     Mouth/Throat:     Mouth: Mucous membranes are moist.     Pharynx: Oropharynx is clear.  Eyes:     Conjunctiva/sclera: Conjunctivae normal.  Cardiovascular:     Rate and Rhythm: Normal rate and regular rhythm.     Heart sounds: Normal heart sounds. No murmur heard. Pulmonary:     Effort: Pulmonary effort is normal.     Breath sounds: Normal breath sounds. No wheezing, rhonchi or rales.  Musculoskeletal:     Cervical back: Neck supple.  Skin:    General: Skin is warm.     Findings: No rash.  Neurological:     Mental Status: She is alert and oriented to person, place, and time.  Psychiatric:        Behavior: Behavior normal.    Previous notes and tests were reviewed. The plan was reviewed with the patient/family, and all questions/concerned were addressed.  It was my pleasure to see Shanyiah today and participate in her care. Please feel free to contact me with any questions or concerns.  Sincerely,  Rexene Alberts, DO Allergy & Immunology  [facility] office: [place] office: [phone]

## 2022-03-18 NOTE — Assessment & Plan Note (Signed)
Past history - Perennial rhinitis symptoms with no specific triggers. 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  Skin testing in 2014 was positive to dust mites, cat, dog, horse, feathers, grass, trees, pigweed, mold.  She was on allergy immunotherapy for less than 1 year.  She also had sinus surgery in 2000. 2022 skin testing showed: Positive to cats, dogs.  Negative to common foods. No pets at home.  ENT evaluation (no polyps, empty nose syndrome). Interim history - controlled. Continue environmental control measures as below. Try to take levocetirizine 2.'5mg'$  (1/2 tablet) once a day at night as needed. Nasal saline spray (i.e., Simply Saline) or nasal saline lavage (i.e., NeilMed) is recommended as needed and prior to medicated nasal sprays. 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

## 2022-03-18 NOTE — Assessment & Plan Note (Addendum)
Past history - Issues with her breathing for 20+ years.  Currently on Advair 250 mcg 1 puff twice a day x1 year and albuterol as needed with good benefit.  [name] in the past.  Using albuterol less than once per week.  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  Takes Nexium as needed for reflux.  Seen cardiology as well. Interim history - controlled with some wheezing at times. No prednisone.  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  Today's spirometry was unremarkable.  Daily controller medication(s):  Take Advair 233mg 1 puff twice a day and rinse mouth after each use.  May use albuterol rescue inhaler 2 puffs every 4 to 6 hours as needed for shortness of breath, chest tightness, coughing, and wheezing. May use albuterol rescue inhaler 2 puffs 5 to 15 minutes prior to strenuous physical activities. Monitor frequency of use.  Get spirometry at next visit. Consider biologics next if symptoms worsen.

## 2022-03-18 NOTE — Assessment & Plan Note (Signed)
Past history - Having issues with voice hoarseness and not sure when this started. No change with PPI. Interim history - unchanged. Keep ENT appointment in May.

## 2022-03-18 NOTE — Patient Instructions (Addendum)
Asthma: Daily controller medication(s):  Take Advair 221mg 1 puff twice a day and rinse mouth after each use.  May use albuterol rescue inhaler 2 puffs every 4 to 6 hours as needed for shortness of breath, chest tightness, coughing, and wheezing. May use albuterol rescue inhaler 2 puffs 5 to 15 minutes prior to strenuous physical activities. Monitor frequency of use.  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  Continue environmental control measures as below. Try to take levocetirizine 2.'5mg'$  (1/2 tablet) once a day at night as needed. Nasal saline spray (i.e., Simply Saline) or nasal saline lavage (i.e., NeilMed) is recommended as needed and prior to medicated nasal sprays. 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   Heartburn: Continue lifestyle and dietary modifications. Continue famotidine (Pepcid) '20mg'$  1-2 times per day.   Voice hoarseness Keep ENT appointment in May  Follow up in 3 months or sooner if needed.  Follow up with your ophthalmologist.   Pet Allergen Avoidance: Contrary to popular opinion, there are no "hypoallergenic" breeds of dogs or cats. That is because people are not allergic to an animal's hair, but to an allergen found in the animal's saliva, dander (dead skin flakes) or urine. Pet allergy symptoms typically occur within minutes. For some people, symptoms can build up and become most severe 8 to 12 hours after contact with the animal. People with severe allergies can experience reactions in public places if dander has been transported on the pet owners' clothing. Keeping an animal outdoors is only a partial solution, since  homes with pets in the yard still have higher concentrations of animal allergens. Before getting a pet, ask your allergist to determine if you are allergic to animals. If your pet is already considered part of your family, try to minimize contact and keep the pet out of the bedroom and other rooms where you spend a great deal of time. As with dust mites, vacuum carpets often or replace carpet with a hardwood floor, tile or linoleum. High-efficiency particulate air (HEPA) cleaners can reduce allergen levels over time. While dander and saliva are the source of cat and dog allergens, urine is the source of allergens from rabbits, hamsters, mice and g[place]pigs; so ask a non-allergic family member to clean the animal's cage. If you have a pet allergy, talk to your allergist about the potential for allergy immunotherapy (allergy shots). This strategy can often provide long-term relief.

## 2022-03-18 NOTE — Assessment & Plan Note (Addendum)
Omeprazole worsened symptoms? Continue lifestyle and dietary modifications. Continue famotidine (Pepcid) '20mg'$  1-2 times per day.

## 2022-03-19 NOTE — Telephone Encounter (Addendum)
Patient  notified of sleep study results. She does not wish to pursue any additional testing at this time. She will call back if she changes her mind.

## 2022-04-06 ENCOUNTER — Telehealth: Payer: Self-pay

## 2022-04-06 NOTE — Telephone Encounter (Signed)
Noted.  Patient declined today's appointment and scheduled for Thursday.

## 2022-04-06 NOTE — Telephone Encounter (Signed)
Patient called in stating that she has been having a red swollen face. Patient said that it has been going on for a few months but it has recently been getting worse. Patient said she feels her face congested. She had been taking 1/2 tablet of xyzal but thinks that it's the trigger for her symptoms. She said she did not take any yesterday or any today and that she was not going to take anymore. Patient said she has been feeling really sleepy. Patient mentioned that their has been no changed in her lifestyle that could be possibly triggering. Patient has an appointment scheduled for 04/08/2022 she stated she could not come into office due to a birthday going on. I informed patient if she experienced any worsening symptoms to go to the nearest Urgent Care or ED.

## 2022-04-07 NOTE — Progress Notes (Unsigned)
Follow Up Note  RE: Cathy Baxter MRN: [id number] DOB: October 14, 1960 Date of Office Visit: 04/08/2022  Referring provider: Ma Hillock, DO Primary care provider: Ma Hillock, DO  Chief Complaint: No chief complaint on file.  History of Present Illness: I had the pleasure of seeing Jonikka Lascola for a follow up visit at the [facility] on 04/07/2022. She is a 62 y.o. female, who is being followed for asthma, voice hoarseness, allergic rhinitis and GERD. Her previous allergy office visit was on 03/18/2022 with Dr. Maudie Mercury. Today is a new complaint visit of facial swelling .  Patient called in stating that she has been having a red swollen face. Patient said that it has been going on for a few months but it has recently been getting worse. Patient said she feels her face congested. She had been taking 1/2 tablet of xyzal but thinks that it's the trigger for her symptoms. She said she did not take any yesterday or any today and that she was not going to take anymore. Patient said she has been feeling really sleepy. Patient mentioned that their has been no changed in her lifestyle that could be possibly triggering. Patient has an appointment scheduled for 04/08/2022 she stated she could not come into office due to a birthday going on. I informed patient if she experienced any worsening symptoms to go to the nearest Urgent Care or ED.   Asthma Past history - Issues with her breathing for 20+ years.  Currently on Advair 250 mcg 1 puff twice a day x1 year and albuterol as needed with good benefit.  [name] in the past.  Using albuterol less than once per week.  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  Takes Nexium as needed for reflux.  Seen cardiology as well. Interim history - controlled with some wheezing at times. No prednisone.  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  Today's spirometry was unremarkable.  Daily  controller medication(s):  Take Advair 258mcg 1 puff twice a day and rinse mouth after each use.  May use albuterol rescue inhaler 2 puffs every 4 to 6 hours as needed for shortness of breath, chest tightness, coughing, and wheezing. May use albuterol rescue inhaler 2 puffs 5 to 15 minutes prior to strenuous physical activities. Monitor frequency of use.  Get spirometry at next visit. Consider biologics next if symptoms worsen.   Voice hoarseness Past history - Having issues with voice hoarseness and not sure when this started. No change with PPI. Interim history - unchanged. Keep ENT appointment in May.   Other allergic rhinitis Past history - Perennial rhinitis symptoms with no specific triggers. 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  Skin testing in 2014 was positive to dust mites, cat, dog, horse, feathers, grass, trees, pigweed, mold.  She was on allergy immunotherapy for less than 1 year.  She also had sinus surgery in 2000. 2022 skin testing showed: Positive to cats, dogs.  Negative to common foods. No pets at home.  ENT evaluation (no polyps, empty nose syndrome). Interim history - controlled. Continue environmental control measures as below. Try to take levocetirizine 2.5mg  (1/2 tablet) once a day at night as needed. Nasal saline spray (i.e., Simply Saline) or nasal saline lavage (i.e., NeilMed) is recommended as needed and prior to medicated nasal sprays. 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    Gastroesophageal reflux disease Omeprazole worsened symptoms? Continue lifestyle and  dietary modifications. Continue famotidine (Pepcid) 20mg  1-2 times per day.   Assessment and Plan: Zendaya is a 62 y.o. female with: No problem-specific Assessment & Plan notes found for this encounter.  No follow-ups on file.  No orders of the defined types were placed in this encounter.  Lab Orders  No laboratory test(s) ordered today    Diagnostics: Spirometry:  Tracings reviewed.  Her effort: {Blank single:19197::"Good reproducible efforts.","It was hard to get consistent efforts and there is a question as to whether this reflects a maximal maneuver.","Poor effort, data can not be interpreted."} FVC: ***L FEV1: ***L, ***% predicted FEV1/FVC ratio: ***% Interpretation: {Blank single:19197::"Spirometry consistent with mild obstructive disease","Spirometry consistent with moderate obstructive disease","Spirometry consistent with severe obstructive disease","Spirometry consistent with possible restrictive disease","Spirometry consistent with mixed obstructive and restrictive disease","Spirometry uninterpretable due to technique","Spirometry consistent with normal pattern","No overt abnormalities noted given today's efforts"}.  Please see scanned spirometry results for details.  Skin Testing: {Blank single:19197::"Select foods","Environmental allergy panel","Environmental allergy panel and select foods","Food allergy panel","None","Deferred due to recent antihistamines use"}. *** Results discussed with patient/family.   Medication List:  Current Outpatient Medications  Medication Sig Dispense Refill   albuterol (VENTOLIN HFA) 108 (90 Base) MCG/ACT inhaler Inhale 2 puffs into the lungs every 4 (four) hours as needed for wheezing or shortness of breath (coughing fits). 18 g 1   brimonidine (ALPHAGAN) 0.2 % ophthalmic solution Place 1 drop into both eyes 2 (two) times daily.     Cholecalciferol (VITAMIN D3) 75 MCG (3000 UT) TABS Take 3,000 Units by mouth daily.     dorzolamide-timolol (COSOPT) 22.3-6.8 MG/ML ophthalmic solution Place 1 drop into both eyes 2 (two) times daily.      famotidine (PEPCID) 20 MG tablet Take 1 tablet (20 mg total) by mouth 2 (two) times daily. 60 tablet 5   fluticasone-salmeterol (ADVAIR) 250-50 MCG/ACT AEPB Inhale 1 puff into the lungs in the morning and at bedtime. Rinse mouth after each use. 60 each 5   furosemide (LASIX) 40 MG tablet Take 1 tablet  (40 mg total) by mouth daily for 5 days. 5 tablet 0   levocetirizine (XYZAL) 5 MG tablet Take 0.5 tablets (2.5 mg total) by mouth every evening. 30 tablet 5   levothyroxine (SYNTHROID) 25 MCG tablet Take 1 tablet (25 mcg total) by mouth daily before breakfast. 90 tablet 3   Pitavastatin Calcium 4 MG TABS Take 1 tablet (4 mg total) by mouth daily. 30 tablet 11   potassium chloride SA (KLOR-CON M20) 20 MEQ tablet Take 1 tablet (20 mEq total) by mouth daily for 5 days. 5 tablet 0   vitamin B-12 (CYANOCOBALAMIN) 1000 MCG tablet Take 1,000 mcg by mouth daily.     No current facility-administered medications for this visit.   Allergies: Allergies  Allergen Reactions   Azithromycin Palpitations    Arrhythmia side effects   Oxycodone Shortness Of Breath   Penicillins Anaphylaxis and Itching   Percocet [Oxycodone-Acetaminophen] Shortness Of Breath   Amoxicillin Hives    Has patient had a PCN reaction causing immediate rash, facial/tongue/throat swelling, SOB or lightheadedness with hypotension: Yes Has patient had a PCN reaction causing severe rash involving mucus membranes or skin necrosis: No Has patient had a PCN reaction that required hospitalization No Has patient had a PCN reaction occurring within the last 10 years: Yes  If all of the above answers are "NO", then may proceed with Cephalosporin use.    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  Demerol [Meperidine] Palpitations   I reviewed her past medical history, social history, family history, and environmental history and no significant changes have been reported from her previous visit.  Review of Systems  Constitutional:  Negative for chills, fever and unexpected weight change.  HENT:  Positive for voice change. Negative for congestion and rhinorrhea.   Eyes:  Negative for itching.  Respiratory:  Negative for cough, chest tightness and shortness of breath.   Cardiovascular:  Negative for chest pain.   Gastrointestinal:  Negative for abdominal pain.  Genitourinary:  Negative for difficulty urinating.  Skin:  Negative for rash.  Allergic/Immunologic: Positive for environmental allergies.    Objective: There were no vitals taken for this visit. There is no height or weight on file to calculate BMI. Physical Exam Vitals and nursing note reviewed.  Constitutional:      Appearance: Normal appearance. She is well-developed.  HENT:     Head: Normocephalic and atraumatic.     Right Ear: Tympanic membrane and external ear normal.     Left Ear: Tympanic membrane and external ear normal.     Nose: Nose normal.     Mouth/Throat:     Mouth: Mucous membranes are moist.     Pharynx: Oropharynx is clear.  Eyes:     Conjunctiva/sclera: Conjunctivae normal.  Cardiovascular:     Rate and Rhythm: Normal rate and regular rhythm.     Heart sounds: Normal heart sounds. No murmur heard. Pulmonary:     Effort: Pulmonary effort is normal.     Breath sounds: Normal breath sounds. No wheezing, rhonchi or rales.  Musculoskeletal:     Cervical back: Neck supple.  Skin:    General: Skin is warm.     Findings: No rash.  Neurological:     Mental Status: She is alert and oriented to person, place, and time.  Psychiatric:        Behavior: Behavior normal.    Previous notes and tests were reviewed. The plan was reviewed with the patient/family, and all questions/concerned were addressed.  It was my pleasure to see Sherene today and participate in her care. Please feel free to contact me with any questions or concerns.  Sincerely,  Rexene Alberts, DO Allergy & Immunology  [facility] office: [place] office: [phone]

## 2022-04-08 ENCOUNTER — Encounter: Payer: Self-pay | Admitting: Allergy

## 2022-04-08 ENCOUNTER — Other Ambulatory Visit: Payer: Self-pay

## 2022-04-08 ENCOUNTER — Ambulatory Visit (INDEPENDENT_AMBULATORY_CARE_PROVIDER_SITE_OTHER): Payer: Commercial Managed Care - HMO | Admitting: Allergy

## 2022-04-08 VITALS — BP 136/86 | HR 85 | Temp 97.5°F | Resp 16 | Wt 238.8 lb

## 2022-04-08 DIAGNOSIS — J3089 Other allergic rhinitis: Secondary | ICD-10-CM

## 2022-04-08 DIAGNOSIS — R490 Dysphonia: Secondary | ICD-10-CM | POA: Diagnosis not present

## 2022-04-08 DIAGNOSIS — K219 Gastro-esophageal reflux disease without esophagitis: Secondary | ICD-10-CM | POA: Diagnosis not present

## 2022-04-08 DIAGNOSIS — J019 Acute sinusitis, unspecified: Secondary | ICD-10-CM

## 2022-04-08 DIAGNOSIS — J4540 Moderate persistent asthma, uncomplicated: Secondary | ICD-10-CM

## 2022-04-08 MED ORDER — CEFDINIR 300 MG PO CAPS: 300.0000 mg | ORAL_CAPSULE | Freq: Two times a day (BID) | ORAL | 0 refills | Status: DC

## 2022-04-08 NOTE — Assessment & Plan Note (Signed)
Past history - Having issues with voice hoarseness and not sure when this started. No change with PPI. Keep ENT appointment in May.

## 2022-04-08 NOTE — Assessment & Plan Note (Signed)
Past history - Perennial rhinitis symptoms with no specific triggers. 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  Skin testing in 2014 was positive to dust mites, cat, dog, horse, feathers, grass, trees, pigweed, mold.  She was on allergy immunotherapy for less than 1 year.  She also had sinus surgery in 2000. 2022 skin testing showed: Positive to cats, dogs.  Negative to common foods. No pets at home.  ENT evaluation (no polyps, empty nose syndrome). Interim history - Xyzal made her congestion worse. Continue environmental control measures as below. Nasal saline spray (i.e., Simply Saline) or nasal saline lavage (i.e., NeilMed) is recommended as needed. 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

## 2022-04-08 NOTE — Patient Instructions (Addendum)
Possible sinus infection Start cefdinir 300mg  twice a day for 7 days.  May use over the counter decongestants for a few days. You may use afrin nasal spray for a few days for severe nasal congestion.   Take pictures of the facial/cheek swelling.   Asthma: Daily controller medication(s):  Take Advair 232mcg 1 puff twice a day and rinse mouth after each use.  May use albuterol rescue inhaler 2 puffs every 4 to 6 hours as needed for shortness of breath, chest tightness, coughing, and wheezing. May use albuterol rescue inhaler 2 puffs 5 to 15 minutes prior to strenuous physical activities. Monitor frequency of use.  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  Continue environmental control measures as below. Nasal saline spray (i.e., Simply Saline) or nasal saline lavage (i.e., NeilMed) is recommended as needed. 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   Heartburn: Continue lifestyle and dietary modifications. Continue famotidine (Pepcid) 20mg  1-2 times per day.   Voice hoarseness Keep ENT appointment in May  Follow up in 3 months or sooner if needed.  Follow up with your ophthalmologist.   Pet Allergen Avoidance: Contrary to popular opinion, there are no "hypoallergenic" breeds of dogs or cats. That is because people are not allergic to an animal's hair, but to an allergen found in the animal's saliva, dander (dead skin flakes) or urine. Pet allergy symptoms typically occur within minutes. For some people, symptoms can build up and become most severe 8 to 12 hours after contact with the animal. People with severe allergies can experience  reactions in public places if dander has been transported on the pet owners' clothing. Keeping an animal outdoors is only a partial solution, since homes with pets in the yard still have higher concentrations of animal allergens. Before getting a pet, ask your allergist to determine if you are allergic to animals. If your pet is already considered part of your family, try to minimize contact and keep the pet out of the bedroom and other rooms where you spend a great deal of time. As with dust mites, vacuum carpets often or replace carpet with a hardwood floor, tile or linoleum. High-efficiency particulate air (HEPA) cleaners can reduce allergen levels over time. While dander and saliva are the source of cat and dog allergens, urine is the source of allergens from rabbits, hamsters, mice and [place] pigs; so ask a non-allergic family member to clean the animal's cage. If you have a pet allergy, talk to your allergist about the potential for allergy immunotherapy (allergy shots). This strategy can often provide long-term relief.

## 2022-04-08 NOTE — Assessment & Plan Note (Signed)
Noted cheek swelling, nasal congestion since last week. Denies fevers. No issues with omnicef in the past. Will treat for possible sinus infection. Start cefdinir 300mg  twice a day for 7 days.  May use over the counter decongestants for a few days. You may use afrin nasal spray for a few days for severe nasal congestion.  Take pictures of the facial/cheek swelling.  No oral steroids due to her severe glaucoma.

## 2022-04-08 NOTE — Assessment & Plan Note (Signed)
Past history - Issues with her breathing for 20+ years.  Currently on Advair 250 mcg 1 puff twice a day x1 year and albuterol as needed with good benefit.  Cathy Baxter in the past.  Using albuterol less than once per week.  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  Takes Nexium as needed for reflux.  Seen cardiology as well. Interim history - stable.  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  Daily controller medication(s):  Take Advair 254mcg 1 puff twice a day and rinse mouth after each use.  May use albuterol rescue inhaler 2 puffs every 4 to 6 hours as needed for shortness of breath, chest tightness, coughing, and wheezing. May use albuterol rescue inhaler 2 puffs 5 to 15 minutes prior to strenuous physical activities. Monitor frequency of use.  Get spirometry at next visit. Consider adding on biologics next - it may help with her allergies as well.

## 2022-04-08 NOTE — Assessment & Plan Note (Signed)
Continue lifestyle and dietary modifications. Continue famotidine (Pepcid) 20mg  1-2 times per day.

## 2022-04-19 ENCOUNTER — Telehealth: Payer: Self-pay | Admitting: Pharmacist

## 2022-04-19 ENCOUNTER — Ambulatory Visit: Payer: Commercial Managed Care - HMO

## 2022-04-19 NOTE — Telephone Encounter (Signed)
Call to cancel today's appointment. Saw patient in Jan - [place] for Repatha was denied by insurance insetting of intolerance to first line and second line therapy( risk factor -HDL, obesity ASCVD risk 3.8%). Resubmitted the PA for Repatha this morning - no clinical discussion is anticipated for today's appointment hence call to cancel the appointment. N/A LVM

## 2022-04-19 NOTE — Progress Notes (Deleted)
Office Visit    Patient Name: Cathy Baxter Date of Encounter: 04/19/2022  Primary Care Provider:  Natalia Leatherwood, DO Primary Cardiologist:  Thomasene Ripple, DO  Chief Complaint    Hyperlipidemia   Significant Past Medical History                     Allergies  Allergen Reactions   Azithromycin Palpitations    Arrhythmia side effects   Oxycodone Shortness Of Breath   Penicillins Anaphylaxis and Itching   Percocet [Oxycodone-Acetaminophen] Shortness Of Breath   Amoxicillin Hives    Has patient had a PCN reaction causing immediate rash, facial/tongue/throat swelling, SOB or lightheadedness with hypotension: Yes Has patient had a PCN reaction causing severe rash involving mucus membranes or skin necrosis: No Has patient had a PCN reaction that required hospitalization No Has patient had a PCN reaction occurring within the last 10 years: Yes  If all of the above answers are "NO", then may proceed with Cephalosporin use.    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    Demerol [Meperidine] Palpitations    History of Present Illness    Cathy Baxter is a 62 y.o. female patient of Dr ***,  Community education officer [place]:   LDL Cholesterol goal:    Current Medications:     Previously tried:    Family Hx:     Social Hx: Tobacco: Alcohol:      Diet:      Exercise:   Adherence Assessment  Do you ever forget to take your medication? [] Yes [] No  Do you ever skip doses due to side effects? [] Yes [] No  Do you have trouble affording your medicines? [] Yes [] No  Are you ever unable to pick up your medication due to transportation difficulties? [] Yes [] No  Do you ever stop taking your medications because you don't believe they are helping? [] Yes [] No  Do you check your weight daily? [] Yes [] No   Adherence strategy: ***  Barriers to obtaining medications: ***     Accessory Clinical Findings   Lab Results  Component Value Date   CHOL 256 (H)  12/14/2021   HDL 68 12/14/2021   LDLCALC 168 (H) 12/14/2021   TRIG 116 12/14/2021   CHOLHDL 3.8 12/14/2021    Lab Results  Component Value Date   ALT 37 09/07/2020   AST 26 09/07/2020   ALKPHOS 57 09/07/2020   BILITOT 0.4 09/07/2020   Lab Results  Component Value Date   CREATININE 0.79 12/14/2021   BUN 11 12/14/2021   NA 142 12/14/2021   K 4.6 12/14/2021   CL 104 12/14/2021   CO2 21 12/14/2021   Lab Results  Component Value Date   HGBA1C 5.4 12/14/2021    Home Medications    Current Outpatient Medications  Medication Sig Dispense Refill   albuterol (VENTOLIN HFA) 108 (90 Base) MCG/ACT inhaler Inhale 2 puffs into the lungs every 4 (four) hours as needed for wheezing or shortness of breath (coughing fits). 18 g 1   brimonidine (ALPHAGAN) 0.2 % ophthalmic solution Place 1 drop into both eyes 2 (two) times daily.     cefdinir (OMNICEF) 300 MG capsule Take 1 capsule (300 mg total) by mouth 2 (two) times daily. 14 capsule 0   Cholecalciferol (VITAMIN D3) 75 MCG (3000 UT) TABS Take 3,000 Units by mouth daily.     diclofenac (VOLTAREN) 75 MG EC tablet 1 po bid with food (Patient not taking: Reported on 04/08/2022)  dorzolamide-timolol (COSOPT) 22.3-6.8 MG/ML ophthalmic solution Place 1 drop into both eyes 2 (two) times daily.      famotidine (PEPCID) 20 MG tablet Take 1 tablet (20 mg total) by mouth 2 (two) times daily. 60 tablet 5   fluticasone-salmeterol (ADVAIR) 250-50 MCG/ACT AEPB Inhale 1 puff into the lungs in the morning and at bedtime. Rinse mouth after each use. 60 each 5   furosemide (LASIX) 40 MG tablet Take 1 tablet (40 mg total) by mouth daily for 5 days. 5 tablet 0   levocetirizine (XYZAL) 5 MG tablet Take 0.5 tablets (2.5 mg total) by mouth every evening. 30 tablet 5   levothyroxine (SYNTHROID) 25 MCG tablet Take 1 tablet (25 mcg total) by mouth daily before breakfast. 90 tablet 3   Pitavastatin Calcium 4 MG TABS Take 1 tablet (4 mg total) by mouth daily. 30 tablet  11   potassium chloride SA (KLOR-CON M20) 20 MEQ tablet Take 1 tablet (20 mEq total) by mouth daily for 5 days. 5 tablet 0   vitamin B-12 (CYANOCOBALAMIN) 1000 MCG tablet Take 1,000 mcg by mouth daily.     No current facility-administered medications for this visit.     Assessment & Plan    No problem-specific Assessment & Plan notes found for this encounter.   Phillips Hay, PharmD CPP [facility] [address]  [place], [place] [address] [phone]  04/19/2022, 8:24 AM

## 2022-04-20 ENCOUNTER — Telehealth: Payer: Self-pay | Admitting: Pharmacist

## 2022-04-20 NOTE — Telephone Encounter (Signed)
PA for Repatha denied as patient does not have CAD or CAC score >100.  Patient is willing to try pitavastatin 4 mg again as she did not feel that bad on this statin compared to the other statin trial in the past.

## 2022-05-12 ENCOUNTER — Encounter: Payer: Self-pay | Admitting: Family Medicine

## 2022-05-12 ENCOUNTER — Ambulatory Visit (INDEPENDENT_AMBULATORY_CARE_PROVIDER_SITE_OTHER): Payer: Commercial Managed Care - HMO | Admitting: Family Medicine

## 2022-05-12 VITALS — BP 96/67 | HR 67 | Temp 98.0°F | Wt 236.4 lb

## 2022-05-12 DIAGNOSIS — E038 Other specified hypothyroidism: Secondary | ICD-10-CM

## 2022-05-12 DIAGNOSIS — E0789 Other specified disorders of thyroid: Secondary | ICD-10-CM

## 2022-05-12 DIAGNOSIS — E063 Autoimmune thyroiditis: Secondary | ICD-10-CM

## 2022-05-12 DIAGNOSIS — E039 Hypothyroidism, unspecified: Secondary | ICD-10-CM

## 2022-05-12 DIAGNOSIS — Z6841 Body Mass Index (BMI) 40.0 and over, adult: Secondary | ICD-10-CM | POA: Diagnosis not present

## 2022-05-12 DIAGNOSIS — E669 Obesity, unspecified: Secondary | ICD-10-CM | POA: Diagnosis not present

## 2022-05-12 NOTE — Progress Notes (Signed)
Cathy Baxter , 06-Oct-1960, 62 y.o., female MRN: [id number] Patient Care Team    Relationship Specialty Notifications Start End  Natalia Leatherwood, DO PCP - General Family Medicine  08/30/16   Thomasene Ripple, DO PCP - Cardiology Cardiology  11/26/20   Carlus Pavlov, MD Consulting Physician Internal Medicine  02/20/16    Comment: endocrine  Mitchel Honour, DO Consulting Physician Obstetrics and Gynecology  02/20/16   Kathryne Hitch, MD Consulting Physician Orthopedic Surgery  07/11/18   Toni Arthurs, MD Consulting Physician Orthopedic Surgery  02/27/20   Durene Romans, MD Consulting Physician Orthopedic Surgery  02/27/20     Chief Complaint  Patient presents with   Hypothyroidism     Subjective: Pt presents for an OV to follow up on  [facility] Hypothyroidism, unspecified type/obesity Pt reports compliance of levothyroxine 25 mcg.  Thyroid levels last collected a little over a year ago. Pt reports she has noticed discomfort around her thyroid. She reports it will feel "bruised" in this location at times. Sensation will last about 5-7 days when it occurs. Has occurred about 4x over last 6 months. She does not note feeling any masses. She denies fever or chills.       05/12/2022    1:44 PM 04/15/2021    3:03 PM 12/01/2020    2:52 PM 12/12/2019   10:13 AM 07/25/2018    1:41 PM  Depression screen PHQ 2/9  Decreased Interest 1 1 0 3 0  Down, Depressed, Hopeless 1 1 0 3 0  PHQ - 2 Score 2 2 0 6 0  Altered sleeping 2 1  3    Tired, decreased energy 3 3  3    Change in appetite 2 0  3   Feeling bad or failure about yourself  1 1  3    Trouble concentrating 1 2  3    Moving slowly or fidgety/restless 0 0  0   Suicidal thoughts 0 0  0   PHQ-9 Score 11 9  21      Allergies  Allergen Reactions   Azithromycin Palpitations    Arrhythmia side effects   Oxycodone Shortness Of Breath   Penicillins Anaphylaxis and Itching   Percocet [Oxycodone-Acetaminophen] Shortness Of Breath   Amoxicillin  Hives    Has patient had a PCN reaction causing immediate rash, facial/tongue/throat swelling, SOB or lightheadedness with hypotension: Yes Has patient had a PCN reaction causing severe rash involving mucus membranes or skin necrosis: No Has patient had a PCN reaction that required hospitalization No Has patient had a PCN reaction occurring within the last 10 years: Yes  If all of the above answers are "NO", then may proceed with Cephalosporin use.    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    Demerol [Meperidine] Palpitations   Social History   Social History Narrative   Divorced. She has had 3 children, one passed away young from cancer.    11th grade education. Works at [employer].    Former smoker.   Drinks caffeine.    Wears seatbelt, smoke detector in the home.    Firearms in the home.    Feels safe in her relationships.    Past Medical History:  Diagnosis Date   Allergy    Anemia    Arthritis    Asthma    Chicken pox    Depression    Fatty liver    GERD (gastroesophageal reflux disease)    Hiatal hernia  Hyperlipidemia    Pigmentary glaucoma of both eyes    Polyp of stomach    Shingles    Thyroid disease    Urinary incontinence    Uterine fibroid    Uterine fibroids in pregnancy, postpartum condition 11/06/2015   Past Surgical History:  Procedure Laterality Date   CHOLECYSTECTOMY  2002   EYE SURGERY     HYSTEROSCOPY WITH D & C N/A 11/13/2020   Procedure: DILATATION AND CURETTAGE /HYSTEROSCOPY;  Surgeon: Mitchel Honour, DO;  Location: [facility] OR;  Service: Gynecology;  Laterality: N/A;  rep Tobi Bastos to cover case confirmed on 11/10/20 CS   KNEE ARTHROSCOPY Left 08/2015   NASAL SINUS SURGERY  2000   Family History  Problem Relation Age of Onset   Heart disease Mother    Asthma Mother    COPD Mother    Early death Father    Cancer Son 91       Neuroepithelioma    Breast cancer Sister 17   Celiac disease Daughter    Allergic rhinitis Neg Hx     Eczema Neg Hx    Urticaria Neg Hx    Angioedema Neg Hx    Immunodeficiency Neg Hx    Atopy Neg Hx    Allergies as of 05/12/2022       Reactions   Azithromycin Palpitations   Arrhythmia side effects   Oxycodone Shortness Of Breath   Penicillins Anaphylaxis, Itching   Percocet [oxycodone-acetaminophen] Shortness Of Breath   Amoxicillin Hives   Has patient had a PCN reaction causing immediate rash, facial/tongue/throat swelling, SOB or lightheadedness with hypotension: Yes Has patient had a PCN reaction causing severe rash involving mucus membranes or skin necrosis: No Has patient had a PCN reaction that required hospitalization No Has patient had a PCN reaction occurring within the last 10 years: Yes  If all of the above answers are "NO", then may proceed with Cephalosporin use.   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    Demerol [meperidine] Palpitations        Medication List        Accurate as of May 12, 2022  1:54 PM. If you have any questions, ask your nurse or doctor.          STOP taking these medications    cefdinir 300 MG capsule Commonly known as: OMNICEF Stopped by: Felix Pacini, DO   diclofenac 75 MG EC tablet Commonly known as: VOLTAREN Stopped by: Felix Pacini, DO   furosemide 40 MG tablet Commonly known as: LASIX Stopped by: Felix Pacini, DO   Pitavastatin Calcium 4 MG Tabs Stopped by: Felix Pacini, DO   potassium chloride SA 20 MEQ tablet Commonly known as: Klor-Con M20 Stopped by: Felix Pacini, DO       TAKE these medications    albuterol 108 (90 Base) MCG/ACT inhaler Commonly known as: Ventolin HFA Inhale 2 puffs into the lungs every 4 (four) hours as needed for wheezing or shortness of breath (coughing fits).   brimonidine 0.2 % ophthalmic solution Commonly known as: ALPHAGAN Place 1 drop into both eyes 2 (two) times daily.   cyanocobalamin 1000 MCG tablet Commonly known as: VITAMIN B12 Take 1,000 mcg  by mouth daily.   dorzolamide-timolol 2-0.5 % ophthalmic solution Commonly known as: COSOPT Place 1 drop into both eyes 2 (two) times daily.   famotidine 20 MG tablet Commonly known as: PEPCID Take 1 tablet (20 mg total) by mouth 2 (two) times daily.  fluticasone-salmeterol 250-50 MCG/ACT Aepb Commonly known as: ADVAIR Inhale 1 puff into the lungs in the morning and at bedtime. Rinse mouth after each use.   levocetirizine 5 MG tablet Commonly known as: XYZAL Take 0.5 tablets (2.5 mg total) by mouth every evening.   levothyroxine 25 MCG tablet Commonly known as: SYNTHROID Take 1 tablet (25 mcg total) by mouth daily before breakfast.   Vitamin D3 75 MCG (3000 UT) Tabs Take 3,000 Units by mouth daily.        All past medical history, surgical history, allergies, family history, immunizations andmedications were updated in the EMR today and reviewed under the history and medication portions of their EMR.    Review of Systems  Constitutional:  Negative for chills, fever and malaise/fatigue.  Gastrointestinal:  Negative for constipation and diarrhea.  Neurological:  Negative for weakness.  All other systems reviewed and are negative.     Objective:  BP 96/67   Pulse 67   Temp 98 F (36.7 C)   Wt 236 lb 6.4 oz (107.2 kg)   SpO2 96%   BMI 41.88 kg/m  Body mass index is 41.88 kg/m. Physical Exam Vitals and nursing note reviewed.  Constitutional:      General: She is not in acute distress.    Appearance: Normal appearance. She is not ill-appearing, toxic-appearing or diaphoretic.  HENT:     Head: Normocephalic and atraumatic.  Eyes:     General: No scleral icterus.       Right eye: No discharge.        Left eye: No discharge.     Extraocular Movements: Extraocular movements intact.     Conjunctiva/sclera: Conjunctivae normal.     Pupils: Pupils are equal, round, and reactive to light.  Neck:     Comments: No thyromegaly Cardiovascular:     Rate and Rhythm:  Normal rate and regular rhythm.     Heart sounds: No murmur heard. Pulmonary:     Effort: Pulmonary effort is normal. No respiratory distress.     Breath sounds: Normal breath sounds.  Musculoskeletal:     Cervical back: Neck supple. No tenderness.     Right lower leg: No edema.     Left lower leg: No edema.  Lymphadenopathy:     Cervical: No cervical adenopathy.  Skin:    General: Skin is warm.     Findings: No rash.  Neurological:     Mental Status: She is alert and oriented to person, place, and time. Mental status is at baseline.     Motor: No weakness.     Gait: Gait normal.  Psychiatric:        Mood and Affect: Mood normal.        Behavior: Behavior normal.        Thought Content: Thought content normal.        Judgment: Judgment normal.     No results found. No results found. No results found for this or any previous visit (from the past 24 hour(s)).   Assessment/Plan: Cathy Baxter is a 62 y.o. female present for OV for  Hypothyroidism/obesity/ thyroid pain Due for labs today: TSH and T4 free collected Continue  levothyroxine 25 mcg a day refills will be provided in appropriate dose based on lab result today Patient aware medication needs to be taken on an empty stomach Thyroid pain: [place] ordered  Reviewed expectations re: course of current medical issues. Discussed self-management of symptoms. Outlined signs and symptoms indicating need for more  acute intervention. Patient verbalized understanding and all questions were answered. Patient received an After-Visit Summary.    Orders Placed This Encounter  Procedures   US THYROID   T4, free   TSH   No orders of the defined types were placed in this encounter.  Referral Orders  No referral(s) requested today     Note is dictated utilizing voice recognition software. Although note has been proof read prior to signing, occasional typographical errors still can be missed. If any questions arise, please do  not hesitate to call for verification.   electronically signed by:  Felix Pacini, DO  [facility] - OR

## 2022-05-12 NOTE — Patient Instructions (Addendum)
Return in about 1 year (around 05/13/2023) for cpe (20 min), Routine chronic condition follow-up.        Great to see you today.  I have refilled the medication(s) we provide.   If labs were collected, we will inform you of lab results once received either by echart message or telephone call.   - echart message- for normal results that have been seen by the patient already.   - telephone call: abnormal results or if patient has not viewed results in their echart.

## 2022-05-13 ENCOUNTER — Other Ambulatory Visit: Payer: Self-pay | Admitting: Family Medicine

## 2022-05-13 LAB — TSH: TSH: 3.1 mIU/L (ref 0.40–4.50)

## 2022-05-13 LAB — T4, FREE: Free T4: 1.1 ng/dL (ref 0.8–1.8)

## 2022-05-13 MED ORDER — LEVOTHYROXINE SODIUM 25 MCG PO TABS: 25.0000 ug | ORAL_TABLET | Freq: Every day | ORAL | 3 refills | Status: DC

## 2022-05-17 ENCOUNTER — Ambulatory Visit (HOSPITAL_BASED_OUTPATIENT_CLINIC_OR_DEPARTMENT_OTHER)
Admission: RE | Admit: 2022-05-17 | Discharge: 2022-05-17 | Disposition: A | Discharge status: Home / Self Care | Payer: Commercial Managed Care - HMO | Source: Ambulatory Visit | Attending: Family Medicine | Admitting: Family Medicine

## 2022-05-17 DIAGNOSIS — E038 Other specified hypothyroidism: Secondary | ICD-10-CM | POA: Insufficient documentation

## 2022-05-17 DIAGNOSIS — E0789 Other specified disorders of thyroid: Secondary | ICD-10-CM | POA: Insufficient documentation

## 2022-05-17 DIAGNOSIS — E063 Autoimmune thyroiditis: Secondary | ICD-10-CM | POA: Diagnosis present

## 2022-05-27 ENCOUNTER — Other Ambulatory Visit: Payer: Self-pay | Admitting: Podiatry

## 2022-05-27 ENCOUNTER — Ambulatory Visit (INDEPENDENT_AMBULATORY_CARE_PROVIDER_SITE_OTHER): Payer: Commercial Managed Care - HMO

## 2022-05-27 ENCOUNTER — Ambulatory Visit (INDEPENDENT_AMBULATORY_CARE_PROVIDER_SITE_OTHER): Payer: Commercial Managed Care - HMO | Admitting: Podiatry

## 2022-05-27 DIAGNOSIS — M76829 Posterior tibial tendinitis, unspecified leg: Secondary | ICD-10-CM

## 2022-05-27 DIAGNOSIS — R609 Edema, unspecified: Secondary | ICD-10-CM

## 2022-05-27 DIAGNOSIS — M76821 Posterior tibial tendinitis, right leg: Secondary | ICD-10-CM | POA: Diagnosis not present

## 2022-05-27 DIAGNOSIS — M79671 Pain in right foot: Secondary | ICD-10-CM

## 2022-05-27 MED ORDER — MELOXICAM 7.5 MG PO TABS: 7.5000 mg | ORAL_TABLET | Freq: Every day | ORAL | 0 refills | Status: DC | PRN

## 2022-05-27 MED ORDER — GABAPENTIN 100 MG PO CAPS: 100.0000 mg | ORAL_CAPSULE | Freq: Every day | ORAL | 0 refills | Status: DC

## 2022-05-27 NOTE — Patient Instructions (Signed)
For instructions on how to put on your Tri-Lock Ankle Brace, please visit [email or web address] 

## 2022-05-27 NOTE — Progress Notes (Signed)
Subjective:   Patient ID: Cathy Baxter, female   DOB: 62 y.o.   MRN: [id number]   HPI Chief Complaint  Patient presents with   Foot Pain    Rm 14  Right foot pain x 2 years. Pain located in the medial side of her foot. Pt also complains of a nodule that throbs and has numbness in her toes.    62 year old female presents the office for above concerns.  Most of her symptoms on the medial aspect of the foot for last couple of years.  No recent injuries.  No radiating pain.  No recent treatment.  No other concerns.   Review of Systems  All other systems reviewed and are negative.  Past Medical History:  Diagnosis Date   Allergy    Anemia    Arthritis    Asthma    Chicken pox    Depression    Fatty liver    GERD (gastroesophageal reflux disease)    Hiatal hernia    Hyperlipidemia    Pigmentary glaucoma of both eyes    Polyp of stomach    Shingles    Thyroid disease    Urinary incontinence    Uterine fibroid    Uterine fibroids in pregnancy, postpartum condition 11/06/2015    Past Surgical History:  Procedure Laterality Date   CHOLECYSTECTOMY  2002   EYE SURGERY     HYSTEROSCOPY WITH D & C N/A 11/13/2020   Procedure: DILATATION AND CURETTAGE /HYSTEROSCOPY;  Surgeon: Mitchel Honour, DO;  Location: [facility] OR;  Service: Gynecology;  Laterality: N/A;  rep Tobi Bastos to cover case confirmed on 11/10/20 CS   KNEE ARTHROSCOPY Left 08/2015   NASAL SINUS SURGERY  2000     Current Outpatient Medications:    gabapentin (NEURONTIN) 100 MG capsule, Take 1 capsule (100 mg total) by mouth at bedtime., Disp: 90 capsule, Rfl: 0   meloxicam (MOBIC) 7.5 MG tablet, Take 1 tablet (7.5 mg total) by mouth daily as needed for pain., Disp: 14 tablet, Rfl: 0   albuterol (VENTOLIN HFA) 108 (90 Base) MCG/ACT inhaler, Inhale 2 puffs into the lungs every 4 (four) hours as needed for wheezing or shortness of breath (coughing fits)., Disp: 18 g, Rfl: 1   brimonidine (ALPHAGAN) 0.2 % ophthalmic solution, Place 1  drop into both eyes 2 (two) times daily., Disp: , Rfl:    Cholecalciferol (VITAMIN D3) 75 MCG (3000 UT) TABS, Take 3,000 Units by mouth daily., Disp: , Rfl:    dorzolamide-timolol (COSOPT) 22.3-6.8 MG/ML ophthalmic solution, Place 1 drop into both eyes 2 (two) times daily. , Disp: , Rfl:    famotidine (PEPCID) 20 MG tablet, Take 1 tablet (20 mg total) by mouth 2 (two) times daily., Disp: 60 tablet, Rfl: 5   fluticasone-salmeterol (ADVAIR) 250-50 MCG/ACT AEPB, Inhale 1 puff into the lungs in the morning and at bedtime. Rinse mouth after each use., Disp: 60 each, Rfl: 5   levocetirizine (XYZAL) 5 MG tablet, Take 0.5 tablets (2.5 mg total) by mouth every evening., Disp: 30 tablet, Rfl: 5   levothyroxine (SYNTHROID) 25 MCG tablet, Take 1 tablet (25 mcg total) by mouth daily before breakfast., Disp: 90 tablet, Rfl: 3   vitamin B-12 (CYANOCOBALAMIN) 1000 MCG tablet, Take 1,000 mcg by mouth daily., Disp: , Rfl:   Allergies  Allergen Reactions   Azithromycin Palpitations    Arrhythmia side effects   Oxycodone Shortness Of Breath   Penicillins Anaphylaxis and Itching   Percocet [Oxycodone-Acetaminophen] Shortness Of Breath  Amoxicillin Hives    Has patient had a PCN reaction causing immediate rash, facial/tongue/throat swelling, SOB or lightheadedness with hypotension: Yes Has patient had a PCN reaction causing severe rash involving mucus membranes or skin necrosis: No Has patient had a PCN reaction that required hospitalization No Has patient had a PCN reaction occurring within the last 10 years: Yes  If all of the above answers are "NO", then may proceed with Cephalosporin use.    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    Demerol [Meperidine] Palpitations          Objective:  Physical Exam  General: AAO x3, NAD  Dermatological: Skin is warm, dry and supple bilateral. There are no open sores, no preulcerative lesions, no rash or signs of infection  present.  Vascular: Dorsalis Pedis artery and Posterior Tibial artery pedal pulses are 2/4 bilateral with immedate capillary fill time. There is no pain with calf compression, swelling, warmth, erythema.   Neruologic: Grossly intact via light touch bilateral.  Negative Tinel sign.  Musculoskeletal: Decreased arch upon weightbearing.  Prominence along the medial aspect of the foot just distal to the navicular tuberosity and along the navicular tuberosity.  Mild tenderness palpation of this area on the distal portion of posterior tibial, flexor tendons.  Clinically the tendons.  Be intact.  MMT 5/5.  Slight localized edema. No pain of the toes.  Gait: Unassisted, Nonantalgic.       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  3 views of the foot were obtained.  There is no evidence of acute fracture.  Decreased calcaneal inclination angle.  Calcaneal spurring present.  On lateral view some spurring present of the midfoot.  Arthritic changes present along the Lisfranc joint noted on the lateral view. -Tri-Lock ankle brace dispensed to help immobilize and support the soft tissue to facilitate healing. -Prescribed mobic. Discussed side effects of the medication and directed to stop if any are to occur and call the office.  -Discussed shoes, good arch support.  Return in about 6 weeks (around 07/08/2022).  Vivi Barrack DPM

## 2022-06-02 DIAGNOSIS — R490 Dysphonia: Secondary | ICD-10-CM | POA: Insufficient documentation

## 2022-06-02 DIAGNOSIS — J385 Laryngeal spasm: Secondary | ICD-10-CM | POA: Insufficient documentation

## 2022-06-03 ENCOUNTER — Ambulatory Visit (HOSPITAL_COMMUNITY)
Admission: RE | Admit: 2022-06-03 | Discharge: 2022-06-03 | Disposition: A | Discharge status: Home / Self Care | Payer: Commercial Managed Care - HMO | Source: Ambulatory Visit | Attending: Podiatry | Admitting: Podiatry

## 2022-06-03 DIAGNOSIS — R609 Edema, unspecified: Secondary | ICD-10-CM | POA: Diagnosis not present

## 2022-06-04 ENCOUNTER — Other Ambulatory Visit: Payer: Self-pay | Admitting: Podiatry

## 2022-06-04 DIAGNOSIS — I872 Venous insufficiency (chronic) (peripheral): Secondary | ICD-10-CM

## 2022-06-14 ENCOUNTER — Other Ambulatory Visit: Payer: Self-pay

## 2022-06-14 ENCOUNTER — Ambulatory Visit (INDEPENDENT_AMBULATORY_CARE_PROVIDER_SITE_OTHER): Payer: Commercial Managed Care - HMO | Admitting: Orthopaedic Surgery

## 2022-06-14 DIAGNOSIS — M23203 Derangement of unspecified medial meniscus due to old tear or injury, right knee: Secondary | ICD-10-CM

## 2022-06-14 DIAGNOSIS — S83241D Other tear of medial meniscus, current injury, right knee, subsequent encounter: Secondary | ICD-10-CM

## 2022-06-14 NOTE — Progress Notes (Signed)
The patient is a 62 year old female that we were going to perform knee arthroscopy on her right knee about a year or more ago because of some continued right knee pain and signal changes in the medial meniscus suggesting a tear.  She is someone who is morbidly obese.  Her insurance company denied her having that surgery and now she comes in stating that the knee gives out on her quite a bit and is starting to bother her worse.  On exam both her knees hyperextend.  She does have a large soft tissue envelope around both knees and there is slight valgus malalignment.  Her previous x-rays remotely showed no significant arthritic changes.  She does have a positive Murray sign to both the medial and lateral aspect of her right knee.  There is patellofemoral collation as well.  Both knees hyperextend.  At this point I would like to obtain a MRI of her right knee to truly assess the cartilage in the meniscus at this point given her continued symptoms that are worsening with mechanical symptoms.  She did let me know she is seeing a vein specialist and there is some valve issues that may be creating swelling and pain with her leg as well.  I do feel the MRI is appropriate and we will see her back once we have the MRI of the right knee.  At her next visit I would like a weight and BMI calculation.

## 2022-06-18 ENCOUNTER — Encounter: Payer: Self-pay | Admitting: Orthopaedic Surgery

## 2022-06-27 ENCOUNTER — Ambulatory Visit
Admission: RE | Admit: 2022-06-27 | Discharge: 2022-06-27 | Disposition: A | Discharge status: Home / Self Care | Payer: Commercial Managed Care - HMO | Source: Ambulatory Visit | Attending: Orthopaedic Surgery | Admitting: Orthopaedic Surgery

## 2022-06-27 DIAGNOSIS — S83241D Other tear of medial meniscus, current injury, right knee, subsequent encounter: Secondary | ICD-10-CM

## 2022-06-27 DIAGNOSIS — M23203 Derangement of unspecified medial meniscus due to old tear or injury, right knee: Secondary | ICD-10-CM

## 2022-07-08 ENCOUNTER — Ambulatory Visit (INDEPENDENT_AMBULATORY_CARE_PROVIDER_SITE_OTHER): Payer: Commercial Managed Care - HMO | Admitting: Podiatry

## 2022-07-08 DIAGNOSIS — I872 Venous insufficiency (chronic) (peripheral): Secondary | ICD-10-CM | POA: Diagnosis not present

## 2022-07-08 DIAGNOSIS — M76829 Posterior tibial tendinitis, unspecified leg: Secondary | ICD-10-CM

## 2022-07-08 DIAGNOSIS — R609 Edema, unspecified: Secondary | ICD-10-CM

## 2022-07-08 MED ORDER — MELOXICAM 7.5 MG PO TABS: 7.5000 mg | ORAL_TABLET | Freq: Every day | ORAL | 0 refills | Status: DC | PRN

## 2022-07-08 NOTE — Progress Notes (Signed)
Subjective:   Patient ID: Cathy Baxter, female   DOB: 62 y.o.   MRN: [id number]   HPI Chief Complaint  Patient presents with   Follow-up    Posterior tibial tendon dysfunction. Patient has pain occasionally. Patient completed course of Mobic. She is not wearing the Tri-Lok brace due to not knowing how to put it on. Patient had ultrasound done.     62 year old female presents the office for above concerns.  She did have ultrasound and was referred to the vein specialist however she has not heard from them yet.  Describing pain to her leg in general, which is not new. No recent injuries or changes otherwise.       Objective:  Physical Exam  General: AAO x3, NAD  Dermatological: Skin is warm, dry and supple bilateral. There are no open sores, no preulcerative lesions, no rash or signs of infection present.  Vascular: Dorsalis Pedis artery and Posterior Tibial artery pedal pulses are 2/4 bilateral with immedate capillary fill time. There is no pain with calf compression, swelling, warmth, erythema.  Calf is supple.  Neruologic: Grossly intact via light touch bilateral.  Negative Tinel sign.  Musculoskeletal: Decreased arch upon weightbearing.  There is tenderness along the medial aspect of the foot just distal to the navicular tuberosity and along the navicular tuberosity.  Mild tenderness palpation of this area on the distal portion of posterior tibial, flexor tendons.  Clinically the tendons are intact.  MMT 5/5.  No pain of the toes.  Gait: Unassisted, Nonantalgic.       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 Will follow up on the vein center referral. -Discussed shoes, good arch supports to help.  Discussed stretching, rehab exercises.  Discussed physical therapy as well.  MRI if no improvement.  -MRI of her knee- follow up with orthopedics   Return in about 6 weeks (around 08/19/2022).  Vivi Barrack DPM

## 2022-07-08 NOTE — Patient Instructions (Signed)
For instructions on how to put on your Tri-Lock Ankle Brace, please visit [email or web address]  --  Posterior Tibial Tendon Tear Rehab Ask your health care provider which exercises are safe for you. Do exercises exactly as told by your provider and adjust them as told. It is normal to feel mild stretching, pulling, tightness, or discomfort as you do these exercises. Stop right away if you feel sudden pain or your pain gets worse. Do not begin these exercises until told by your provider. Stretching and range-of-motion exercises These exercises warm up your muscles and joints and improve the movement and flexibility of your ankle. These exercises also help to relieve pain, numbness, and tingling. Gastrocnemius stretch  Sit on the floor with your left / right leg extended. Loop a belt or towel around ball of your left / right foot. The ball of your foot is on the walking surface, right under your toes. Keep your left / right ankle and foot relaxed and keep your knee straight while you use the belt or towel to pull your foot and ankle toward you. You should feel a gentle stretch behind your calf or knee (gastrocnemius). Hold this position for __________ seconds. Repeat __________ times. Complete this exercise __________ times a day. Active ankle dorsiflexion and plantar flexion  Sit with your left / right knee straight or bent. Do not rest your foot on anything. Flex your left / right ankle to tilt the top of your foot toward your shin (dorsiflexion). Hold this position for __________ seconds. Point your toes downward to tilt the top of your foot away from your shin (plantar flexion). Hold this position for __________ seconds. Repeat __________ times with your knee straight and __________ times with your knee bent. Complete this exercise __________ times a day. Passive ankle plantar flexion  Sit with your left / right leg crossed over your opposite knee. With your left / right hand, pull  the front of your foot and toes toward you (plantar flexion). You should feel a gentle stretch on the top of your foot and ankle. Hold this position for __________ seconds. Repeat __________ times. Complete this exercise __________ times a day. Passive ankle eversion  Sit with your left / right ankle crossed over your opposite knee. With your left / right hand, hold your foot so that your thumb is on the top of your foot and your fingers are on the bottom of your foot. Gently push and twist your ankle downward (eversion). You should feel a gentle stretch on the inside of your ankle. Hold this stretch for __________ seconds. Repeat __________ times. Complete this exercise __________ times a day. Passive ankle inversion  Sit with your left / right ankle crossed over your opposite knee. With your left / right hand, hold your foot so that your thumb is on the bottom of your foot and your fingers are across the top of your foot. Gently pull and twist your ankle upward (inversion). You should feel a gentle stretch on the outside of your ankle. Hold the stretch for __________ seconds. Repeat __________ times. Complete this exercise __________ times a day. Ankle alphabet  Sit with your left / right leg supported at the lower leg. Do not rest your foot on anything. Make sure your foot has room to move freely. Think of your left / right foot as a paintbrush, and move your foot to trace each letter of the alphabet in the air. Keep your hip and knee still while you trace. Trace  every letter of the alphabet. Repeat __________ times. Complete this exercise __________ times a day. Strengthening exercises These exercises build strength and endurance in your lower leg. Endurance is the ability to use your muscles for a long time, even after they get tired. Dorsiflexion  Secure a rubber exercise band or tube to an object that will not move if it is pulled on, such as a table leg. Secure the other end of  the band around your left / right foot. Sit on the floor, facing the object with your left / right leg extended. The band or tube should be slightly tense when your foot is relaxed. Slowly flex your left / right ankle and toes to bring your foot toward you (dorsiflexion). Hold this position for __________ seconds. Let the band or tube slowly pull your foot back to the starting position. Repeat __________ times. Complete this exercise __________ times a day. Plantar flexion while seated  Sit on the floor with your left / right leg extended. Loop a rubber exercise band or tube around the ball of your left / right foot. The ball of your foot is on the walking surface, right under your toes. The band or tube should be slightly tense when your foot is relaxed. Slowly point your toes downward, pushing them away from you (plantar flexion). Hold this position for __________ seconds. Let the band or tube slowly pull your foot back to the starting position. Repeat __________ times. Complete this exercise __________ times a day. Towel curls  Sit in a chair on a non-carpeted surface, and put your feet on the floor. Place a towel in front of your feet. If told by your provider, add __________ to the end of the towel. Keeping your heel on the floor, put your left / right foot on the towel. Pull the towel toward you by grabbing the towel with your toes and curling them under. Keep your heel on the floor. Repeat __________ times. Complete this exercise __________ times a day. This information is not intended to replace advice given to you by your health care provider. Make sure you discuss any questions you have with your health care provider. Document Revised: 01/14/2022 Document Reviewed: 01/14/2022 Elsevier Patient Education  2024 [employer].

## 2022-08-02 ENCOUNTER — Ambulatory Visit (INDEPENDENT_AMBULATORY_CARE_PROVIDER_SITE_OTHER): Payer: Commercial Managed Care - HMO | Admitting: Physician Assistant

## 2022-08-02 ENCOUNTER — Encounter: Payer: Self-pay | Admitting: Physician Assistant

## 2022-08-02 VITALS — Ht 63.0 in | Wt 234.0 lb

## 2022-08-02 DIAGNOSIS — S83241D Other tear of medial meniscus, current injury, right knee, subsequent encounter: Secondary | ICD-10-CM | POA: Diagnosis not present

## 2022-08-02 NOTE — Progress Notes (Signed)
HPI: Cathy Baxter continues to have right knee pain and giving way.  She also states a catching sensation in the knee.  She has been taking Mobic states this helps some.  She underwent an MRI of the knee and returns today for follow-up.  MRI of the right knee compared to prior MRI of the right knee done just a few years ago shows a radial tear of the posterior horn of the medial meniscus towards the root with peripheral meniscal extrusion.  Body of the meniscus now has severe degenerative changes where it had just signal changes.  Arthritic changes remaining changed she has high-grade partial-thickness cartilage wear and areas of full-thickness cartilage wear involving the medial femoral-tibial compartment.  She has mild arthritic changes patella femoral joint and the lateral compartment.  Physical exam: General: Well-developed well-nourished female no acute distress. Right knee: Good range of motion.  Varus malalignment.  Tenderness medial joint line.  Impression: Right knee medial meniscal tear  Plan: Given the fact that she has failed conservative treatment which is included time, NSAIDs, steroid injections and viscosupplementation injections recommend right knee arthroscopy with debridement and partial medial meniscectomy.  Risk benefits of surgery discussed risk of blood clot and infection discussed.  Postoperative protocol discussed.  Questions encouraged and answered.  Will see her back 1 week postop.

## 2022-08-17 ENCOUNTER — Telehealth: Payer: Self-pay | Admitting: Podiatry

## 2022-08-17 NOTE — Telephone Encounter (Signed)
Pt stated that she never received a call from the vein specialist. I gave her the number to call & they stated that they never received a referral. Referral will need to be sent to [phone]. Please advise

## 2022-08-19 ENCOUNTER — Ambulatory Visit: Payer: Commercial Managed Care - HMO | Admitting: Podiatry

## 2022-08-30 ENCOUNTER — Ambulatory Visit (INDEPENDENT_AMBULATORY_CARE_PROVIDER_SITE_OTHER): Payer: Commercial Managed Care - HMO | Admitting: Podiatry

## 2022-08-30 DIAGNOSIS — I872 Venous insufficiency (chronic) (peripheral): Secondary | ICD-10-CM | POA: Diagnosis not present

## 2022-08-30 DIAGNOSIS — M76829 Posterior tibial tendinitis, unspecified leg: Secondary | ICD-10-CM | POA: Diagnosis not present

## 2022-08-30 NOTE — Progress Notes (Unsigned)
Subjective:   Patient ID: Cathy Baxter, female   DOB: 62 y.o.   MRN: [id number]   HPI Chief Complaint  Patient presents with   Foot Pain    PT stated that she is doing okay most of her pain is in her legs     62 year old female presents the office for above concerns.  She has follow-up with, vein specialist as well as orthopedics.  She still gets pain mostly to her legs.    She does report a fall and states she injured her "tail bone" but did not have any treatment. This was 3 weeks ago.        Objective:  Physical Exam  General: AAO x3, NAD  Dermatological: Skin is warm, dry and supple bilateral. There are no open sores, no preulcerative lesions, no rash or signs of infection present.  Vascular: Dorsalis Pedis artery and Posterior Tibial artery pedal pulses are 2/4 bilateral with immedate capillary fill time. There is no pain with calf compression, swelling, warmth, erythema.  Calf is supple.  Neruologic: Grossly intact via light touch bilateral.  Negative Tinel sign.  Musculoskeletal: Decreased arch upon weightbearing.  There is some tenderness along the medial aspect of the foot just distal to the navicular tuberosity and along the navicular tuberosity.  Mild tenderness palpation of this area on the distal portion of posterior tibial, flexor tendons.  Clinically the tendons are intact.  MMT 5/5.  No pain of the toes.  Gait: Unassisted, Nonantalgic.       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  She can continue to follow-up with the vein center recommend compression socks.  She will follow with orthopedics as well.  Will hold gabapentin. -Continue shoes, good arch support as well.  Certainly is imaging of the foot but will need to work on the vein issues  as well as the need to see if this helps foot.  Recommend follow-up with her PCP for her fall, continued pain.  Vivi Barrack DPM

## 2022-09-06 ENCOUNTER — Encounter (HOSPITAL_BASED_OUTPATIENT_CLINIC_OR_DEPARTMENT_OTHER): Payer: Self-pay | Admitting: Orthopaedic Surgery

## 2022-09-07 ENCOUNTER — Encounter (HOSPITAL_BASED_OUTPATIENT_CLINIC_OR_DEPARTMENT_OTHER): Payer: Self-pay | Admitting: Orthopaedic Surgery

## 2022-09-07 NOTE — Progress Notes (Addendum)
Spoke w/ via phone for pre-op interview--- pt Lab needs dos----  no             Lab results------ no COVID test -----patient states asymptomatic no test needed Arrive at ------- 0530 on 09-16-2022 NPO after MN Med rec completed Medications to take morning of surgery ----- synthroid, pepcid, eye drops as usual, advair inhaler Diabetic medication ----- n/a Patient instructed no nail polish to be worn day of surgery Patient instructed to bring photo id and insurance card day of surgery Patient aware to have Driver (ride ) / caregiver    for 24 hours after surgery -- friend, johnny Patient Special Instructions ----- asked to bring rescue inhaler dos Pt stated she is scheduled to have procedure done on her lower legs for veins day before surgery, she asked me if she should do this. Pt advised to call dr c. Magnus Ivan office prior to having the procedure ,possible if she were to do this dr Magnus Ivan would reschedule her surgery, pt verbalized understanding. Pre-Op special Instructions ----- called OR scheduler , Sherrie, left voice mail message requested pre-op orders Patient verbalized understanding of instructions that were given at this phone interview. Patient denies shortness of breath, chest pain, fever, cough at this phone interview.

## 2022-09-13 NOTE — Patient Instructions (Signed)
SURGICAL WAITING ROOM VISITATION Patients having surgery or a procedure may have no more than 2 support people in the waiting area - these visitors may rotate in the visitor waiting room.   Due to an increase in RSV and influenza rates and associated hospitalizations, children ages 17 and under may not visit patients in [facility] hospitals. If the patient needs to stay at the hospital during part of their recovery, the visitor guidelines for inpatient rooms apply.  PRE-OP VISITATION  Pre-op nurse will coordinate an appropriate time for 1 support person to accompany the patient in pre-op.  This support person may not rotate.  This visitor will be contacted when the time is appropriate for the visitor to come back in the pre-op area.  Please refer to the [facility] website for the visitor guidelines for Inpatients (after your surgery is over and you are in a regular room).  You are not required to quarantine at this time prior to your surgery. However, you must do this: Hand Hygiene often Do NOT share personal items Notify your provider if you are in close contact with someone who has COVID or you develop fever 100.4 or greater, new onset of sneezing, cough, sore throat, shortness of breath or body aches.  If you test positive for Covid or have been in contact with anyone that has tested positive in the last 10 days please notify you surgeon.    Your procedure is scheduled on:  Thursday  September 16, 2022  Report to [facility] Main Entrance: [place] entrance where the [employer] is available.   Report to admitting at:  05:15   AM  Call this number if you have any questions or problems the morning of surgery [phone]  Do not eat food after Midnight the night prior to your surgery/procedure.  After Midnight you may have the following liquids until   04:30 AM DAY OF SURGERY  Clear Liquid Diet Water Black Coffee (sugar ok, NO MILK/CREAM OR CREAMERS)  Tea (sugar ok, NO  MILK/CREAM OR CREAMERS) regular and decaf                             Plain Jell-O  with no fruit (NO RED)                                           Fruit ices (not with fruit pulp, NO RED)                                     Popsicles (NO RED)                                                                  Juice: NO CITRUS JUICES: only apple, WHITE grape, WHITE cranberry Sports drinks like Gatorade or Powerade (NO RED)                   The day of surgery:  Drink ONE (1) Pre-Surgery Clear Ensure at  04:30  AM the morning of  surgery. Drink in one sitting. Do not sip.  This drink was given to you during your hospital pre-op appointment visit. Nothing else to drink after completing the Pre-Surgery Clear Ensure : No candy, chewing gum or throat lozenges.    FOLLOW  ANY ADDITIONAL PRE OP INSTRUCTIONS YOU RECEIVED FROM YOUR SURGEON'S OFFICE!!!   Oral Hygiene is also important to reduce your risk of infection.        Remember - BRUSH YOUR TEETH THE MORNING OF SURGERY WITH YOUR REGULAR TOOTHPASTE  Do NOT smoke after Midnight the night before surgery.  STOP TAKING all Vitamins, Herbs and supplements 1 week before your surgery.   Take ONLY these medicines the morning of surgery with A SIP OF WATER: Levothyroxine, You may take Famotidine if needed. You may use your Eye drops, Advair and Albuterol Inhalers.    You may not have any metal on your body including hair pins, jewelry, and body piercing  Do not wear make-up, lotions, powders, perfumes  or deodorant  Do not wear nail polish including gel and S&S, artificial / acrylic nails, or any other type of covering on natural nails including finger and toenails. If you have artificial nails, gel coating, etc., that needs to be removed by a nail salon, Please have this removed prior to surgery. Not doing so may mean that your surgery could be cancelled or delayed if the Surgeon or anesthesia staff feels like they are unable to monitor you safely.    Do not shave 48 hours prior to surgery to avoid nicks in your skin which may contribute to postoperative infections.    Contacts, Hearing Aids, dentures or bridgework may not be worn into surgery. DENTURES WILL BE REMOVED PRIOR TO SURGERY PLEASE DO NOT APPLY "Poly grip" OR ADHESIVES!!!  Patients discharged on the day of surgery will not be allowed to drive home.  Someone NEEDS to stay with you for the first 24 hours after anesthesia.  Do not bring your home medications to the hospital EXCEPT FOR YOUR ALBUTEROL INHALER- PLEASE BRING THIS THE DAY OF YOUR SURGERY. The Pharmacy will dispense medications listed on your medication list to you during your admission in the Hospital.  Please read over the following fact sheets you were given: IF YOU HAVE QUESTIONS ABOUT YOUR PRE-OP INSTRUCTIONS, PLEASE CALL [phone]   [facility] - Preparing for Surgery Before surgery, you can play an important role.  Because skin is not sterile, your skin needs to be as free of germs as possible.  You can reduce the number of germs on your skin by washing with CHG (chlorahexidine gluconate) soap before surgery.  CHG is an antiseptic cleaner which kills germs and bonds with the skin to continue killing germs even after washing. Please DO NOT use if you have an allergy to CHG or antibacterial soaps.  If your skin becomes reddened/irritated stop using the CHG and inform your nurse when you arrive at Short Stay. Do not shave (including legs and underarms) for at least 48 hours prior to the first CHG shower.  You may shave your face/neck.  Please follow these instructions carefully:  1.  Shower with CHG Soap the night before surgery and the  morning of surgery.  2.  If you choose to wash your hair, wash your hair first as usual with your normal  shampoo.  3.  After you shampoo, rinse your hair and body thoroughly to remove the shampoo.  4.  Use CHG as you would any other liquid soap.  You  can apply chg directly to the skin and wash.  Gently with a scrungie or clean washcloth.  5.  Apply the CHG Soap to your body ONLY FROM THE NECK DOWN.   Do not use on face/ open                           Wound or open sores. Avoid contact with eyes, ears mouth and genitals (private parts).                       Wash face,  Genitals (private parts) with your normal soap.             6.  Wash thoroughly, paying special attention to the area where your  surgery  will be performed.  7.  Thoroughly rinse your body with warm water from the neck down.  8.  DO NOT shower/wash with your normal soap after using and rinsing off the CHG Soap.            9.  Pat yourself dry with a clean towel.            10.  Wear clean pajamas.            11.  Place clean sheets on your bed the night of your first shower and do not  sleep with pets.  ON THE DAY OF SURGERY : Do not apply any lotions/deodorants the morning of surgery.  Please wear clean clothes to the hospital/surgery center.     FAILURE TO FOLLOW THESE INSTRUCTIONS MAY RESULT IN THE CANCELLATION OF YOUR SURGERY  PATIENT SIGNATURE_________________________________  NURSE SIGNATURE__________________________________  ________________________________________________________________________         Rogelia Mire    An incentive spirometer is a tool that can help keep your lungs clear and active. This tool measures how well you are filling your lungs with each breath. Taking long deep breaths may help reverse or decrease the chance of developing breathing (pulmonary) problems (especially infection) following: A long period of time when you are unable to move or be active. BEFORE THE PROCEDURE  If the spirometer includes an indicator to show your best effort, your nurse or respiratory therapist will set it to a desired goal. If possible, sit up straight or lean slightly forward. Try not to slouch. Hold the incentive spirometer in an  upright position. INSTRUCTIONS FOR USE  Sit on the edge of your bed if possible, or sit up as far as you can in bed or on a chair. Hold the incentive spirometer in an upright position. Breathe out normally. Place the mouthpiece in your mouth and seal your lips tightly around it. Breathe in slowly and as deeply as possible, raising the piston or the ball toward the top of the column. Hold your breath for 3-5 seconds or for as long as possible. Allow the piston or ball to fall to the bottom of the column. Remove the mouthpiece from your mouth and breathe out normally. Rest for a few seconds and repeat Steps 1 through 7 at least 10 times every 1-2 hours when you are awake. Take your time and take a few normal breaths between deep breaths. The spirometer may include an indicator to show your best effort. Use the indicator as a goal to work toward during each repetition. After each set of 10 deep breaths, practice  coughing to be sure your lungs are clear. If you have an incision (the cut made at the time of surgery), support your incision when coughing by placing a pillow or rolled up towels firmly against it. Once you are able to get out of bed, walk around indoors and cough well. You may stop using the incentive spirometer when instructed by your caregiver.  RISKS AND COMPLICATIONS Take your time so you do not get dizzy or light-headed. If you are in pain, you may need to take or ask for pain medication before doing incentive spirometry. It is harder to take a deep breath if you are having pain. AFTER USE Rest and breathe slowly and easily. It can be helpful to keep track of a log of your progress. Your caregiver can provide you with a simple table to help with this. If you are using the spirometer at home, follow these instructions: SEEK MEDICAL CARE IF:  You are having difficultly using the spirometer. You have trouble using the spirometer as often as instructed. Your pain medication is not  giving enough relief while using the spirometer. You develop fever of 100.5 F (38.1 C) or higher.                                                                                                    SEEK IMMEDIATE MEDICAL CARE IF:  You cough up bloody sputum that had not been present before. You develop fever of 102 F (38.9 C) or greater. You develop worsening pain at or near the incision site. MAKE SURE YOU:  Understand these instructions. Will watch your condition. Will get help right away if you are not doing well or get worse. Document Released: 05/10/2006 Document Revised: 03/22/2011 Document Reviewed: 07/11/2006 [facility] Patient Information 2014 [place], [place].

## 2022-09-13 NOTE — Progress Notes (Signed)
COVID Vaccine received:  []  No [x]  Yes Date of any COVID positive Test in last 90 days:  PCP - Felix Pacini, DO  Cardiologist - Thomasene Ripple, DO  Chest x-ray - 12-12-2019  2v  Epic EKG -  12-14-2021   Epic Stress Test -  ECHO - 03-26-2020  Epic Cardiac Cath -  CTA Cardiac Calcium score:  0 on 03-17-2022 Z[facility]  Monitor- 02-29-2020  PCR screen: []  Ordered & Completed           []   No Order but Needs PROFEND           [x]   N/A for this surgery  Surgery Plan:  [x]  Ambulatory   []  Outpatient in bed  []  Admit  Anesthesia:    []  General  []  Spinal  [x]   Choice []   MAC  Pacemaker / ICD device [x]  No []  Yes   Spinal Cord Stimulator:[x]  No []  Yes       History of Sleep Apnea? [x]  No []  Yes  Sleep Study  03-04-2022 negative for OSA CPAP used?- [x]  No []  Yes    Does the patient monitor blood sugar?  []  No []  Yes  [x]  N/A  Patient has: [x]  NO Hx DM   []  Pre-DM   []  DM1  []   DM2 Last A1c was: 5.4 normal on 12-14-2021      Blood Thinner / Instructions:  none Aspirin Instructions:  none  ERAS Protocol Ordered: []  No  [x]  Yes PRE-SURGERY [x]  ENSURE  []  G2   []  No Drink Ordered Patient is to be NPO after: 04:30  Comments:   Activity level: Patient is able / unable to climb a flight of stairs without difficulty; []  No CP  []  No SOB, but would have ___   Patient can / can not perform ADLs without assistance.   Anesthesia review: Glaucoma, asthma, GERD, PSVT, DOE, PAC, HTN,   Patient denies shortness of breath, fever, cough and chest pain at PAT appointment.  Patient verbalized understanding and agreement to the Pre-Surgical Instructions that were given to them at this PAT appointment. Patient was also educated of the need to review these PAT instructions again prior to her surgery.I reviewed the appropriate phone numbers to call if they have any and questions or concerns.

## 2022-09-14 ENCOUNTER — Encounter (HOSPITAL_COMMUNITY): Payer: Self-pay

## 2022-09-14 ENCOUNTER — Encounter (HOSPITAL_COMMUNITY)
Admission: RE | Admit: 2022-09-14 | Discharge: 2022-09-14 | Disposition: A | Discharge status: Home / Self Care | Payer: Commercial Managed Care - HMO | Source: Ambulatory Visit | Attending: Orthopaedic Surgery | Admitting: Orthopaedic Surgery

## 2022-09-14 ENCOUNTER — Other Ambulatory Visit: Payer: Self-pay

## 2022-09-14 VITALS — BP 125/69 | HR 70 | Temp 98.4°F | Resp 22 | Ht 63.0 in | Wt 233.0 lb

## 2022-09-14 DIAGNOSIS — Z01818 Encounter for other preprocedural examination: Secondary | ICD-10-CM | POA: Insufficient documentation

## 2022-09-14 DIAGNOSIS — I10 Essential (primary) hypertension: Secondary | ICD-10-CM | POA: Diagnosis not present

## 2022-09-14 HISTORY — DX: Pneumonia, unspecified organism: J18.9

## 2022-09-14 HISTORY — DX: Personal history of other diseases of the digestive system: Z87.19

## 2022-09-14 HISTORY — DX: Cardiac arrhythmia, unspecified: I49.9

## 2022-09-14 HISTORY — DX: Essential (primary) hypertension: I10

## 2022-09-14 LAB — BASIC METABOLIC PANEL
Anion gap: 8 (ref 5–15)
BUN: 13 mg/dL (ref 8–23)
CO2: 24 mmol/L (ref 22–32)
Calcium: 8.7 mg/dL — ABNORMAL LOW (ref 8.9–10.3)
Chloride: 105 mmol/L (ref 98–111)
Creatinine, Ser: 0.8 mg/dL (ref 0.44–1.00)
GFR, Estimated: >60 mL/min (ref >60)
Glucose, Bld: 99 mg/dL (ref 70–99)
Potassium: 4.3 mmol/L (ref 3.5–5.1)
Sodium: 137 mmol/L (ref 135–145)

## 2022-09-14 LAB — CBC
HCT: 42.8 % (ref 36.0–46.0)
Hemoglobin: 14.1 g/dL (ref 12.0–15.0)
MCH: 34.6 pg — ABNORMAL HIGH (ref 26.0–34.0)
MCHC: 32.9 g/dL (ref 30.0–36.0)
MCV: 104.9 fL — ABNORMAL HIGH (ref 80.0–100.0)
Platelets: 374 10*3/uL (ref 150–400)
RBC: 4.08 MIL/uL (ref 3.87–5.11)
RDW: 11.9 % (ref 11.5–15.5)
WBC: 8.2 10*3/uL (ref 4.0–10.5)
nRBC: 0 % (ref 0.0–0.2)

## 2022-09-15 DIAGNOSIS — S83241A Other tear of medial meniscus, current injury, right knee, initial encounter: Secondary | ICD-10-CM | POA: Insufficient documentation

## 2022-09-15 DIAGNOSIS — S83231A Complex tear of medial meniscus, current injury, right knee, initial encounter: Secondary | ICD-10-CM | POA: Insufficient documentation

## 2022-09-15 NOTE — Anesthesia Preprocedure Evaluation (Signed)
Anesthesia Evaluation  Patient identified by MRN, date of birth, ID band Patient awake    Reviewed: Allergy & Precautions, NPO status , Patient's Chart, lab work & pertinent test results, reviewed documented beta blocker date and time   History of Anesthesia Complications Negative for: history of anesthetic complications  Airway Mallampati: III  TM Distance: >3 FB Neck ROM: Full    Dental  (+) Missing, Chipped, Dental Advisory Given,    Pulmonary shortness of breath and with exertion, asthma , pneumonia, resolved, former smoker   Pulmonary exam normal breath sounds clear to auscultation       Cardiovascular hypertension, Pt. on medications Normal cardiovascular exam+ dysrhythmias  Rhythm:Regular Rate:Normal   Echo 03/26/20: EF 60-65%, no RWMA, normal RVSF, normal PASP, normal AV/MV   Neuro/Psych  PSYCHIATRIC DISORDERS Anxiety Depression    Glaucoma ou negative neurological ROS     GI/Hepatic Neg liver ROS, hiatal hernia,GERD  ,,  Endo/Other  Hypothyroidism  Morbid obesityHyperlipidemia  Renal/GU negative Renal ROS Bladder dysfunction  Urge urinary incontinence negative genitourinary   Musculoskeletal  (+) Arthritis , Osteoarthritis,  Right knee medial meniscus tear   Abdominal   Peds  Hematology negative hematology ROS (+) Blood dyscrasia, anemia   Anesthesia Other Findings   Reproductive/Obstetrics fibroids                             Anesthesia Physical Anesthesia Plan  ASA: 2  Anesthesia Plan: General   Post-op Pain Management: Minimal or no pain anticipated, Precedex and Tylenol PO (pre-op)*   Induction: Intravenous  PONV Risk Score and Plan: 4 or greater and Treatment may vary due to age or medical condition, Midazolam and Ondansetron  Airway Management Planned: LMA  Additional Equipment: None  Intra-op Plan:   Post-operative Plan: Extubation in OR  Informed Consent:  I have reviewed the patients History and Physical, chart, labs and discussed the procedure including the risks, benefits and alternatives for the proposed anesthesia with the patient or authorized representative who has indicated his/her understanding and acceptance.     Dental advisory given  Plan Discussed with: CRNA and Anesthesiologist  Anesthesia Plan Comments:         Anesthesia Quick Evaluation

## 2022-09-15 NOTE — H&P (Signed)
Cathy Baxter is an 62 y.o. female.   Chief Complaint:  right knee pain with locking and catching HPI: The patient is a 61 year old female well-known to the orthopedic clinic.  She has been dealing with right knee pain and locking catching for a long period of time.  A recent MRI shows a medial meniscal tear of the root of the meniscus in the posterior horn.  She also has areas of significant cartilage loss in the weightbearing surface of the medial compartment of her knee.  Her BMI is 41.27.  She has tried and failed conservative treatment including activity modification and injections in her knee.  At this point she wishes for [place] to proceed with endoscopic intervention and attempt to help with the mechanical symptoms of her knee for the meniscal tear.  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 Selena [facility]   Muscle tension dysphonia    voice hoarness---- ENT , dr h. hall   Pigmentary glaucoma of both eyes    Pneumonia    PSVT (paroxysmal supraventricular tachycardia) 2022   cardiologist--- dr Kirtland Bouchard. [facility];  zio monitor 03-17-2020 PSVT which is likely AT, rarely symptomic PACs;  echo 03-26-2020, normal;  cardiac CTA 03-17-2022  calcium score=zero   Right knee meniscal tear    RLS (restless legs syndrome)    Urge urinary incontinence    Uterine fibroid    Varicose veins of both legs with edema    vascular--- dr Judie Petit. [place]   Wears partial dentures    upper    Past Surgical History:  Procedure Laterality  Date   CATARACT EXTRACTION W/ INTRAOCULAR LENS IMPLANT Bilateral 2020   staged same year each eye,  glaucoma surgery w/ stent insertion   CHOLECYSTECTOMY, LAPAROSCOPIC  2002   COLONOSCOPY  2014   ESOPHAGOGASTRODUODENOSCOPY  10/04/2017   dr outlaw   HYSTEROSCOPY WITH D & C N/A 11/13/2020   Procedure: DILATATION AND CURETTAGE /HYSTEROSCOPY;  Surgeon: Mitchel Honour, DO;  Location: [facility] OR;  Service: Gynecology;  Laterality: N/A;  rep Tobi Bastos to cover case confirmed on 11/10/20 [facility]   KNEE ARTHROSCOPY Left 01/26/2018   @[facility]  by dr c. Jeanette Rauth   NASAL SINUS SURGERY  2000    Family History  Problem Relation Age of Onset   Heart disease Mother    Asthma Mother    COPD Mother    Early death Father    Cancer Son 55       Neuroepithelioma    Breast cancer Sister 72   Celiac disease Daughter    Allergic rhinitis Neg Hx    Eczema Neg Hx    Urticaria Neg Hx    Angioedema Neg Hx    Immunodeficiency Neg Hx    Atopy Neg Hx    Social History:  reports that she quit smoking about 39 years ago. Her smoking use included cigarettes. She started smoking about 51 years ago. She has a  36 pack-year smoking history. She has never used smokeless tobacco. She reports that she does not currently use alcohol. She reports that she does not use drugs.  Allergies:  Allergies  Allergen Reactions   Azithromycin Palpitations    Arrhythmia side effects   Oxycodone Shortness Of Breath   Penicillins Anaphylaxis and Itching   Amoxicillin Hives    Has patient had a PCN reaction causing immediate rash, facial/tongue/throat swelling, SOB or lightheadedness with hypotension: Yes Has patient had a PCN reaction causing severe rash involving mucus membranes or skin necrosis: No Has patient had a PCN reaction that required hospitalization No Has patient had a PCN reaction occurring within the last 10 years: Yes  If all of the above answers are "NO", then may proceed with Cephalosporin use.    Doxycycline Itching    Prednisone     Caution>> severe glaucoma.    Demerol [Meperidine] Palpitations    No medications prior to admission.    No results found for this or any previous visit (from the past 48 hour(s)). No results found.  Review of Systems  Height 5\' 3"  (1.6 m), weight 107 kg. Physical Exam Vitals reviewed.  Constitutional:      Appearance: Normal appearance. She is obese.  HENT:     Head: Normocephalic and atraumatic.  Eyes:     Extraocular Movements: Extraocular movements intact.     Pupils: Pupils are equal, round, and reactive to light.  Cardiovascular:     Rate and Rhythm: Normal rate.     Pulses: Normal pulses.  Pulmonary:     Effort: Pulmonary effort is normal.     Breath sounds: Normal breath sounds.  Abdominal:     Palpations: Abdomen is soft.  Musculoskeletal:     Cervical back: Normal range of motion and neck supple.     Right knee: Effusion, bony tenderness and crepitus present. Decreased range of motion. Tenderness present over the medial joint line and lateral joint line. Abnormal alignment and abnormal meniscus.  Neurological:     Mental Status: She is alert and oriented to person, place, and time.  Psychiatric:        Behavior: Behavior normal.      Assessment/Plan Right knee with complex medial meniscal tear and moderate osteoarthritis  The plan is to proceed to surgery for an outpatient arthroscopic intervention for the right knee.  This will include a partial medial meniscectomy and assessing the cartilage of the weightbearing aspect of her knee.  The risks and benefits of surgery been discussed in detail.  Kathryne Hitch, MD 09/15/2022, 6:26 PM

## 2022-09-16 ENCOUNTER — Other Ambulatory Visit: Payer: Self-pay

## 2022-09-16 ENCOUNTER — Ambulatory Visit (HOSPITAL_COMMUNITY)
Admission: RE | Admit: 2022-09-16 | Discharge: 2022-09-16 | Disposition: A | Discharge status: Home / Self Care | Payer: Commercial Managed Care - HMO | Attending: Orthopaedic Surgery | Admitting: Orthopaedic Surgery

## 2022-09-16 ENCOUNTER — Encounter (HOSPITAL_COMMUNITY): Payer: Self-pay | Admitting: Orthopaedic Surgery

## 2022-09-16 ENCOUNTER — Ambulatory Visit (HOSPITAL_COMMUNITY): Payer: Commercial Managed Care - HMO | Admitting: Anesthesiology

## 2022-09-16 ENCOUNTER — Ambulatory Visit (HOSPITAL_BASED_OUTPATIENT_CLINIC_OR_DEPARTMENT_OTHER): Payer: Commercial Managed Care - HMO | Admitting: Anesthesiology

## 2022-09-16 ENCOUNTER — Encounter (HOSPITAL_COMMUNITY)
Admission: RE | Disposition: A | Discharge status: Home / Self Care | Payer: Self-pay | Source: Home / Self Care | Attending: Orthopaedic Surgery

## 2022-09-16 DIAGNOSIS — Z6841 Body Mass Index (BMI) 40.0 and over, adult: Secondary | ICD-10-CM | POA: Diagnosis not present

## 2022-09-16 DIAGNOSIS — I10 Essential (primary) hypertension: Secondary | ICD-10-CM | POA: Diagnosis not present

## 2022-09-16 DIAGNOSIS — X58XXXD Exposure to other specified factors, subsequent encounter: Secondary | ICD-10-CM | POA: Diagnosis not present

## 2022-09-16 DIAGNOSIS — S83241D Other tear of medial meniscus, current injury, right knee, subsequent encounter: Secondary | ICD-10-CM | POA: Diagnosis present

## 2022-09-16 DIAGNOSIS — S83231A Complex tear of medial meniscus, current injury, right knee, initial encounter: Secondary | ICD-10-CM | POA: Insufficient documentation

## 2022-09-16 DIAGNOSIS — S83231D Complex tear of medial meniscus, current injury, right knee, subsequent encounter: Secondary | ICD-10-CM | POA: Diagnosis not present

## 2022-09-16 DIAGNOSIS — Z01818 Encounter for other preprocedural examination: Secondary | ICD-10-CM

## 2022-09-16 DIAGNOSIS — M1990 Unspecified osteoarthritis, unspecified site: Secondary | ICD-10-CM | POA: Insufficient documentation

## 2022-09-16 DIAGNOSIS — Z87891 Personal history of nicotine dependence: Secondary | ICD-10-CM | POA: Insufficient documentation

## 2022-09-16 DIAGNOSIS — J4540 Moderate persistent asthma, uncomplicated: Secondary | ICD-10-CM | POA: Diagnosis not present

## 2022-09-16 DIAGNOSIS — K219 Gastro-esophageal reflux disease without esophagitis: Secondary | ICD-10-CM | POA: Insufficient documentation

## 2022-09-16 DIAGNOSIS — S83241A Other tear of medial meniscus, current injury, right knee, initial encounter: Secondary | ICD-10-CM | POA: Insufficient documentation

## 2022-09-16 HISTORY — DX: Moderate persistent asthma, uncomplicated: J45.40

## 2022-09-16 HISTORY — DX: Allergic rhinitis, unspecified: J30.9

## 2022-09-16 HISTORY — DX: Varicose veins of bilateral lower extremities with other complications: I83.893

## 2022-09-16 HISTORY — DX: Restless legs syndrome: G25.81

## 2022-09-16 HISTORY — DX: Unspecified tear of unspecified meniscus, current injury, right knee, initial encounter: S83.206A

## 2022-09-16 HISTORY — DX: Hypothyroidism, unspecified: E03.9

## 2022-09-16 HISTORY — DX: Supraventricular tachycardia, unspecified: I47.10

## 2022-09-16 HISTORY — DX: Autoimmune thyroiditis: E06.3

## 2022-09-16 HISTORY — DX: Dysphonia: R49.0

## 2022-09-16 HISTORY — DX: Somnolence: R40.0

## 2022-09-16 HISTORY — DX: Urge incontinence: N39.41

## 2022-09-16 HISTORY — DX: Presence of dental prosthetic device (complete) (partial): Z97.2

## 2022-09-16 HISTORY — PX: KNEE ARTHROSCOPY: SHX127

## 2022-09-16 SURGERY — ARTHROSCOPY, KNEE
Anesthesia: General | Site: Knee | Laterality: Right

## 2022-09-16 MED ORDER — SODIUM CHLORIDE 0.9 % IR SOLN
Status: DC | PRN
  Administered 2022-09-16: 6000 mL

## 2022-09-16 MED ORDER — ONDANSETRON HCL 4 MG/2ML IJ SOLN
INTRAMUSCULAR | Status: AC
  Filled 2022-09-16: qty 2

## 2022-09-16 MED ORDER — CHLORHEXIDINE GLUCONATE 0.12 % MT SOLN
15.0000 mL | Freq: Once | OROMUCOSAL | Status: AC
  Administered 2022-09-16: 15 mL via OROMUCOSAL

## 2022-09-16 MED ORDER — CEFAZOLIN SODIUM-DEXTROSE 2-4 GM/100ML-% IV SOLN
2.0000 g | INTRAVENOUS | Status: AC
  Administered 2022-09-16: 2 g via INTRAVENOUS

## 2022-09-16 MED ORDER — LACTATED RINGERS IV SOLN: INTRAVENOUS | Status: DC

## 2022-09-16 MED ORDER — BUPIVACAINE HCL (PF) 0.5 % IJ SOLN
INTRAMUSCULAR | Status: DC | PRN
  Administered 2022-09-16: 30 mL

## 2022-09-16 MED ORDER — HYDROCODONE-ACETAMINOPHEN 7.5-325 MG PO TABS: 1.0000 | ORAL_TABLET | Freq: Once | ORAL | Status: DC | PRN

## 2022-09-16 MED ORDER — ONDANSETRON HCL 4 MG/2ML IJ SOLN: 4.0000 mg | Freq: Once | INTRAMUSCULAR | Status: DC | PRN

## 2022-09-16 MED ORDER — AMISULPRIDE (ANTIEMETIC) 5 MG/2ML IV SOLN: 10.0000 mg | Freq: Once | INTRAVENOUS | Status: DC | PRN

## 2022-09-16 MED ORDER — MIDAZOLAM HCL 2 MG/2ML IJ SOLN
INTRAMUSCULAR | Status: DC | PRN
  Administered 2022-09-16: 2 mg via INTRAVENOUS

## 2022-09-16 MED ORDER — HYDROMORPHONE HCL 1 MG/ML IJ SOLN
0.2500 mg | INTRAMUSCULAR | Status: DC | PRN
  Administered 2022-09-16 (×2): 0.5 mg via INTRAVENOUS

## 2022-09-16 MED ORDER — MORPHINE SULFATE (PF) 4 MG/ML IV SOLN
INTRAVENOUS | Status: AC
  Filled 2022-09-16: qty 1

## 2022-09-16 MED ORDER — LIDOCAINE 2% (20 MG/ML) 5 ML SYRINGE
INTRAMUSCULAR | Status: DC | PRN
  Administered 2022-09-16: 60 mg via INTRAVENOUS

## 2022-09-16 MED ORDER — PROPOFOL 10 MG/ML IV BOLUS
INTRAVENOUS | Status: DC | PRN
  Administered 2022-09-16: 150 mg via INTRAVENOUS

## 2022-09-16 MED ORDER — MIDAZOLAM HCL 2 MG/2ML IJ SOLN
INTRAMUSCULAR | Status: AC
  Filled 2022-09-16: qty 2

## 2022-09-16 MED ORDER — PROPOFOL 10 MG/ML IV BOLUS
INTRAVENOUS | Status: AC
  Filled 2022-09-16: qty 20

## 2022-09-16 MED ORDER — ORAL CARE MOUTH RINSE: 15.0000 mL | Freq: Once | OROMUCOSAL | Status: AC

## 2022-09-16 MED ORDER — HYDROMORPHONE HCL 1 MG/ML IJ SOLN
INTRAMUSCULAR | Status: AC
  Filled 2022-09-16: qty 1

## 2022-09-16 MED ORDER — FENTANYL CITRATE (PF) 100 MCG/2ML IJ SOLN
INTRAMUSCULAR | Status: AC
  Filled 2022-09-16: qty 2

## 2022-09-16 MED ORDER — HYDROCODONE-ACETAMINOPHEN 10-325 MG PO TABS: 1.0000 | ORAL_TABLET | Freq: Four times a day (QID) | ORAL | 0 refills | Status: DC | PRN

## 2022-09-16 MED ORDER — CEFAZOLIN SODIUM-DEXTROSE 2-4 GM/100ML-% IV SOLN
INTRAVENOUS | Status: AC
  Filled 2022-09-16: qty 100

## 2022-09-16 MED ORDER — ONDANSETRON HCL 4 MG/2ML IJ SOLN
INTRAMUSCULAR | Status: DC | PRN
  Administered 2022-09-16: 4 mg via INTRAVENOUS

## 2022-09-16 MED ORDER — LIDOCAINE HCL (PF) 2 % IJ SOLN
INTRAMUSCULAR | Status: AC
  Filled 2022-09-16: qty 5

## 2022-09-16 MED ORDER — MORPHINE SULFATE (PF) 4 MG/ML IV SOLN
INTRAVENOUS | Status: DC | PRN
  Administered 2022-09-16: 4 mg

## 2022-09-16 MED ORDER — BUPIVACAINE HCL (PF) 0.5 % IJ SOLN
INTRAMUSCULAR | Status: AC
  Filled 2022-09-16: qty 30

## 2022-09-16 MED ORDER — FENTANYL CITRATE (PF) 100 MCG/2ML IJ SOLN
INTRAMUSCULAR | Status: DC | PRN
  Administered 2022-09-16 (×4): 25 ug via INTRAVENOUS

## 2022-09-16 SURGICAL SUPPLY — 25 items
BAG COUNTER SPONGE SURGICOUNT (BAG) IMPLANT
BAG SPNG CNTER NS LX DISP (BAG)
BNDG CMPR 6 X 5 YARDS HK CLSR (GAUZE/BANDAGES/DRESSINGS) ×1
BNDG ELASTIC 6INX 5YD STR LF (GAUZE/BANDAGES/DRESSINGS) ×1 IMPLANT
COVER SURGICAL LIGHT HANDLE (MISCELLANEOUS) ×1 IMPLANT
DISSECTOR 3.8MM X 13CM (MISCELLANEOUS) IMPLANT
DRAPE U-SHAPE 47X51 STRL (DRAPES) ×1 IMPLANT
DURAPREP 26ML APPLICATOR (WOUND CARE) ×1 IMPLANT
GAUZE 4X4 16PLY ~~LOC~~+RFID DBL (SPONGE) ×1 IMPLANT
GAUZE PAD ABD 8X10 STRL (GAUZE/BANDAGES/DRESSINGS) ×1 IMPLANT
GAUZE SPONGE 4X4 12PLY STRL (GAUZE/BANDAGES/DRESSINGS) ×1 IMPLANT
GAUZE XEROFORM 1X8 LF (GAUZE/BANDAGES/DRESSINGS) ×1 IMPLANT
GLOVE BIO SURGEON STRL SZ7.5 (GLOVE) ×1 IMPLANT
GLOVE BIOGEL PI IND STRL 8 (GLOVE) ×2 IMPLANT
GLOVE ECLIPSE 8.0 STRL XLNG CF (GLOVE) ×1 IMPLANT
GOWN STRL REUS W/ TWL XL LVL3 (GOWN DISPOSABLE) ×2 IMPLANT
GOWN STRL REUS W/TWL XL LVL3 (GOWN DISPOSABLE) ×2
KIT TURNOVER KIT A (KITS) IMPLANT
MANIFOLD NEPTUNE II (INSTRUMENTS) ×1 IMPLANT
PACK ARTHROSCOPY WL (CUSTOM PROCEDURE TRAY) ×1 IMPLANT
PADDING CAST COTTON 6X4 STRL (CAST SUPPLIES) ×1 IMPLANT
PROTECTOR NERVE ULNAR (MISCELLANEOUS) ×1 IMPLANT
SUT ETHILON 4 0 PS 2 18 (SUTURE) ×1 IMPLANT
TUBING ARTHROSCOPY IRRIG 16FT (MISCELLANEOUS) ×1 IMPLANT
WRAP KNEE MAXI GEL POST OP (GAUZE/BANDAGES/DRESSINGS) ×1 IMPLANT

## 2022-09-16 NOTE — Anesthesia Postprocedure Evaluation (Signed)
Anesthesia Post Note  Patient: Mihaela Lehmkuhl  Procedure(s) Performed: RIGHT KNEE ARTHROSCOPY WITH PARTIAL MEDIAL MENISCECTOMY (Right: Knee)     Patient location during evaluation: PACU Anesthesia Type: General Level of consciousness: awake and alert and oriented Pain management: pain level controlled Vital Signs Assessment: post-procedure vital signs reviewed and stable Respiratory status: spontaneous breathing, nonlabored ventilation and respiratory function stable Cardiovascular status: blood pressure returned to baseline and stable Postop Assessment: no apparent nausea or vomiting Anesthetic complications: no   No notable events documented.  Last Vitals:  Vitals:   09/16/22 0900 09/16/22 0915  BP: (!) 127/90 (!) 130/91  Pulse: 83 87  Resp: 12   Temp: 36.6 C   SpO2: 96% 93%    Last Pain:  Vitals:   09/16/22 0900  TempSrc:   PainSc: 2                  Cylah Fannin A.

## 2022-09-16 NOTE — Op Note (Signed)
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 Magnus Ivan, MD Assistant: Rexene Edison, PA-C  Anesthesia: #1 General, #2 local EBL: Minimal Complications: None Antibiotics: IV Ancef  Indications: The patient is a 62 year old female well-known to me.  She has had debilitating right knee pain for some time now and has tried and failed conservative treatment for that knee.  She developed mechanical symptoms with locking and catching.  A MRI was eventually obtained showing a complex posterior horn to mid body medial meniscal tear.  Given her continued mechanical symptoms combined with failure conservative treatment she did wish to proceed with an arthroscopic intervention for the right knee.  We have done this before and her left knee years ago.  She understands the risk and benefits of the surgery in detail and understands this will not address her arthritis.  She does have a BMI of 41.27 and does understand that weight loss will help her knees and the most from a standpoint of her arthritis.  Procedure description: After informed consent was obtained and the appropriate right knee was marked, the patient was brought to the operating room and placed supine on the operating table.  General anesthesia was then obtained.  Her right thigh, knee, leg and ankle were prepped and draped in DuraPrep and sterile drapes including a sterile stockinette.  With the bed raised and a lateral leg push utilized, the right operative leg and knee was flexed off the side of the table.  A timeout was called and she was then divided correct patient and the correct right  knee.  An anterior lateral arthroscopy portal was then made and the camera placed into the knee.  We went to the medial compartment and it made an anterior medial incision.  Right away we found a complex medial meniscal tear from the posterior horn and root to the mid body.  Using an arthroscopic shaver as well as basket forceps/biters we performed a partial medial meniscectomy and fortunately she has meniscal tissue remaining for cushioning purposes.  We did find an area of grade 3 to almost grade IV chondromalacia over the medial femoral condyle.  The medial tibial plateau looked okay.  We assessed the intercondylar area of the knee and the ACL PCL were intact.  The lateral compartment was assessed with the knee in a figure-of-four position and the lateral compartment looked good in terms of the cartilage and no meniscal tear.  Finally we assessed the patellofemoral joint.  Both her knees hyperextend.  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  We then allowed fluid to lavage the knee and then removed auscultation from the knee.  The portal sites were closed with nylon suture.  Marcaine with morphine was infiltrated into the portal sites in the knee itself.  Well-padded sterile dressings applied.  The patient was awakened, extubated and taken to the recovery room in stable condition.  Postop we will give her wound care instructions as well as weight-bear instructions.  Will see her back in the office in a week.  We will allow her to weight-bear as tolerated.

## 2022-09-16 NOTE — Anesthesia Procedure Notes (Signed)
Procedure Name: LMA Insertion Date/Time: 09/16/2022 7:28 AM  Performed by: Elyn Peers, CRNAPre-anesthesia Checklist: Patient identified, Emergency Drugs available, Suction available, Patient being monitored and Timeout performed Patient Re-evaluated:Patient Re-evaluated prior to induction Oxygen Delivery Method: Circle system utilized Preoxygenation: Pre-oxygenation with 100% oxygen Induction Type: IV induction Ventilation: Mask ventilation without difficulty LMA: LMA inserted LMA Size: 4.0 Number of attempts: 1 Placement Confirmation: positive ETCO2 and breath sounds checked- equal and bilateral Tube secured with: Tape Dental Injury: Teeth and Oropharynx as per pre-operative assessment

## 2022-09-16 NOTE — Interval H&P Note (Signed)
History and Physical Interval Note: The patient understands that she is here today for a right knee arthroscopy to assess and treat her right knee medial meniscal tear as well as assess the cartilage wear in her right knee.  There has been no acute or interval change in her medical status.  The risks and benefits of surgery been discussed in detail and informed consent has been obtained.  The right operative knee has been marked.  09/16/2022 7:16 AM  Cathy Baxter  has presented today for surgery, with the diagnosis of right knee medial meniscal tear.  The various methods of treatment have been discussed with the patient and family. After consideration of risks, benefits and other options for treatment, the patient has consented to  Procedure(s): RIGHT KNEE ARTHROSCOPY WITH DEBRIDEMENT AND PARTIAL MEDIAL MENISCECTOMY (Right) as a surgical intervention.  The patient's history has been reviewed, patient examined, no change in status, stable for surgery.  I have reviewed the patient's chart and labs.  Questions were answered to the patient's satisfaction.     Kathryne Hitch

## 2022-09-16 NOTE — Transfer of Care (Signed)
Immediate Anesthesia Transfer of Care Note  Patient: Cathy Baxter  Procedure(s) Performed: RIGHT KNEE ARTHROSCOPY WITH PARTIAL MEDIAL MENISCECTOMY (Right: Knee)  Patient Location: PACU  Anesthesia Type:General  Level of Consciousness: awake, alert , and oriented  Airway & Oxygen Therapy: Patient Spontanous Breathing and Patient connected to face mask oxygen  Post-op Assessment: Report given to RN and Post -op Vital signs reviewed and stable  Post vital signs: Reviewed and stable  Last Vitals:  Vitals Value Taken Time  BP 141/111 09/16/22 0810  Temp    Pulse 90 09/16/22 0811  Resp 15 09/16/22 0811  SpO2 100 % 09/16/22 0811  Vitals shown include unfiled device data.  Last Pain:  Vitals:   09/16/22 0641  TempSrc:   PainSc: 0-No pain         Complications: No notable events documented.

## 2022-09-16 NOTE — Discharge Instructions (Addendum)
You may put all of your weight on your right lower extremity as comfort allows. Do ice and elevate your knee periodically throughout the day today in the next few days. Increase your activities as comfort allows. In 24 hours you may remove all of your dressings and shower getting your incisions wet. After each shower do place Band-Aids over your incisions daily to protect the sutures. You can take 3-4 Advil 2-3 times a day with meals as needed for pain and inflammation. Other narcotic pain medication has been sent in to your local pharmacy.

## 2022-09-17 ENCOUNTER — Encounter (HOSPITAL_COMMUNITY): Payer: Self-pay | Admitting: Orthopaedic Surgery

## 2022-09-18 ENCOUNTER — Emergency Department (HOSPITAL_BASED_OUTPATIENT_CLINIC_OR_DEPARTMENT_OTHER): Payer: Commercial Managed Care - HMO

## 2022-09-18 ENCOUNTER — Emergency Department (HOSPITAL_BASED_OUTPATIENT_CLINIC_OR_DEPARTMENT_OTHER)
Admission: EM | Admit: 2022-09-18 | Discharge: 2022-09-18 | Disposition: A | Discharge status: Home / Self Care | Payer: Commercial Managed Care - HMO | Attending: Emergency Medicine | Admitting: Emergency Medicine

## 2022-09-18 ENCOUNTER — Other Ambulatory Visit: Payer: Self-pay

## 2022-09-18 ENCOUNTER — Encounter (HOSPITAL_BASED_OUTPATIENT_CLINIC_OR_DEPARTMENT_OTHER): Payer: Self-pay | Admitting: Emergency Medicine

## 2022-09-18 DIAGNOSIS — M79605 Pain in left leg: Secondary | ICD-10-CM | POA: Insufficient documentation

## 2022-09-18 DIAGNOSIS — M79604 Pain in right leg: Secondary | ICD-10-CM | POA: Insufficient documentation

## 2022-09-18 DIAGNOSIS — M79661 Pain in right lower leg: Secondary | ICD-10-CM

## 2022-09-18 NOTE — ED Triage Notes (Signed)
Pt c/o BLE (calf) pain that she woke up with today; had RT knee arthroscopy on Thurs

## 2022-09-18 NOTE — ED Provider Notes (Signed)
[facility] AT [facility] Provider Note   CSN: [id number] Arrival date & time: 09/18/22  1809     History  Chief Complaint  Patient presents with   Leg Pain    Cathy Baxter is a 62 y.o. female.   Leg Pain Lower extremity pain location: Bilateral calves. Pain details:    Quality:  Aching  Presents  due to concerns of bilaterally calf pain that she said hurts when she walks but get better when she sits. She recently had a right  knee surgery on the 9/5. States she was itching so bad after the surgery but that resolved the day after.   She started noticing calf pain today when she woke up and could hardly walk so she came to the ED.         Home Medications Prior to Admission medications   Medication Sig Start Date End Date Taking? Authorizing Provider  albuterol (VENTOLIN HFA) 108 (90 Base) MCG/ACT inhaler Inhale 2 puffs into the lungs every 4 (four) hours as needed for wheezing or shortness of breath (coughing fits). 11/05/21   Ellamae Sia, DO  brimonidine (ALPHAGAN) 0.2 % ophthalmic solution Place 1 drop into both eyes 2 (two) times daily. 02/25/18   [provider]  Cholecalciferol (VITAMIN D3) 75 MCG (3000 UT) TABS Take 3,000 Units by mouth daily.    [provider]  dorzolamide-timolol (COSOPT) 22.3-6.8 MG/ML ophthalmic solution Place 1 drop into both eyes 2 (two) times daily. 09/18/15   [provider]  esomeprazole (NEXIUM) 20 MG capsule Take 20 mg by mouth daily at 12 noon.    [provider]  famotidine (PEPCID) 20 MG tablet Take 1 tablet (20 mg total) by mouth 2 (two) times daily. Patient taking differently: Take 20 mg by mouth 2 (two) times daily as needed for heartburn. 03/18/22   Ellamae Sia, DO  fexofenadine (ALLEGRA) 180 MG tablet Take 180 mg by mouth daily as needed for allergies or rhinitis.    [provider]  fluticasone-salmeterol (ADVAIR) 250-50 MCG/ACT AEPB Inhale 1 puff into the lungs  in the morning and at bedtime. Rinse mouth after each use. 03/18/22   Ellamae Sia, DO  HYDROcodone-acetaminophen (NORCO) 10-325 MG tablet Take 1 tablet by mouth every 6 (six) hours as needed. 09/16/22   Kathryne Hitch, MD  levocetirizine (XYZAL) 5 MG tablet Take 0.5 tablets (2.5 mg total) by mouth every evening. Patient taking differently: Take 2.5 mg by mouth daily as needed for allergies. 03/18/22   Ellamae Sia, DO  levothyroxine (SYNTHROID) 25 MCG tablet Take 1 tablet (25 mcg total) by mouth daily before breakfast. 05/13/22   Kuneff, Renee A, DO  meloxicam (MOBIC) 7.5 MG tablet Take 1 tablet (7.5 mg total) by mouth daily as needed for pain. Patient not taking: Reported on 09/10/2022 07/08/22   Vivi Barrack, DPM  sodium chloride (OCEAN) 0.65 % SOLN nasal spray Place 1 spray into both nostrils as needed for congestion.    [provider]  vitamin B-12 (CYANOCOBALAMIN) 1000 MCG tablet Take 1,000 mcg by mouth daily.    [provider]      Allergies    Azithromycin, Oxycodone, Penicillins, Amoxicillin, Doxycycline, Prednisone, and Demerol [meperidine]    Review of Systems   Review of Systems  Cardiovascular:  Negative for chest pain, palpitations and leg swelling.    Physical Exam Updated Vital Signs BP 132/70 (BP Location: Right Arm)   Pulse 72  Temp 97.6 F (36.4 C) (Oral)   Resp 17   Ht 5\' 3"  (1.6 m)   Wt 105.7 kg   SpO2 96%   BMI 41.28 kg/m   Physical Exam Constitutional:      Appearance: Normal appearance. She is obese.  HENT:     Head: Normocephalic and atraumatic.  Cardiovascular:     Rate and Rhythm: Normal rate and regular rhythm.     Pulses: Normal pulses.     Heart sounds: Normal heart sounds.  Musculoskeletal:        General: Swelling and tenderness present. No deformity or signs of injury.     Right lower leg: Edema present.     Left lower leg: Edema present.  Skin:    General: Skin is warm and dry.  Neurological:     Mental Status:  She is alert.     ED Results / Procedures / Treatments   Labs (all labs ordered are listed, but only abnormal results are displayed) Labs Reviewed - No data to display  EKG None  Radiology US Venous Img Lower Bilateral  Result Date: 09/18/2022 CLINICAL DATA:  Status post right knee surgery on September 16, 2022, presenting with bilateral calf pain. EXAM: BILATERAL LOWER EXTREMITY VENOUS DOPPLER ULTRASOUND TECHNIQUE: Gray-scale sonography with graded compression, as well as color Doppler and duplex ultrasound were performed to evaluate the lower extremity deep venous systems from the level of the common femoral vein and including the common femoral, femoral, profunda femoral, popliteal and calf veins including the posterior tibial, peroneal and gastrocnemius veins when visible. The superficial great saphenous vein was also interrogated. Spectral Doppler was utilized to evaluate flow at rest and with distal augmentation maneuvers in the common femoral, femoral and popliteal veins. COMPARISON:  None Available. FINDINGS: RIGHT LOWER EXTREMITY Common Femoral Vein: No evidence of thrombus. Normal compressibility, respiratory phasicity and response to augmentation. Saphenofemoral Junction: No evidence of thrombus. Normal compressibility and flow on color Doppler imaging. Profunda Femoral Vein: No evidence of thrombus. Normal compressibility and flow on color Doppler imaging. Femoral Vein: No evidence of thrombus. Normal compressibility, respiratory phasicity and response to augmentation. Popliteal Vein: No evidence of thrombus. Normal compressibility, respiratory phasicity and response to augmentation. Calf Veins: No evidence of thrombus. Normal compressibility and flow on color Doppler imaging. Superficial Great Saphenous Vein: No evidence of thrombus. Normal compressibility. Venous Reflux:  None. Other Findings:  None. LEFT LOWER EXTREMITY Common Femoral Vein: No evidence of thrombus. Normal compressibility,  respiratory phasicity and response to augmentation. Saphenofemoral Junction: No evidence of thrombus. Normal compressibility and flow on color Doppler imaging. Profunda Femoral Vein: No evidence of thrombus. Normal compressibility and flow on color Doppler imaging. Femoral Vein: No evidence of thrombus. Normal compressibility, respiratory phasicity and response to augmentation. Popliteal Vein: No evidence of thrombus. Normal compressibility, respiratory phasicity and response to augmentation. Calf Veins: No evidence of thrombus. Normal compressibility and flow on color Doppler imaging. Superficial Great Saphenous Vein: No evidence of thrombus. Normal compressibility. Venous Reflux:  None. Other Findings:  None. IMPRESSION: No evidence of deep venous thrombosis in either lower extremity. Electronically Signed   By: Aram Candela M.D.   On: 09/18/2022 20:22    Procedures Procedures    Medications Ordered in ED Medications - No data to display  ED Course/ Medical Decision Making/ A&P  Medical Decision Making  This patient is a 62 y.o. female who presents to the ED for concern of Bilateral calf pain s/p right knee arthroplasty 2 days ago , this involves an extensive number of treatment options, and is a complaint that carries with it a high risk of complications and morbidity. The emergent differential diagnosis prior to evaluation includes, but is not limited to, DVT, Muscle pain, claudications . This is not an exhaustive differential.  Patient had right knee surgery 2 days ago, was not  hospitalized and sent home, patient was not bedridden for more than 3 days, appreciate any varicose veins on exam, less of pitting edema, paralysis ,patient has no previous history of DVT, less suspicion of DVT at this time but cannot be ruled out entirely.  Physical Exam: Physical exam performed. The pertinent findings include: No signs of Bilateral leg edema , varicose veins.     Imaging Studies: To further assess the possibility of a DVT ,I ordered  DVT study bilateral leg [place]. I independently visualized and interpreted imaging which showed no evidence of DVT. I agree with the radiologist interpretation.   Disposition: DVT studies lower extremities bilaterally was unremarkable .After consideration of the diagnostic results, I feel that the emergency department workup does not suggest an emergent condition requiring admission or immediate intervention beyond what has been performed at this time. The plan is to discharge the patient and have her follow-up with her PCP if leg pain persist.  Patient was prescribed hydrocodone after her procedure, she continues taking it for pain when needed. The patient is safe for discharge and has been instructed to return immediately for worsening symptoms, change in symptoms or any other concerns.  I discussed this case with my attending physician Dr. Adela Lank who cosigned this note including patient's presenting symptoms, physical exam, and planned diagnostics and interventions. Attending physician stated agreement with plan or made changes to plan which were implemented.           Final Clinical Impression(s) / ED Diagnoses Final diagnoses:  Bilateral calf pain    Rx / DC Orders ED Discharge Orders     None         Kathleen Lime, MD 09/18/22 2102    Melene Plan, DO 09/18/22 2308

## 2022-09-18 NOTE — ED Notes (Signed)
Ultrasound bedside for exam 

## 2022-09-18 NOTE — Discharge Instructions (Addendum)
Dear Ms Quine, It was a pleasure taking care of you at the ED. We are discharging you home now that all your imaging studies came back negative and there is no evidence of DVT at this time. Please follow the following instructions.  1) Please follow-up with your PCP if your leg pain does not get better in the next week 2) I have attached signs and symptoms of DVT. please come back to see [place] when you see any of those signs  Take care,  Dr. Kathleen Lime, MD

## 2022-09-23 ENCOUNTER — Encounter: Payer: Self-pay | Admitting: Orthopaedic Surgery

## 2022-09-23 ENCOUNTER — Ambulatory Visit (INDEPENDENT_AMBULATORY_CARE_PROVIDER_SITE_OTHER): Payer: Commercial Managed Care - HMO | Admitting: Orthopaedic Surgery

## 2022-09-23 DIAGNOSIS — Z9889 Other specified postprocedural states: Secondary | ICD-10-CM

## 2022-09-23 NOTE — Progress Notes (Signed)
The patient is here today for first postoperative follow-up visit status post a right knee arthroscopy.  We found significant cartilage wear of the patellofemoral joint and moderate cartilage wear the medial compartment of the knee.  There was a significant medial meniscal tear.  The lateral compartment and ACL and PCL look good.  She is doing well overall.  She is walk with a limp.  She is someone who has a BMI of over 41 but does not It is more of her immobility.  She feels like she is ready to try to get up and get on the treadmill and start a pedaling device as well.  Her right knee looks good overall.  Her calf is soft.  The sutures have been removed from the knee arthroscopy portal sites.  She fortunately does not have a big effusion at all either.  I did go over the arthroscopy pictures with her.  I want her to work on slowly increasing her activity as comfort allows.  We will see her back in 4 weeks for repeat exam.

## 2022-10-07 ENCOUNTER — Other Ambulatory Visit: Payer: Self-pay | Admitting: Podiatry

## 2022-10-15 ENCOUNTER — Other Ambulatory Visit (HOSPITAL_COMMUNITY): Payer: Self-pay

## 2022-10-15 ENCOUNTER — Telehealth: Payer: Self-pay | Admitting: Pharmacy Technician

## 2022-10-15 NOTE — Telephone Encounter (Signed)
Pharmacy Patient Advocate Encounter   Received notification from Fax that prior authorization for wegovy is required/requested.   Insurance verification completed.   The patient is insured through [employer] .   Per test claim: PA required; PA started via CoverMyMeds. KEY [id number] . Waiting for clinical questions to populate.

## 2022-10-18 ENCOUNTER — Other Ambulatory Visit (HOSPITAL_COMMUNITY): Payer: Self-pay

## 2022-10-21 ENCOUNTER — Encounter: Payer: Managed Care, Other (non HMO) | Admitting: Orthopaedic Surgery

## 2022-10-25 ENCOUNTER — Other Ambulatory Visit (HOSPITAL_COMMUNITY): Payer: Self-pay

## 2022-10-26 ENCOUNTER — Encounter: Payer: Self-pay | Admitting: Allergy

## 2022-10-26 ENCOUNTER — Ambulatory Visit (INDEPENDENT_AMBULATORY_CARE_PROVIDER_SITE_OTHER): Payer: Managed Care, Other (non HMO) | Admitting: Allergy

## 2022-10-26 VITALS — BP 106/72 | HR 70 | Temp 98.2°F | Resp 20 | Wt 239.0 lb

## 2022-10-26 DIAGNOSIS — R490 Dysphonia: Secondary | ICD-10-CM

## 2022-10-26 DIAGNOSIS — K1379 Other lesions of oral mucosa: Secondary | ICD-10-CM

## 2022-10-26 DIAGNOSIS — J3089 Other allergic rhinitis: Secondary | ICD-10-CM

## 2022-10-26 DIAGNOSIS — K219 Gastro-esophageal reflux disease without esophagitis: Secondary | ICD-10-CM | POA: Diagnosis not present

## 2022-10-26 DIAGNOSIS — J320 Chronic maxillary sinusitis: Secondary | ICD-10-CM

## 2022-10-26 DIAGNOSIS — J4540 Moderate persistent asthma, uncomplicated: Secondary | ICD-10-CM

## 2022-10-26 MED ORDER — CEFDINIR 300 MG PO CAPS: 300.0000 mg | ORAL_CAPSULE | Freq: Two times a day (BID) | ORAL | 0 refills | Status: DC

## 2022-10-26 NOTE — Patient Instructions (Addendum)
Possible sinus infection May take cefdinir 300mg  twice a day for 7 days if you don't feel better.  May use over the counter decongestants for a few days. You may use afrin nasal spray for a few days for severe nasal congestion.   Sinuses Refer to [facility].  Roof of your mouth There's a healing circular spot. If this keeps happening then recommend seeing maxillofacial surgeon next.   Take pictures of the facial/cheek swelling.   Asthma: Daily controller medication(s):  Take Advair 1 puff twice a day and rinse mouth after each use.  May use albuterol rescue inhaler 2 puffs every 4 to 6 hours as needed for shortness of breath, chest tightness, coughing, and wheezing. May use albuterol rescue inhaler 2 puffs 5 to 15 minutes prior to strenuous physical activities. Monitor frequency of use.  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  Continue environmental control measures as below. May take xyzal 2.5mg  daily as needed. Nasal saline spray (i.e., Simply Saline) or nasal saline lavage (i.e., NeilMed) is recommended as needed. 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   Heartburn: Continue lifestyle and dietary modifications. Continue famotidine (Pepcid) 20mg  1-2 times per day.   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   Follow up in 4 months or sooner if needed.  Follow up with your ophthalmologist.   Pet Allergen Avoidance: Contrary to popular opinion, there are no "hypoallergenic" breeds of dogs or cats. That is because people are not allergic to an animal's hair, but to an allergen found in the animal's saliva,  dander (dead skin flakes) or urine. Pet allergy symptoms typically occur within minutes. For some people, symptoms can build up and become most severe 8 to 12 hours after contact with the animal. People with severe allergies can experience reactions in public places if dander has been transported on the pet owners' clothing. Keeping an animal outdoors is only a partial solution, since homes with pets in the yard still have higher concentrations of animal allergens. Before getting a pet, ask your allergist to determine if you are allergic to animals. If your pet is already considered part of your family, try to minimize contact and keep the pet out of the bedroom and other rooms where you spend a great deal of time. As with dust mites, vacuum carpets often or replace carpet with a hardwood floor, tile or linoleum. High-efficiency particulate air (HEPA) cleaners can reduce allergen levels over time. While dander and saliva are the source of cat and dog allergens, urine is the source of allergens from rabbits, hamsters, mice and [place] pigs; so ask a non-allergic family member to clean the animal's cage. If you have a pet allergy, talk to your allergist about the potential for allergy immunotherapy (allergy shots). This strategy can often provide long-term relief.

## 2022-10-26 NOTE — Progress Notes (Signed)
Follow Up Note  RE: Cathy Baxter MRN: [id number] DOB: March 25, 1960 Date of Office Visit: 10/26/2022  Referring provider: Natalia Leatherwood, DO Primary care provider: Natalia Leatherwood, DO  Chief Complaint: Sinus Problem (Bleeding from the roof of her mouth occurs when her sinus are flares. Redness around the face. Feels like her face is stopped up. Symptoms started Sunday. )  History of Present Illness: I had the pleasure of seeing Cathy Baxter for a follow up visit at the [facility] on 10/26/2022. She is a 62 y.o. female, who is being followed for allergic rhinitis, asthma, voice hoarseness, GERD. Her previous allergy office visit was on 04/08/2022 with Dr. Selena Batten. Today is a new complaint visit of sinus issues . Failed to follow up as recommended.  Discussed the use of AI scribe software for clinical note transcription with the patient, who gave verbal consent to proceed.  The patient presents with recent oral bleeding from the roof of her mouth. This occurred on Sunday, described as blood 'pouring' from the roof of the mouth, filling two large paper towels. The patient denies any trauma or consumption of crunchy food prior to the bleeding. She reports a sensation of something sharp in the sinus area, which she believes may be a remnant from the previous sinus surgery.  The patient also reports chronic sinus issues, including facial redness and nasal congestion, which she has been managing with saline nasal spray and occasional use of an allergy nasal spray. She has been taking half a tablet of levocetirizine for allergies due to its sedative effects.  The patient has a history of hoarseness, which has not changed significantly since the last visit. She has been using an inhaler (Advair) as prescribed, except for the past two days due to throat discomfort.  Discussed that I received a letter from her insurance about compliance regarding her medications and she states that she  has been filling her meds and not sure what that was about.   The patient also reports a history of heartburn, managed with famotidine.  The patient underwent laparoscopic knee surgery. She has declined speech therapy for her hoarse voice in the past.  She has a history of glaucoma and has been advised against frequent use of steroids.   Assessment and Plan: Cathy Baxter is a 62 y.o. female with: Chronic maxillary sinusitis Ongoing sinus issues with nasal congestion and facial redness. Previous sinus surgery 20 years ago. Patient reports feeling a sharp sensation in the sinus area. No fevers/drainage. Prescribed Cefdinir and advise patient to start if symptoms do not improve in a few days. Refer to a different ENT for further evaluation. May use over the counter decongestants for a few days. You may use afrin nasal spray for a few days for severe nasal congestion.   Oral bleeding Sudden onset of oral bleeding from the roof of the mouth, possibly related to a ruptured cyst or sore. No recent trauma or dietary changes noted. There's a healing circular spot on the roof of the mouth. See ENT first and if this keeps happening then recommend seeing maxillofacial surgeon next.   Moderate persistent asthma without complication Past history - Issues with her breathing for 20+ years.  Currently on Advair 250 mcg 1 puff twice a day x1 year and albuterol as needed with good benefit.  [name] in the past.  Using albuterol less than once per week.  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  Takes Nexium as  needed for reflux.  Seen cardiology as well. Interim history - No recent exacerbations. Patient states she has been using her Advair daily. Today's spirometry was normal. Daily controller medication(s):  Take Advair 1 puff twice a day and rinse mouth after each use.  May use albuterol rescue inhaler 2 puffs every 4 to 6 hours as needed for shortness of breath, chest tightness,  coughing, and wheezing. May use albuterol rescue inhaler 2 puffs 5 to 15 minutes prior to strenuous physical activities. Monitor frequency of use.   Other allergic rhinitis Past history - Perennial rhinitis symptoms with no specific triggers. 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  Skin testing in 2014 was positive to dust mites, cat, dog, horse, feathers, grass, trees, pigweed, mold.  She was on allergy immunotherapy for less than 1 year.  She also had sinus surgery in 2000. 2022 skin testing positive to cats, dogs.  Negative to common foods. No pets at home.  ENT evaluation (no polyps, empty nose syndrome). Interim history - sinus congestion.  Continue environmental control measures as below. May take xyzal 2.5mg  daily as needed - full doses causes drowsiness.  Nasal saline spray (i.e., Simply Saline) or nasal saline lavage (i.e., NeilMed) is recommended as needed. 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   Gastroesophageal reflux disease, unspecified whether esophagitis present Continue lifestyle and dietary modifications. Continue famotidine (Pepcid) 20mg  1-2 times per day.   Voice hoarseness Past history - Having issues with voice hoarseness and not sure when this started. No change with PPI. Interim history - Saw ENT but declined speech therapy.  Follow up with your ophthalmologist.  Return in about 4 months (around 02/26/2023).  Meds ordered this encounter  Medications   cefdinir (OMNICEF) 300 MG capsule    Sig: Take 1 capsule (300 mg total) by mouth 2 (two) times daily.    Dispense:  14 capsule    Refill:  0   Lab Orders  No laboratory test(s) ordered today    Diagnostics: Spirometry:  Tracings reviewed. Her effort: Good reproducible efforts. FVC: 2.44L FEV1: 1.86L, 81% predicted FEV1/FVC ratio: 76% Interpretation: Spirometry consistent with normal pattern.  Please see scanned spirometry results for details.  Medication List:  Current Outpatient Medications   Medication Sig Dispense Refill   albuterol (VENTOLIN HFA) 108 (90 Base) MCG/ACT inhaler Inhale 2 puffs into the lungs every 4 (four) hours as needed for wheezing or shortness of breath (coughing fits). 18 g 1   brimonidine (ALPHAGAN) 0.2 % ophthalmic solution Place 1 drop into both eyes 2 (two) times daily.     cefdinir (OMNICEF) 300 MG capsule Take 1 capsule (300 mg total) by mouth 2 (two) times daily. 14 capsule 0   Cholecalciferol (VITAMIN D3) 75 MCG (3000 UT) TABS Take 3,000 Units by mouth daily.     dorzolamide-timolol (COSOPT) 22.3-6.8 MG/ML ophthalmic solution Place 1 drop into both eyes 2 (two) times daily.     esomeprazole (NEXIUM) 20 MG capsule Take 20 mg by mouth daily at 12 noon.     ezetimibe (ZETIA) 10 MG tablet Take 10 mg by mouth daily.     famotidine (PEPCID) 20 MG tablet Take 1 tablet (20 mg total) by mouth 2 (two) times daily. (Patient taking differently: Take 20 mg by mouth 2 (two) times daily as needed for heartburn.) 60 tablet 5   fexofenadine (ALLEGRA) 180 MG tablet Take 180 mg by mouth daily as needed for allergies or rhinitis.     fluticasone-salmeterol (ADVAIR) 250-50  MCG/ACT AEPB Inhale 1 puff into the lungs in the morning and at bedtime. Rinse mouth after each use. 60 each 5   levocetirizine (XYZAL) 5 MG tablet Take 0.5 tablets (2.5 mg total) by mouth every evening. (Patient taking differently: Take 2.5 mg by mouth daily as needed for allergies.) 30 tablet 5   levothyroxine (SYNTHROID) 25 MCG tablet Take 1 tablet (25 mcg total) by mouth daily before breakfast. 90 tablet 3   meloxicam (MOBIC) 7.5 MG tablet TAKE 1 TABLET BY MOUTH ONCE DAILY AS NEEDED FOR PAIN 14 tablet 0   Pitavastatin Calcium 4 MG TABS Take 1 tablet by mouth daily.     sodium chloride (OCEAN) 0.65 % SOLN nasal spray Place 1 spray into both nostrils as needed for congestion.     vitamin B-12 (CYANOCOBALAMIN) 1000 MCG tablet Take 1,000 mcg by mouth daily.     WEGOVY 0.25 MG/0.5ML SOAJ Inject into the skin.      No current facility-administered medications for this visit.   Allergies: Allergies  Allergen Reactions   Azithromycin Palpitations    Arrhythmia side effects   Oxycodone Shortness Of Breath   Penicillins Anaphylaxis and Itching   Amoxicillin Hives    Has patient had a PCN reaction causing immediate rash, facial/tongue/throat swelling, SOB or lightheadedness with hypotension: Yes Has patient had a PCN reaction causing severe rash involving mucus membranes or skin necrosis: No Has patient had a PCN reaction that required hospitalization No Has patient had a PCN reaction occurring within the last 10 years: Yes  If all of the above answers are "NO", then may proceed with Cephalosporin use.    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    Demerol [Meperidine] Palpitations   I reviewed her past medical history, social history, family history, and environmental history and no significant changes have been reported from her previous visit.  Review of Systems  Constitutional:  Negative for chills, fever and unexpected weight change.  HENT:  Positive for congestion, sinus pressure and voice change. Negative for rhinorrhea.   Eyes:  Negative for itching.  Respiratory:  Negative for cough, chest tightness and shortness of breath.   Cardiovascular:  Negative for chest pain.  Gastrointestinal:  Negative for abdominal pain.  Genitourinary:  Negative for difficulty urinating.  Skin:  Negative for rash.  Allergic/Immunologic: Positive for environmental allergies.    Objective: BP 106/72   Pulse 70   Temp 98.2 F (36.8 C)   Resp 20   Wt 239 lb (108.4 kg)   SpO2 94%   BMI 42.34 kg/m  Body mass index is 42.34 kg/m. Physical Exam Vitals and nursing note reviewed.  Constitutional:      Appearance: Normal appearance. She is well-developed.  HENT:     Head: Normocephalic and atraumatic.     Right Ear: Tympanic membrane and external ear normal.     Left Ear:  Tympanic membrane and external ear normal.     Nose: Nose normal.     Mouth/Throat:     Mouth: Mucous membranes are moist.     Pharynx: Oropharynx is clear.  Eyes:     Conjunctiva/sclera: Conjunctivae normal.  Cardiovascular:     Rate and Rhythm: Normal rate and regular rhythm.     Heart sounds: Normal heart sounds. No murmur heard. Pulmonary:     Effort: Pulmonary effort is normal.     Breath sounds: Normal breath sounds. No wheezing, rhonchi or rales.  Musculoskeletal:  Cervical back: Neck supple.  Skin:    General: Skin is warm.     Findings: No rash.  Neurological:     Mental Status: She is alert and oriented to person, place, and time.  Psychiatric:        Behavior: Behavior normal.    Previous notes and tests were reviewed. The plan was reviewed with the patient/family, and all questions/concerned were addressed.  It was my pleasure to see Joyanne today and participate in her care. Please feel free to contact me with any questions or concerns.  Sincerely,  Wyline Mood, DO Allergy & Immunology  [facility] office: [phone] [facility] office: [phone]

## 2022-10-29 ENCOUNTER — Other Ambulatory Visit (HOSPITAL_COMMUNITY): Payer: Self-pay

## 2022-11-01 ENCOUNTER — Ambulatory Visit (INDEPENDENT_AMBULATORY_CARE_PROVIDER_SITE_OTHER): Payer: Managed Care, Other (non HMO) | Admitting: Podiatry

## 2022-11-01 DIAGNOSIS — G8929 Other chronic pain: Secondary | ICD-10-CM | POA: Diagnosis not present

## 2022-11-01 DIAGNOSIS — M25571 Pain in right ankle and joints of right foot: Secondary | ICD-10-CM | POA: Diagnosis not present

## 2022-11-01 DIAGNOSIS — M76829 Posterior tibial tendinitis, unspecified leg: Secondary | ICD-10-CM | POA: Diagnosis not present

## 2022-11-01 NOTE — Progress Notes (Unsigned)
Subjective:   Patient ID: Cathy Baxter, female   DOB: 62 y.o.   MRN: [id number]   HPI No chief complaint on file.    62 year old female presents the office for follow-up evaluation of ankle pain.  She did have to reschedule her vein specialist appointment.  She recently went knee surgery as well and she is doing well with this.  She still gets pain mostly to her ankle.  She has no other concerns today.       Objective:  Physical Exam  General: AAO x3, NAD  Dermatological: Skin is warm, dry and supple bilateral. There are no open sores, no preulcerative lesions, no rash or signs of infection present.  Vascular: Dorsalis Pedis artery and Posterior Tibial artery pedal pulses are 2/4 bilateral with immedate capillary fill time. There is no pain with calf compression, swelling, warmth, erythema.  Calf is supple.  Neruologic: Grossly intact via light touch bilateral.  Negative Tinel sign.  Musculoskeletal: Decreased arch upon weightbearing.  There is some tenderness along the medial aspect of the foot just distal to the navicular tuberosity and along the navicular tuberosity.  Mild tenderness palpation of this area on the distal portion of posterior tibial, flexor tendons.  Clinically the tendons are intact.  MMT 5/5.  Overall her symptoms are unchanged.   Gait: Unassisted, Nonantalgic.       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  At this point since she is tolerating discomfort I would order an MRI.  Continue shoes/good support.  She has had ongoing issues for several months. I have treated her on 05/27/22, 07/08/22 and 08/30/22 without improvement.   No follow-ups on file.  Vivi Barrack DPM

## 2022-11-02 ENCOUNTER — Telehealth: Payer: Self-pay

## 2022-11-02 NOTE — Telephone Encounter (Signed)
-----   Message from [facility] sent at 10/27/2022  9:50 AM EDT -----  ----- Message ----- From: Ellamae Sia, DO Sent: 10/26/2022   5:22 PM EDT To: [facility]  Please place referral to [facility] - chronic sinusitis s/p sinus surgery

## 2022-11-02 NOTE — Telephone Encounter (Signed)
Referral has been placed. Patient updated Via MyChart.  Thanks

## 2022-11-03 ENCOUNTER — Encounter (INDEPENDENT_AMBULATORY_CARE_PROVIDER_SITE_OTHER): Payer: Self-pay | Admitting: Otolaryngology

## 2022-11-04 ENCOUNTER — Ambulatory Visit (INDEPENDENT_AMBULATORY_CARE_PROVIDER_SITE_OTHER): Payer: Managed Care, Other (non HMO) | Admitting: Physician Assistant

## 2022-11-04 ENCOUNTER — Encounter: Payer: Self-pay | Admitting: Physician Assistant

## 2022-11-04 DIAGNOSIS — Z9889 Other specified postprocedural states: Secondary | ICD-10-CM

## 2022-11-04 NOTE — Progress Notes (Signed)
Patient scheduled to see Dr. Suszanne Conners on 11/22/2022 @ 2:30 P.M.

## 2022-11-04 NOTE — Progress Notes (Signed)
HPI: Cathy Baxter comes in today status post right knee arthroscopy 09/16/2022.  She states the knee is doing better.  Still gives way at times.  Ranks pain to be 4-5 out of 10 pain at worst.  Most pain is medial aspect of the knee.  This where she had significant meniscal tear.  She also was found to have significant cartilage wear Tello femoral joint with moderate cartilage wear in the medial compartment of the knee.  She has been taking Advil gelcaps and on other days taking Mobic.  Review of systems: See HPI otherwise negative  Physical exam: General: Well-developed well-nourished female no acute distress mood and affect appropriate ambulates without any assistive device. Right knee: Port sites healing well no signs of infection or dehiscence.  Good range of motion right knee.  Patellofemoral crepitus with passive range of motion.   Impression: Status post right knee arthroscopy with partial medial meniscectomy Right knee osteoarthritis  Plan: Recommend she continue work on range of motion and strengthening.  Discussed knee friendly exercises with her.  Follow-up as needed.  She is open minded to had a cortisone injection but does not wish to have any viscosupplementation injections as this causes severe pain in her knee the last 1 she had.  Questions were encouraged and answered

## 2022-11-11 ENCOUNTER — Telehealth: Payer: Self-pay

## 2022-11-11 NOTE — Telephone Encounter (Signed)
Patient's MRI was denied because the notes do not indicate xray results that support need for further imaging. A P2P can be done by calling [phone].  Ref code [id number].

## 2022-11-15 ENCOUNTER — Other Ambulatory Visit (HOSPITAL_COMMUNITY): Payer: Self-pay

## 2022-11-19 NOTE — Telephone Encounter (Signed)
Pharmacy Patient Advocate Encounter  Received notification from [facility] that Prior Authorization for wegovy has been DENIED.  Full denial letter will be uploaded to the media tab. See denial reason below.   PA #/Case ID/Reference #: attached media

## 2022-11-22 ENCOUNTER — Ambulatory Visit (INDEPENDENT_AMBULATORY_CARE_PROVIDER_SITE_OTHER): Payer: Managed Care, Other (non HMO) | Admitting: Otolaryngology

## 2022-11-22 ENCOUNTER — Encounter (INDEPENDENT_AMBULATORY_CARE_PROVIDER_SITE_OTHER): Payer: Self-pay

## 2022-11-22 VITALS — Ht 63.0 in | Wt 237.0 lb

## 2022-11-22 DIAGNOSIS — J3489 Other specified disorders of nose and nasal sinuses: Secondary | ICD-10-CM | POA: Diagnosis not present

## 2022-11-22 DIAGNOSIS — J310 Chronic rhinitis: Secondary | ICD-10-CM | POA: Diagnosis not present

## 2022-11-22 DIAGNOSIS — H608X3 Other otitis externa, bilateral: Secondary | ICD-10-CM | POA: Diagnosis not present

## 2022-11-23 DIAGNOSIS — H608X3 Other otitis externa, bilateral: Secondary | ICD-10-CM | POA: Insufficient documentation

## 2022-11-23 DIAGNOSIS — J310 Chronic rhinitis: Secondary | ICD-10-CM | POA: Insufficient documentation

## 2022-11-23 DIAGNOSIS — J3489 Other specified disorders of nose and nasal sinuses: Secondary | ICD-10-CM | POA: Insufficient documentation

## 2022-11-23 NOTE — Progress Notes (Signed)
Patient ID: Cathy Baxter, female   DOB: 1960/04/21, 62 y.o.   MRN: [id number]  Follow-up: Chronic nasal and sinus congestion, empty nose syndrome, chronic eczematous otitis externa  HPI: The patient is a 62 year old female who returns today for her follow-up evaluation.  She was previously seen for chronic nasal and sinus congestion and chronic eczematous otitis externa.  She was noted to have possible empty nose syndrome.  Her inferior and middle turbinates were missing secondary to her previous surgeries.  She was treated with nasal saline irrigation.  The patient returns today complaining of occasional irritation of her nasal cavities.  She recently had a bleeding episode from her roof of mouth, which she suspects is from her sinus cavities.  She is still experiencing frequent itchy sensation in her ears.  Currently she denies any facial pain, fever, visual change, otalgia, or otorrhea.  Exam: General: Communicates without difficulty, well nourished, no acute distress. Head: Normocephalic, no evidence injury, no tenderness, facial buttresses intact without stepoff. Eyes: PERRL, EOMI. No scleral icterus, conjunctivae clear. Neuro: CN II exam reveals vision grossly intact.  No nystagmus at any point of gaze. Ears: Auricles well formed without lesions.  Ear canals are eczematous .  No erythema or edema is appreciated.  The TMs are intact without fluid. Nose: External evaluation reveals normal support and skin without lesions.  Dorsum is intact.  Anterior rhinoscopy reveals congested mucosa over anterior aspect of the inferior turbinates and nasal septum.  No purulence is noted. Middle meatus is not well visualized. Oral:  Oral cavity and oropharynx are intact, symmetric, without erythema or edema.  Mucosa is moist without lesions.  No fistula is noted.  Neck: Full range of motion without pain.  There is no significant lymphadenopathy.  No masses palpable.  Thyroid bed within normal limits to palpation.   Parotid glands and submandibular glands equal bilaterally without mass.  Trachea is midline. Neuro:  CN 2-12 grossly intact. Gait normal. Vestibular: No nystagmus at any point of gaze.   Procedure: Flexible Nasal Endoscopy: Description: Risks, benefits, and alternatives of flexible endoscopy were explained to the patient. Specific mention was made of the risk of throat numbness with difficulty swallowing, possible bleeding from the nose and mouth, and pain from the procedure. The patient gave oral consent to proceed. The flexible scope was inserted into the right nasal cavity. Endoscopy of the interior nasal cavity, superior, inferior, and middle meatus was performed. The sphenoid-ethmoid recess was examined. Normal mucosa was noted. Sinus openings patent. No polyp, mass, or lesion was appreciated. Olfactory cleft was clear. Nasopharynx was clear. Inferior and middle turbinates were missing. The procedure was repeated on the contralateral side with similar findings. The patient tolerated the procedure well.  Assessment: 1. The patient's sinus openings are patent from her previous surgery.  No purulent drainage, polyps, or other suspicious mass or lesion is noted on today's nasal endoscopy. 2. 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  3.  Bilateral chronic eczematous otitis externa.  4.  No oral fistula is noted today.  Plan: 1. The physical exam and nasal endoscopy findings are reviewed with the patient.  2. The patient is instructed to perform daily nasal saline irrigation. The instructions on how to perform the irrigation are reviewed with the patient.  3. Elocon cream to external ear once daily prn itching.  4. The patient is encouraged to call with any questions or concerns.

## 2022-11-24 ENCOUNTER — Other Ambulatory Visit: Payer: Managed Care, Other (non HMO)

## 2022-11-25 ENCOUNTER — Ambulatory Visit
Admission: RE | Admit: 2022-11-25 | Discharge: 2022-11-25 | Disposition: A | Discharge status: Home / Self Care | Payer: Managed Care, Other (non HMO) | Source: Ambulatory Visit | Attending: Podiatry | Admitting: Podiatry

## 2022-11-25 DIAGNOSIS — G8929 Other chronic pain: Secondary | ICD-10-CM

## 2022-11-25 DIAGNOSIS — M76829 Posterior tibial tendinitis, unspecified leg: Secondary | ICD-10-CM

## 2022-12-24 ENCOUNTER — Other Ambulatory Visit: Payer: Self-pay | Admitting: Cardiology

## 2022-12-24 ENCOUNTER — Other Ambulatory Visit: Payer: Self-pay | Admitting: Podiatry

## 2023-02-21 NOTE — Progress Notes (Deleted)
 Follow Up Note  RE: Cathy Baxter MRN: [id number] DOB: 05-10-1960 Date of Office Visit: 02/22/2023  Referring provider: Natalia Leatherwood, DO Primary care provider: Natalia Leatherwood, DO  Chief Complaint: No chief complaint on file.  History of Present Illness: I had the pleasure of seeing Cathy Baxter for a follow up visit at the [facility] on 02/21/2023. She is a 63 y.o. female, who is being followed for chronic sinusitis, asthma, allergic rhinitis, GERD, voice hoarseness. Her previous allergy office visit was on 10/26/2022 with Dr. Selena Batten. Today is a regular follow up visit.  Discussed the use of AI scribe software for clinical note transcription with the patient, who gave verbal consent to proceed.  History of Present Illness            ***  Assessment and Plan: [name] is a 63 y.o. female with: Chronic maxillary sinusitis Ongoing sinus issues with nasal congestion and facial redness. Previous sinus surgery 20 years ago. Patient reports feeling a sharp sensation in the sinus area. No fevers/drainage. Prescribed Cefdinir and advise patient to start if symptoms do not improve in a few days. Refer to a different ENT for further evaluation. May use over the counter decongestants for a few days. You may use afrin nasal spray for a few days for severe nasal congestion.    Oral bleeding Sudden onset of oral bleeding from the roof of the mouth, possibly related to a ruptured cyst or sore. No recent trauma or dietary changes noted. There's a healing circular spot on the roof of the mouth. See ENT first and if this keeps happening then recommend seeing maxillofacial surgeon next.    Moderate persistent asthma without complication Past history - Issues with her breathing for 20+ years.  Currently on Advair 250 mcg 1 puff twice a day x1 year and albuterol as needed with good benefit.  [name] in the past.  Using albuterol less than once per week.  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  Takes Nexium as needed for reflux.  Seen cardiology as well. Interim history - No recent exacerbations. Patient states she has been using her Advair daily. Today's spirometry was normal. Daily controller medication(s):  Take Advair 1 puff twice a day and rinse mouth after each use.  May use albuterol rescue inhaler 2 puffs every 4 to 6 hours as needed for shortness of breath, chest tightness, coughing, and wheezing. May use albuterol rescue inhaler 2 puffs 5 to 15 minutes prior to strenuous physical activities. Monitor frequency of use.    Other allergic rhinitis Past history - Perennial rhinitis symptoms with no specific triggers. 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  Skin testing in 2014 was positive to dust mites, cat, dog, horse, feathers, grass, trees, pigweed, mold.  She was on allergy immunotherapy for less than 1 year.  She also had sinus surgery in 2000. 2022 skin testing positive to cats, dogs.  Negative to common foods. No pets at home.  ENT evaluation (no polyps, empty nose syndrome). Interim history - sinus congestion.  Continue environmental control measures as below. May take xyzal 2.5mg  daily as needed - full doses causes drowsiness.  Nasal saline spray (i.e., Simply Saline) or nasal saline lavage (i.e., NeilMed) is recommended as needed. 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    Gastroesophageal reflux disease, unspecified whether esophagitis present Continue lifestyle and dietary modifications. Continue famotidine (Pepcid) 20mg  1-2 times per day.  Voice hoarseness Past history - Having issues with voice hoarseness and not sure when this started. No change with PPI. Interim history - Saw ENT but declined speech therapy.   Follow up with your ophthalmologist.  Return in about 4 months (around 02/26/2023). Assessment and Plan              No follow-ups on file.  No orders of the defined types were placed in this  encounter.  Lab Orders  No laboratory test(s) ordered today    Diagnostics: Spirometry:  Tracings reviewed. Her effort: {Blank single:19197::"Good reproducible efforts.","It was hard to get consistent efforts and there is a question as to whether this reflects a maximal maneuver.","Poor effort, data can not be interpreted."} FVC: ***L FEV1: ***L, ***% predicted FEV1/FVC ratio: ***% Interpretation: {Blank single:19197::"Spirometry consistent with mild obstructive disease","Spirometry consistent with moderate obstructive disease","Spirometry consistent with severe obstructive disease","Spirometry consistent with possible restrictive disease","Spirometry consistent with mixed obstructive and restrictive disease","Spirometry uninterpretable due to technique","Spirometry consistent with normal pattern","No overt abnormalities noted given today's efforts"}.  Please see scanned spirometry results for details.  Skin Testing: {Blank single:19197::"Select foods","Environmental allergy panel","Environmental allergy panel and select foods","Food allergy panel","None","Deferred due to recent antihistamines use"}. *** Results discussed with patient/family.   Medication List:  Current Outpatient Medications  Medication Sig Dispense Refill  . albuterol (VENTOLIN HFA) 108 (90 Base) MCG/ACT inhaler Inhale 2 puffs into the lungs every 4 (four) hours as needed for wheezing or shortness of breath (coughing fits). 18 g 1  . brimonidine (ALPHAGAN) 0.2 % ophthalmic solution Place 1 drop into both eyes 2 (two) times daily.    . cefdinir (OMNICEF) 300 MG capsule Take 1 capsule (300 mg total) by mouth 2 (two) times daily. (Patient not taking: Reported on 11/22/2022) 14 capsule 0  . Cholecalciferol (VITAMIN D3) 75 MCG (3000 UT) TABS Take 3,000 Units by mouth daily.    . dorzolamide-timolol (COSOPT) 22.3-6.8 MG/ML ophthalmic solution Place 1 drop into both eyes 2 (two) times daily.    Marland Kitchen esomeprazole (NEXIUM) 20 MG  capsule Take 20 mg by mouth daily at 12 noon.    . ezetimibe (ZETIA) 10 MG tablet Take 10 mg by mouth daily. (Patient not taking: Reported on 11/22/2022)    . famotidine (PEPCID) 20 MG tablet Take 1 tablet (20 mg total) by mouth 2 (two) times daily. (Patient taking differently: Take 20 mg by mouth 2 (two) times daily as needed for heartburn.) 60 tablet 5  . fexofenadine (ALLEGRA) 180 MG tablet Take 180 mg by mouth daily as needed for allergies or rhinitis.    . fluticasone-salmeterol (ADVAIR) 250-50 MCG/ACT AEPB Inhale 1 puff into the lungs in the morning and at bedtime. Rinse mouth after each use. 60 each 5  . levocetirizine (XYZAL) 5 MG tablet Take 0.5 tablets (2.5 mg total) by mouth every evening. (Patient not taking: Reported on 11/22/2022) 30 tablet 5  . levothyroxine (SYNTHROID) 25 MCG tablet Take 1 tablet (25 mcg total) by mouth daily before breakfast. 90 tablet 3  . meloxicam (MOBIC) 7.5 MG tablet TAKE 1 TABLET BY MOUTH ONCE DAILY AS NEEDED FOR PAIN 14 tablet 0  . Pitavastatin Calcium 4 MG TABS Take 1 tablet by mouth daily.    . sodium chloride (OCEAN) 0.65 % SOLN nasal spray Place 1 spray into both nostrils as needed for congestion.    . vitamin B-12 (CYANOCOBALAMIN) 1000 MCG tablet Take 1,000 mcg by mouth daily.    . WEGOVY 0.25 MG/0.5ML SOAJ Inject into the skin. (  Patient not taking: Reported on 11/22/2022)     No current facility-administered medications for this visit.   Allergies: Allergies  Allergen Reactions  . Azithromycin Palpitations    Arrhythmia side effects  . Oxycodone Shortness Of Breath  . Penicillins Anaphylaxis and Itching  . Amoxicillin Hives    Has patient had a PCN reaction causing immediate rash, facial/tongue/throat swelling, SOB or lightheadedness with hypotension: Yes Has patient had a PCN reaction causing severe rash involving mucus membranes or skin necrosis: No Has patient had a PCN reaction that required hospitalization No Has patient had a PCN reaction  occurring within the last 10 years: Yes  If all of the above answers are "NO", then may proceed with Cephalosporin use.   Marland Kitchen Doxycycline Itching  . 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   . Demerol [Meperidine] Palpitations   I reviewed her past medical history, social history, family history, and environmental history and no significant changes have been reported from her previous visit.  Review of Systems  Constitutional:  Negative for chills, fever and unexpected weight change.  HENT:  Positive for congestion, sinus pressure and voice change. Negative for rhinorrhea.   Eyes:  Negative for itching.  Respiratory:  Negative for cough, chest tightness and shortness of breath.   Cardiovascular:  Negative for chest pain.  Gastrointestinal:  Negative for abdominal pain.  Genitourinary:  Negative for difficulty urinating.  Skin:  Negative for rash.  Allergic/Immunologic: Positive for environmental allergies.   Objective: There were no vitals taken for this visit. There is no height or weight on file to calculate BMI. Physical Exam Vitals and nursing note reviewed.  Constitutional:      Appearance: Normal appearance. She is well-developed.  HENT:     Head: Normocephalic and atraumatic.     Right Ear: Tympanic membrane and external ear normal.     Left Ear: Tympanic membrane and external ear normal.     Nose: Nose normal.     Mouth/Throat:     Mouth: Mucous membranes are moist.     Pharynx: Oropharynx is clear.  Eyes:     Conjunctiva/sclera: Conjunctivae normal.  Cardiovascular:     Rate and Rhythm: Normal rate and regular rhythm.     Heart sounds: Normal heart sounds. No murmur heard. Pulmonary:     Effort: Pulmonary effort is normal.     Breath sounds: Normal breath sounds. No wheezing, rhonchi or rales.  Musculoskeletal:     Cervical back: Neck supple.  Skin:    General: Skin is warm.     Findings: No rash.  Neurological:     Mental Status: She is alert and oriented  to person, place, and time.  Psychiatric:        Behavior: Behavior normal.  Previous notes and tests were reviewed. The plan was reviewed with the patient/family, and all questions/concerned were addressed.  It was my pleasure to see Brogan today and participate in her care. Please feel free to contact me with any questions or concerns.  Sincerely,  Wyline Mood, DO Allergy & Immunology  [facility] office: [phone] [facility] office: [phone]

## 2023-02-22 ENCOUNTER — Ambulatory Visit: Payer: Commercial Managed Care - HMO | Admitting: Allergy

## 2023-05-12 ENCOUNTER — Encounter: Payer: Self-pay | Admitting: Family Medicine

## 2023-05-12 ENCOUNTER — Ambulatory Visit (INDEPENDENT_AMBULATORY_CARE_PROVIDER_SITE_OTHER): Admitting: Family Medicine

## 2023-05-12 VITALS — BP 112/72 | HR 82 | Temp 97.7°F | Wt 207.8 lb

## 2023-05-12 DIAGNOSIS — Z23 Encounter for immunization: Secondary | ICD-10-CM

## 2023-05-12 DIAGNOSIS — Z1231 Encounter for screening mammogram for malignant neoplasm of breast: Secondary | ICD-10-CM | POA: Diagnosis not present

## 2023-05-12 DIAGNOSIS — E782 Mixed hyperlipidemia: Secondary | ICD-10-CM

## 2023-05-12 DIAGNOSIS — Z131 Encounter for screening for diabetes mellitus: Secondary | ICD-10-CM | POA: Diagnosis not present

## 2023-05-12 DIAGNOSIS — Z1211 Encounter for screening for malignant neoplasm of colon: Secondary | ICD-10-CM | POA: Diagnosis not present

## 2023-05-12 DIAGNOSIS — B029 Zoster without complications: Secondary | ICD-10-CM

## 2023-05-12 DIAGNOSIS — E063 Autoimmune thyroiditis: Secondary | ICD-10-CM | POA: Diagnosis not present

## 2023-05-12 DIAGNOSIS — Z0000 Encounter for general adult medical examination without abnormal findings: Secondary | ICD-10-CM | POA: Diagnosis not present

## 2023-05-12 DIAGNOSIS — E669 Obesity, unspecified: Secondary | ICD-10-CM | POA: Diagnosis not present

## 2023-05-12 LAB — COMPREHENSIVE METABOLIC PANEL WITH GFR
ALT: 24 U/L (ref 0–35)
AST: 19 U/L (ref 0–37)
Albumin: 4.4 g/dL (ref 3.5–5.2)
Alkaline Phosphatase: 59 U/L (ref 39–117)
BUN: 15 mg/dL (ref 6–23)
CO2: 27 meq/L (ref 19–32)
Calcium: 9.4 mg/dL (ref 8.4–10.5)
Chloride: 102 meq/L (ref 96–112)
Creatinine, Ser: 0.74 mg/dL (ref 0.40–1.20)
GFR: 86.1 mL/min (ref >60.00)
Glucose, Bld: 91 mg/dL (ref 70–99)
Potassium: 4.5 meq/L (ref 3.5–5.1)
Sodium: 137 meq/L (ref 135–145)
Total Bilirubin: 0.5 mg/dL (ref 0.2–1.2)
Total Protein: 7.4 g/dL (ref 6.0–8.3)

## 2023-05-12 LAB — CBC
HCT: 42.9 % (ref 36.0–46.0)
Hemoglobin: 14.2 g/dL (ref 12.0–15.0)
MCHC: 33.2 g/dL (ref 30.0–36.0)
MCV: 103.8 fl — ABNORMAL HIGH (ref 78.0–100.0)
Platelets: 379 10*3/uL (ref 150.0–400.0)
RBC: 4.13 Mil/uL (ref 3.87–5.11)
RDW: 12.5 % (ref 11.5–15.5)
WBC: 9.2 10*3/uL (ref 4.0–10.5)

## 2023-05-12 LAB — LIPID PANEL
Cholesterol: 277 mg/dL — ABNORMAL HIGH (ref 0–200)
HDL: 57 mg/dL (ref >39.00)
LDL Cholesterol: 191 mg/dL — ABNORMAL HIGH (ref 0–99)
NonHDL: 219.68
Total CHOL/HDL Ratio: 5
Triglycerides: 142 mg/dL (ref 0.0–149.0)
VLDL: 28.4 mg/dL (ref 0.0–40.0)

## 2023-05-12 LAB — T4, FREE: Free T4: 0.93 ng/dL (ref 0.60–1.60)

## 2023-05-12 LAB — HEMOGLOBIN A1C: Hgb A1c MFr Bld: 5.3 % (ref 4.6–6.5)

## 2023-05-12 LAB — TSH: TSH: 3.67 u[IU]/mL (ref 0.35–5.50)

## 2023-05-12 NOTE — Progress Notes (Signed)
 Patient ID: Cathy Baxter, female  DOB: 1961/01/10, 63 y.o.   MRN: [id number] Patient Care Team    Relationship Specialty Notifications Start End  Mariel Shope, DO PCP - General Family Medicine  08/30/16   Emilie Harden, MD Consulting Physician Internal Medicine  02/20/16    Comment: endocrine  Dyanna Glasgow, DO Consulting Physician Obstetrics and Gynecology  02/20/16   Arnie Lao, MD Consulting Physician Orthopedic Surgery  07/11/18   Amada Backer, MD Consulting Physician Orthopedic Surgery  02/27/20   Claiborne Crew, MD Consulting Physician Orthopedic Surgery  02/27/20   Evangeline Hilts, MD Consulting Physician Gastroenterology  05/12/23   Jerryl Morin, DO Consulting Physician Cardiology  05/12/23     Chief Complaint  Patient presents with   Annual Exam    Unsure about shingrix she had outbreak 5 yrs ago and is starting to have pain in the same area for about 1 week; right side    Subjective:  Cathy Baxter is a 63 y.o.  Female  present for CPE and .Chronic Conditions/illness Management . All past medical history, surgical history, allergies, family history, immunizations, medications and social history were updated in the electronic medical record today. All recent labs, ED visits and hospitalizations within the last year were reviewed.  Health maintenance:  Colonoscopy: completed 01/11/2013, she would like to have Cologuard testing. Mammogram: completed: 2022?  Ordered for her today consider [place],  Cervical cancer screening: last pap: 08/14/2020, results: 5-year follow-up, completed by: Gynecology Immunizations: tdap completed today, Influenza UTD 2024 (encouraged yearly), zostavax and Shingrix series discussed.  She currently has a shingles outbreak on her right buttocks.  Okay to have vaccination by nurse visit if she decides to go forward with vaccination after current outbreak resolves Infectious disease screening: HIV completed, Hep C completed DEXA: last  completed gynecology, patient reports normal.  Records requested Assistive device: None Oxygen use: None Patient has a Dental home. Hospitalizations/ED visits: Reviewed  Hypothyroidism, unspecified type/obesity Pt reports compliance of levothyroxine  25 mcg.        05/12/2022    1:44 PM 04/15/2021    3:03 PM 12/01/2020    2:52 PM 12/12/2019   10:13 AM 07/25/2018    1:41 PM  Depression screen PHQ 2/9  Decreased Interest 1 1 0 3 0  Down, Depressed, Hopeless 1 1 0 3 0  PHQ - 2 Score 2 2 0 6 0  Altered sleeping 2 1  3    Tired, decreased energy 3 3  3    Change in appetite 2 0  3   Feeling bad or failure about yourself  1 1  3    Trouble concentrating 1 2  3    Moving slowly or fidgety/restless 0 0  0   Suicidal thoughts 0 0  0   PHQ-9 Score 11 9  21        04/15/2021    3:15 PM 12/12/2019   10:15 AM  GAD 7 : Generalized Anxiety Score  Nervous, Anxious, on Edge 1 3  Control/stop worrying 1 3  Worry too much - different things 2 3  Trouble relaxing 1 3  Restless 0 0  Easily annoyed or irritable 2 3  Afraid - awful might happen 1 3  Total GAD 7 Score 8 18           Immunization History  Administered Date(s) Administered   PFIZER(Purple Top)SARS-COV-2 Vaccination 09/06/2019, 09/27/2019   Tdap 05/12/2023     Past Medical History:  Diagnosis Date  Allergic rhinitis    Arthritis    Daytime somnolence    sleep study by dr t Loetta Ringer 03-04-2022 nocturnal hypoxemia/ upper airway restristion syndrome, no cpap   Depression    Dysrhythmia    PSVT, PACs,   Fatty liver    GERD (gastroesophageal reflux disease)    History of hiatal hernia    Hyperlipidemia    Hypertension    Hypothyroidism    followed by pcp   (ultrasound in epic 05-17-2022  states hashimoto's thyroiditis)   Moderate persistent asthma    folllowed by dr y. Burdette Carolin ([facility]   Muscle tension dysphonia    voice hoarness---- ENT , dr h. hall   Pigmentary glaucoma of both eyes    Pneumonia    PSVT  (paroxysmal supraventricular tachycardia) (HCC) 2022   cardiologist--- dr Linnell Richardson. [facility];  zio monitor 03-17-2020 PSVT which is likely AT, rarely symptomic PACs;  echo 03-26-2020, normal;  cardiac CTA 03-17-2022  calcium  score=zero   Right knee meniscal tear    RLS (restless legs syndrome)    Urge urinary incontinence    Uterine fibroid    Varicose veins of both legs with edema    vascular--- dr Melven Stable. [place]   Wears partial dentures    upper   Allergies  Allergen Reactions   Azithromycin  Palpitations    Arrhythmia side effects   Oxycodone Shortness Of Breath   Penicillins Anaphylaxis and Itching   Amoxicillin Hives    Has patient had a PCN reaction causing immediate rash, facial/tongue/throat swelling, SOB or lightheadedness with hypotension: Yes Has patient had a PCN reaction causing severe rash involving mucus membranes or skin necrosis: No Has patient had a PCN reaction that required hospitalization No Has patient had a PCN reaction occurring within the last 10 years: Yes  If all of the above answers are "NO", then may proceed with Cephalosporin use.    Doxycycline Itching   Prednisone      Caution>> severe glaucoma.    Demerol [Meperidine] Palpitations   Past Surgical History:  Procedure Laterality Date   CATARACT EXTRACTION W/ INTRAOCULAR LENS IMPLANT Bilateral 2020   staged same year each eye,  glaucoma surgery w/ stent insertion   CHOLECYSTECTOMY, LAPAROSCOPIC  2002   COLONOSCOPY  2014   ESOPHAGOGASTRODUODENOSCOPY  10/04/2017   dr outlaw   HYSTEROSCOPY WITH D & C N/A 11/13/2020   Procedure: DILATATION AND CURETTAGE /HYSTEROSCOPY;  Surgeon: Dyanna Glasgow, DO;  Location: [facility] OR;  Service: Gynecology;  Laterality: N/A;  rep Antony Baumgartner to cover case confirmed on 11/10/20 CS   KNEE ARTHROSCOPY Left 01/26/2018   @[facility]  by dr c. Lucienne Ryder   KNEE ARTHROSCOPY Right 09/16/2022   Procedure: RIGHT KNEE ARTHROSCOPY WITH PARTIAL MEDIAL MENISCECTOMY;  Surgeon: Arnie Lao, MD;  Location:  [facility];  Service: Orthopedics;  Laterality: Right;   NASAL SINUS SURGERY  2000   Family History  Problem Relation Age of Onset   Heart disease Mother    Asthma Mother    COPD Mother    Early death Father    Cancer Son 71       Neuroepithelioma    Breast cancer Sister 77   Celiac disease Daughter    Allergic rhinitis Neg Hx    Eczema Neg Hx    Urticaria Neg Hx    Angioedema Neg Hx    Immunodeficiency Neg Hx    Atopy Neg Hx    Social History   Social History Narrative   Divorced. She has had 3  children, one passed away young from cancer.    11th grade education. Works at [employer].    Former smoker.   Drinks caffeine.    Wears seatbelt, smoke detector in the home.    Firearms in the home.    Feels safe in her relationships.     Allergies as of 05/12/2023       Reactions   Azithromycin  Palpitations   Arrhythmia side effects   Oxycodone Shortness Of Breath   Penicillins Anaphylaxis, Itching   Amoxicillin Hives   Has patient had a PCN reaction causing immediate rash, facial/tongue/throat swelling, SOB or lightheadedness with hypotension: Yes Has patient had a PCN reaction causing severe rash involving mucus membranes or skin necrosis: No Has patient had a PCN reaction that required hospitalization No Has patient had a PCN reaction occurring within the last 10 years: Yes  If all of the above answers are "NO", then may proceed with Cephalosporin use.   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    Demerol [meperidine] Palpitations        Medication List        Accurate as of May 12, 2023  3:36 PM. If you have any questions, ask your nurse or doctor.          STOP taking these medications    cefdinir  300 MG capsule Commonly known as: OMNICEF  Stopped by: Michaeljohn Biss   cyanocobalamin 1000 MCG tablet Commonly known as: VITAMIN B12 Stopped by: Napolean Backbone   ezetimibe  10 MG tablet Commonly known as: ZETIA  Stopped by: Napolean Backbone    fexofenadine  180 MG tablet Commonly known as: ALLEGRA  Stopped by: Napolean Backbone   levocetirizine 5 MG tablet Commonly known as: XYZAL  Stopped by: Napolean Backbone   meloxicam  7.5 MG tablet Commonly known as: MOBIC  Stopped by: Shira Bobst   Pitavastatin  Calcium  4 MG Tabs Stopped by: Napolean Backbone   sodium chloride  0.65 % Soln nasal spray Commonly known as: OCEAN Stopped by: Napolean Backbone   Wegovy  0.25 MG/0.5ML Soaj Generic drug: Semaglutide -Weight Management Stopped by: Napolean Backbone       TAKE these medications    albuterol  108 (90 Base) MCG/ACT inhaler Commonly known as: Ventolin  HFA Inhale 2 puffs into the lungs every 4 (four) hours as needed for wheezing or shortness of breath (coughing fits).   brimonidine 0.2 % ophthalmic solution Commonly known as: ALPHAGAN Place 1 drop into both eyes 2 (two) times daily.   dorzolamide-timolol 2-0.5 % ophthalmic solution Commonly known as: COSOPT Place 1 drop into both eyes 2 (two) times daily.   esomeprazole 20 MG capsule Commonly known as: NEXIUM Take 20 mg by mouth daily at 12 noon.   famotidine  20 MG tablet Commonly known as: PEPCID  Take 1 tablet (20 mg total) by mouth 2 (two) times daily. What changed:  when to take this reasons to take this   fluticasone -salmeterol 250-50 MCG/ACT Aepb Commonly known as: ADVAIR Inhale 1 puff into the lungs in the morning and at bedtime. Rinse mouth after each use.   levothyroxine  25 MCG tablet Commonly known as: SYNTHROID  Take 1 tablet (25 mcg total) by mouth daily before breakfast.   Vitamin D3 75 MCG (3000 UT) Tabs Take 3,000 Units by mouth daily.        All past medical history, surgical history, allergies, family history, immunizations andmedications were updated in the EMR today and reviewed under the history and medication portions of their EMR.     No results found  for this or any previous visit (from the past 2160 hours).  MR ANKLE RIGHT WO CONTRAST Result Date:  12/14/2022 CLINICAL DATA:  Right ankle pain EXAM: MRI OF THE RIGHT ANKLE WITHOUT CONTRAST TECHNIQUE: Multiplanar, multisequence MR imaging of the ankle was performed. No intravenous contrast was administered. 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 Posteromedial: Distal tibialis posterior tendinopathy, correlate clinically in assessing for tibialis posterior dysfunction. Anterior: Unremarkable Achilles: Unremarkable Plantar Fascia: No substantial plantar fascial thickening. Plantar calcaneal spur noted. LIGAMENTS Lateral: Thin but intact anterior talofibular ligament. Lateral ligamentous complex otherwise appears normal. Medial: Mild thickening of the superomedial portion of the spring ligament. Deltoid ligament intact. CARTILAGE Ankle Joint: Unremarkable Subtalar Joints/Sinus Tarsi: Unremarkable Bones: Degenerative arthropathy between the navicular and the medial cuneiform, and also distally in the medial portion of the middle cuneiform along the Lisfranc joint. Mild degenerative findings at the articulation of the cuboid in the base of the fourth metatarsal. Dorsal midfoot spurring. Other: Subcutaneous edema overlying the lateral malleolus. IMPRESSION: 1. 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 2. Distal tibialis posterior tendinopathy, correlate clinically in assessing for tibialis posterior dysfunction. 3. Thin but intact anterior talofibular ligament. 4. Mild thickening of the superomedial portion of the spring ligament. 5. Degenerative arthropathy between the navicular and the medial cuneiform, and also distally in the medial portion of the middle cuneiform along the Lisfranc joint. Mild degenerative findings at the articulation of the cuboid in  the base of the fourth metatarsal. 6. Subcutaneous edema overlying the lateral malleolus. Electronically Signed   By: Freida Jes M.D.   On: 12/14/2022 16:33     ROS 14 pt review of systems performed and negative (unless mentioned in an HPI)  Objective: BP 112/72   Pulse 82   Temp 97.7 F (36.5 C)   Wt 207 lb 12.8 oz (94.3 kg)   SpO2 98%   BMI 36.81 kg/m  Physical Exam Vitals and nursing note reviewed.  Constitutional:      General: She is not in acute distress.    Appearance: Normal appearance. She is obese. She is not ill-appearing or toxic-appearing.  HENT:     Head: Normocephalic and atraumatic.     Right Ear: Tympanic membrane, ear canal and external ear normal. There is no impacted cerumen.     Left Ear: Tympanic membrane, ear canal and external ear normal. There is no impacted cerumen.     Nose: No congestion or rhinorrhea.     Mouth/Throat:     Mouth: Mucous membranes are moist.     Pharynx: Oropharynx is clear. No oropharyngeal exudate or posterior oropharyngeal erythema.  Eyes:     General: No scleral icterus.       Right eye: No discharge.        Left eye: No discharge.     Extraocular Movements: Extraocular movements intact.     Conjunctiva/sclera: Conjunctivae normal.     Pupils: Pupils are equal, round, and reactive to light.  Cardiovascular:     Rate and Rhythm: Normal rate and regular rhythm.     Pulses: Normal pulses.     Heart sounds: Normal heart sounds. No murmur heard.    No friction rub. No gallop.  Pulmonary:     Effort: Pulmonary effort is normal. No respiratory distress.     Breath  sounds: Normal breath sounds. No stridor. No wheezing, rhonchi or rales.  Chest:     Chest wall: No tenderness.  Abdominal:     General: Abdomen is flat. Bowel sounds are normal. There is no distension.     Palpations: Abdomen is soft. There is no mass.     Tenderness: There is no abdominal tenderness. There is no right CVA tenderness, left CVA tenderness,  guarding or rebound.     Hernia: No hernia is present.  Musculoskeletal:        General: No swelling, tenderness or deformity. Normal range of motion.     Cervical back: Normal range of motion and neck supple. No rigidity or tenderness.     Right lower leg: No edema.     Left lower leg: No edema.  Lymphadenopathy:     Cervical: No cervical adenopathy.  Skin:    General: Skin is warm and dry.     Coloration: Skin is not jaundiced or pale.     Findings: No bruising, erythema, lesion or rash.  Neurological:     General: No focal deficit present.     Mental Status: She is alert and oriented to person, place, and time. Mental status is at baseline.     Cranial Nerves: No cranial nerve deficit.     Sensory: No sensory deficit.     Motor: No weakness.     Coordination: Coordination normal.     Gait: Gait normal.     Deep Tendon Reflexes: Reflexes normal.  Psychiatric:        Mood and Affect: Mood normal.        Behavior: Behavior normal.        Thought Content: Thought content normal.        Judgment: Judgment normal.        No results found.  Assessment/plan: Cathy Baxter is a 63 y.o. female present for CPE and chronic condition management Routine general medical examination at a health care facility (Primary) - CBC - Comprehensive metabolic panel with GFR - TSH Patient was encouraged to exercise greater than 150 minutes a week. Patient was encouraged to choose a diet filled with fresh fruits and vegetables, and lean meats. AVS provided to patient today for education/recommendation on gender specific health and safety maintenance. Colonoscopy: completed 01/11/2013, she would like to have Cologuard testing. Mammogram: completed: 2022?  Ordered for her today consider [place],  Cervical cancer screening: last pap: 08/14/2020, results: 5-year follow-up, completed by: Gynecology Immunizations: tdap completed today, Influenza UTD 2024 (encouraged yearly), zostavax and Shingrix series  discussed.  She currently has a shingles outbreak on her right buttocks.  Okay to have vaccination by nurse visit if she decides to go forward with vaccination after current outbreak resolves Infectious disease screening: HIV completed, Hep C completed DEXA: last completed gynecology, patient reports normal.  Records requested  Breast cancer screening by mammogram - MM 3D SCREENING MAMMOGRAM BILATERAL BREAST; Future Colon cancer screening - Cologuard Need for Tdap vaccination Completed today Need for zoster vaccination Encouraged her to consider having series completed after acute shingles outbreak has resolved. Okay to schedule by nurse visit  Need for vaccination for pneumococcus Declined  Mixed hyperlipidemia Reports not taking Zetia  - Hemoglobin A1c - Lipid panel  Hypothyroidism due to Hashimoto thyroiditis Continue levothyroxine  25 mcg daily.  Refills will be provided after results received and appropriate dose. - Hemoglobin A1c - TSH - T4, free  Obesity (BMI 30-39.9)/diabetes screening - Hemoglobin A1c  Herpes zoster  without complication Discussed over-the-counter lidocaine  creams comfort.  She has had this particular outbreak for greater than 10 days and it appears last week to healing.  Antivirals or steroids would not be beneficial this late in the process.  Patient reports understanding   Return in about 1 year (around 05/12/2024) for cpe (20 min), Routine chronic condition follow-up.    Orders Placed This Encounter  Procedures   MM 3D SCREENING MAMMOGRAM BILATERAL BREAST   Tdap vaccine greater than or equal to 7yo IM   CBC   Comprehensive metabolic panel with GFR   Hemoglobin A1c   TSH   Lipid panel   T4, free   Cologuard   No orders of the defined types were placed in this encounter.  Referral Orders  No referral(s) requested today     Electronically signed by: Napolean Backbone, DO [facility]

## 2023-05-12 NOTE — Patient Instructions (Addendum)

## 2023-05-13 ENCOUNTER — Other Ambulatory Visit: Payer: Self-pay | Admitting: Family Medicine

## 2023-05-13 DIAGNOSIS — E782 Mixed hyperlipidemia: Secondary | ICD-10-CM

## 2023-05-13 MED ORDER — LEVOTHYROXINE SODIUM 25 MCG PO TABS: 25.0000 ug | ORAL_TABLET | Freq: Every day | ORAL | 3 refills | Status: AC

## 2023-05-13 MED ORDER — ATORVASTATIN CALCIUM 40 MG PO TABS
40.0000 mg | ORAL_TABLET | Freq: Every evening | ORAL | 3 refills | Status: DC
Start: 2023-05-13 — End: 2023-11-15

## 2023-05-13 NOTE — Telephone Encounter (Signed)
 Please call patient Liver thyroid  and kidney functions are normal Blood cell counts and electrolytes are normal Diabetes screening/A1c is normal    Cholesterol panel is HIGH.  The bad cholesterol is 191.  The cholesterol is high we do need to start a medication to help bring down her cholesterol level.  I called in atorvastatin  40 mg to be taken every evening. Of course, daily exercise and diet high fiber, low in saturated fats will help bring down the LDL.  Mediterranean diet is a good diet to consider.  Follow-up 2-3 months fasting for repeat cholesterol levels after starting dietary changes and medication.  Please schedule patient for appointment with provider.

## 2023-05-13 NOTE — Telephone Encounter (Signed)
 Spoke with patient regarding results/recommendations.   Pt states that she is working on weight loss and was advised that the diet she is on could cause some issues. She also states that she has had side effects with statin medications before but will try them and let us  know if it works for her.

## 2023-05-13 NOTE — Addendum Note (Signed)
 Addended by: Terris Fickle D on: 05/13/2023 04:36 PM   Modules accepted: Orders

## 2023-05-13 NOTE — Telephone Encounter (Signed)
 Please advise if ok to do 1 year for Levothyroxine 

## 2023-05-13 NOTE — Telephone Encounter (Signed)
 Medication had already been.  For 1 year

## 2023-05-24 ENCOUNTER — Inpatient Hospital Stay (HOSPITAL_BASED_OUTPATIENT_CLINIC_OR_DEPARTMENT_OTHER): Admission: RE | Admit: 2023-05-24 | Source: Ambulatory Visit

## 2023-05-30 ENCOUNTER — Ambulatory Visit (HOSPITAL_BASED_OUTPATIENT_CLINIC_OR_DEPARTMENT_OTHER)
Admission: RE | Admit: 2023-05-30 | Discharge: 2023-05-30 | Disposition: A | Discharge status: Home / Self Care | Source: Ambulatory Visit | Attending: Family Medicine | Admitting: Family Medicine

## 2023-05-30 ENCOUNTER — Encounter (HOSPITAL_BASED_OUTPATIENT_CLINIC_OR_DEPARTMENT_OTHER): Payer: Self-pay

## 2023-05-30 DIAGNOSIS — Z1231 Encounter for screening mammogram for malignant neoplasm of breast: Secondary | ICD-10-CM | POA: Insufficient documentation

## 2023-06-03 ENCOUNTER — Ambulatory Visit: Payer: Self-pay | Admitting: Family Medicine

## 2023-07-05 ENCOUNTER — Telehealth: Payer: Self-pay

## 2023-07-05 NOTE — Telephone Encounter (Signed)
LVM. Sent MyChart.

## 2023-07-05 NOTE — Telephone Encounter (Signed)
 Source  Cathy Baxter (Patient)   Subject  Cathy Baxter (Patient)   Topic  Referral - Request for Referral     Communication  Did the patient discuss referral with their provider in the last year? Yes    (If No - schedule appointment)    (If Yes - send message)        Appointment offered? Yes        Type of order/referral and detailed reason for visit: Rheumatology        Preference of office, provider, location:    [facility]    [address]    [address]    [place], [place] [address]    [phone]        If referral order, have you been seen by this specialty before? Yes    (If Yes, this issue or another issue? When? Where?        Can we respond through MyChart? Yes

## 2023-08-15 ENCOUNTER — Ambulatory Visit (HOSPITAL_BASED_OUTPATIENT_CLINIC_OR_DEPARTMENT_OTHER)
Admission: RE | Admit: 2023-08-15 | Discharge: 2023-08-15 | Disposition: A | Discharge status: Home / Self Care | Source: Ambulatory Visit | Attending: Family Medicine | Admitting: Family Medicine

## 2023-08-15 ENCOUNTER — Ambulatory Visit: Admitting: Family Medicine

## 2023-08-15 ENCOUNTER — Encounter: Payer: Self-pay | Admitting: Family Medicine

## 2023-08-15 ENCOUNTER — Ambulatory Visit: Payer: Self-pay | Admitting: Family Medicine

## 2023-08-15 VITALS — BP 116/74 | HR 72 | Temp 97.9°F | Wt 200.8 lb

## 2023-08-15 DIAGNOSIS — M25512 Pain in left shoulder: Secondary | ICD-10-CM

## 2023-08-15 MED ORDER — TIZANIDINE HCL 4 MG PO TABS: 2.0000 mg | ORAL_TABLET | Freq: Two times a day (BID) | ORAL | 2 refills | Status: DC | PRN

## 2023-08-15 MED ORDER — DICLOFENAC SODIUM 75 MG PO TBEC: 75.0000 mg | DELAYED_RELEASE_TABLET | Freq: Two times a day (BID) | ORAL | 2 refills | Status: AC

## 2023-08-15 NOTE — Progress Notes (Signed)
 Cathy Baxter , Aug 02, 1960, 63 y.o., female MRN: [id number] Patient Care Team    Relationship Specialty Notifications Start End  Catherine Charlies LABOR, DO PCP - General Family Medicine  08/30/16   Trixie File, MD Consulting Physician Internal Medicine  02/20/16    Comment: endocrine  Dannielle Bouchard, DO Consulting Physician Obstetrics and Gynecology  02/20/16   Vernetta Lonni GRADE, MD Consulting Physician Orthopedic Surgery  07/11/18   Kit Rush, MD Consulting Physician Orthopedic Surgery  02/27/20   Ernie Cough, MD Consulting Physician Orthopedic Surgery  02/27/20   Burnette Fallow, MD Consulting Physician Gastroenterology  05/12/23   Sheena Pugh, DO Consulting Physician Cardiology  05/12/23     Chief Complaint  Patient presents with   Shoulder Pain    L shoulder/arm; 1 month. Causes issues with mobility. Pt has taken Ibuprofen.      Subjective: Cathy Baxter is a 63 y.o. Pt presents for an OV with complaints of left shoulder pain of 1 month duration.  Associated symptoms include difficulty with range of motion and multiple planes.  Patient reports she cannot abduct her shoulder overhead putting on when can be very difficult to help.   She denies any injury that occurred around the time of onset.  She reports it feels like it is dislocated. Denies any prior shoulder injury or surgery. She has had a right knee arthroscopy with partial medial meniscectomy by Dr. Vernetta 09/23/2022.  Pt has tried ibuprofen to ease their symptoms.  She has severe elevated pressures in her eye, dx with glaucoma.  Unable to safely prescribe steroids.     08/15/2023    1:06 PM 05/12/2022    1:44 PM 04/15/2021    3:03 PM 12/01/2020    2:52 PM 12/12/2019   10:13 AM  Depression screen PHQ 2/9  Decreased Interest 1 1 1  0 3  Down, Depressed, Hopeless 0 1 1 0 3  PHQ - 2 Score 1 2 2  0 6  Altered sleeping 0 2 1  3   Tired, decreased energy 0 3 3  3   Change in appetite 0 2 0  3  Feeling bad or failure  about yourself  0 1 1  3   Trouble concentrating 0 1 2  3   Moving slowly or fidgety/restless 0 0 0  0  Suicidal thoughts 0 0 0  0  PHQ-9 Score 1 11 9  21   Difficult doing work/chores Not difficult at all        Allergies  Allergen Reactions   Azithromycin  Palpitations    Arrhythmia side effects   Oxycodone Shortness Of Breath   Penicillins Anaphylaxis and Itching   Amoxicillin Hives    Has patient had a PCN reaction causing immediate rash, facial/tongue/throat swelling, SOB or lightheadedness with hypotension: Yes Has patient had a PCN reaction causing severe rash involving mucus membranes or skin necrosis: No Has patient had a PCN reaction that required hospitalization No Has patient had a PCN reaction occurring within the last 10 years: Yes  If all of the above answers are NO, then may proceed with Cephalosporin use.    Doxycycline Itching   Prednisone      Caution>> severe glaucoma.    Demerol [Meperidine] Palpitations   Social History   Social History Narrative   Divorced. She has had 3 children, one passed away young from cancer.    11th grade education. Works at [employer].    Former smoker.   Drinks caffeine.    Wears seatbelt,  smoke detector in the home.    Firearms in the home.    Feels safe in her relationships.    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 luke [facility]   Muscle tension dysphonia    voice hoarness---- ENT , dr h. hall   Pigmentary glaucoma of both eyes     Pneumonia    PSVT (paroxysmal supraventricular tachycardia) (HCC) 2022   cardiologist--- dr marla. tobb;  zio monitor 03-17-2020 PSVT which is likely AT, rarely symptomic PACs;  echo 03-26-2020, normal;  cardiac CTA 03-17-2022  calcium  score=zero   Right knee meniscal tear    RLS (restless legs syndrome)    Urge urinary incontinence    Uterine fibroid    Varicose veins of both legs with edema    vascular--- dr christella. [place]   Wears partial dentures    upper   Past Surgical History:  Procedure Laterality Date   CATARACT EXTRACTION W/ INTRAOCULAR LENS IMPLANT Bilateral 2020   staged same year each eye,  glaucoma surgery w/ stent insertion   CHOLECYSTECTOMY, LAPAROSCOPIC  2002   COLONOSCOPY  2014   ESOPHAGOGASTRODUODENOSCOPY  10/04/2017   dr outlaw   HYSTEROSCOPY WITH D & C N/A 11/13/2020   Procedure: DILATATION AND CURETTAGE /HYSTEROSCOPY;  Surgeon: Dannielle Bouchard, DO;  Location: [facility] OR;  Service: Gynecology;  Laterality: N/A;  rep Therisa to cover case confirmed on 11/10/20 [facility]   KNEE ARTHROSCOPY Left 01/26/2018   @[facility]  by dr c. blackman   KNEE ARTHROSCOPY Right 09/16/2022   Procedure: RIGHT KNEE ARTHROSCOPY WITH PARTIAL MEDIAL MENISCECTOMY;  Surgeon: Vernetta Lonni GRADE, MD;  Location: [facility] ORS;  Service: Orthopedics;  Laterality: Right;   NASAL SINUS SURGERY  2000   Family History  Problem Relation Age of Onset   Heart disease Mother    Asthma Mother    COPD Mother    Early death Father    Cancer Son 61       Neuroepithelioma    Breast cancer Sister 31   Celiac disease Daughter    Allergic rhinitis Neg Hx    Eczema Neg Hx    Urticaria Neg Hx    Angioedema Neg Hx    Immunodeficiency Neg Hx    Atopy Neg Hx    Allergies as of 08/15/2023       Reactions   Azithromycin  Palpitations   Arrhythmia side effects   Oxycodone Shortness Of Breath   Penicillins Anaphylaxis, Itching   Amoxicillin Hives   Has patient had a PCN reaction causing immediate rash, facial/tongue/throat swelling,  SOB or lightheadedness with hypotension: Yes Has patient had a PCN reaction causing severe rash involving mucus membranes or skin necrosis: No Has patient had a PCN reaction that required hospitalization No Has patient had a PCN reaction occurring within the last 10 years: Yes  If all of the above answers are NO, then may proceed with Cephalosporin use.  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    Demerol [meperidine] Palpitations        Medication List        Accurate as of August 15, 2023  2:58 PM. If you have any questions, ask your nurse or doctor.          albuterol  108 (90 Base) MCG/ACT inhaler Commonly known as: Ventolin  HFA Inhale 2 puffs into the lungs every 4 (four) hours as needed for wheezing or shortness of breath (coughing fits).   atorvastatin  40 MG tablet Commonly known as: LIPITOR Take 1 tablet (40 mg total) by mouth every evening.   brimonidine 0.2 % ophthalmic solution Commonly known as: ALPHAGAN Place 1 drop into both eyes 2 (two) times daily.   diclofenac  75 MG EC tablet Commonly known as: VOLTAREN  Take 1 tablet (75 mg total) by mouth 2 (two) times daily. Started by: Numan Zylstra   dorzolamide-timolol 2-0.5 % ophthalmic solution Commonly known as: COSOPT Place 1 drop into both eyes 2 (two) times daily.   esomeprazole 20 MG capsule Commonly known as: NEXIUM Take 20 mg by mouth daily at 12 noon.   famotidine  20 MG tablet Commonly known as: PEPCID  Take 1 tablet (20 mg total) by mouth 2 (two) times daily. What changed:  when to take this reasons to take this   fluticasone -salmeterol 250-50 MCG/ACT Aepb Commonly known as: ADVAIR Inhale 1 puff into the lungs in the morning and at bedtime. Rinse mouth after each use.   levothyroxine  25 MCG tablet Commonly known as: SYNTHROID  Take 1 tablet (25 mcg total) by mouth daily before breakfast.   tiZANidine  4 MG tablet Commonly known as: Zanaflex  Take 0.5 tablets (2 mg  total) by mouth 2 (two) times daily as needed for muscle spasms. Started by: Charlies Bellini   Vitamin D3 75 MCG (3000 UT) Tabs Take 3,000 Units by mouth daily.        All past medical history, surgical history, allergies, family history, immunizations andmedications were updated in the EMR today and reviewed under the history and medication portions of their EMR.     ROS Negative, with the exception of above mentioned in HPI   Objective:  BP 116/74   Pulse 72   Temp 97.9 F (36.6 C)   Wt 200 lb 12.8 oz (91.1 kg)   SpO2 96%   BMI 35.57 kg/m  Body mass index is 35.57 kg/m.  Physical Exam Vitals and nursing note reviewed.  Constitutional:      General: She is not in acute distress.    Appearance: Normal appearance. She is normal weight. She is not ill-appearing or toxic-appearing.  HENT:     Head: Normocephalic and atraumatic.  Eyes:     General: No scleral icterus.       Right eye: No discharge.        Left eye: No discharge.     Extraocular Movements: Extraocular movements intact.     Conjunctiva/sclera: Conjunctivae normal.     Pupils: Pupils are equal, round, and reactive to light.  Musculoskeletal:        General: Tenderness present. No swelling.     Left shoulder: Tenderness and bony tenderness present. Decreased range of motion. Decreased strength.     Comments: Patient has rather significant loss of range of motion in all planes.  Unable to abduct to 90 degrees.  Neurovascularly intact distally.  Skin:    Findings: No rash.  Neurological:  Mental Status: She is alert and oriented to person, place, and time. Mental status is at baseline.     Motor: No weakness.     Coordination: Coordination normal.     Gait: Gait normal.  Psychiatric:        Mood and Affect: Mood normal.        Behavior: Behavior normal.        Thought Content: Thought content normal.        Judgment: Judgment normal.      No results found. DG Shoulder Left Result Date:  08/15/2023 CLINICAL DATA:  left shoulder pain > 4 weeks. EXAM: LEFT SHOULDER - 2+ VIEW COMPARISON:  None Available. FINDINGS: No acute fracture or dislocation. No aggressive osseous lesion. Glenohumeral and acromioclavicular joints are normal in alignment and exhibit mild degenerative changes. No soft tissue swelling. No radiopaque foreign bodies. IMPRESSION: *No acute osseous abnormality of the left shoulder joint. Electronically Signed   By: Ree Molt M.D.   On: 08/15/2023 14:24   No results found for this or any previous visit (from the past 24 hours).  Assessment/Plan: Cathy Baxter is a 63 y.o. female present for OV for  Acute pain of left shoulder (Primary) Pain present greater than 4 weeks.  Patient does not recall an injury. Will obtain x-ray today to ensure shoulder is not dislocated. She has very limited range of motion in all fields probably due to resistance and pain. Unfortunately with her severe glaucoma, steroids cannot safely be used - DG Shoulder Left; Future - Ambulatory referral to Orthopedic Surgery -Diclofenac  twice daily with food prescribed -Zanaflex  2 mg twice daily as needed prescribed Referral to orthopedics placed for her for further management and workup of possible rotator etiology  Reviewed expectations re: course of current medical issues. Discussed self-management of symptoms. Outlined signs and symptoms indicating need for more acute intervention. Patient verbalized understanding and all questions were answered. Patient received an After-Visit Summary.    Orders Placed This Encounter  Procedures   DG Shoulder Left   Ambulatory referral to Orthopedic Surgery   Meds ordered this encounter  Medications   diclofenac  (VOLTAREN ) 75 MG EC tablet    Sig: Take 1 tablet (75 mg total) by mouth 2 (two) times daily.    Dispense:  60 tablet    Refill:  2   tiZANidine  (ZANAFLEX ) 4 MG tablet    Sig: Take 0.5 tablets (2 mg total) by mouth 2 (two) times daily  as needed for muscle spasms.    Dispense:  30 tablet    Refill:  2   Referral Orders         Ambulatory referral to Orthopedic Surgery       Note is dictated utilizing voice recognition software. Although note has been proof read prior to signing, occasional typographical errors still can be missed. If any questions arise, please do not hesitate to call for verification.   electronically signed by:  Charlies Bellini, DO  [facility] - OR

## 2023-08-15 NOTE — Telephone Encounter (Signed)
 Results and recommendations given to pt.

## 2023-08-17 ENCOUNTER — Telehealth: Payer: Self-pay

## 2023-08-17 NOTE — Telephone Encounter (Signed)
 Patient was seen 2 days ago, orthopedic referral was placed for her (not rheumatology).  It can take up to a week to hear from the specialty teams after being referred. That being said I see a MyChart message to her from an orthopedic pain that says that they have been trying to get a hold of her to schedule.  I am mildly only please contact [facility] (Orthopedics) one of our offices have received a referral from your healthcare provider. And we are trying to reach you to schedule and evaluation appointment. Please contact the office at [phone]

## 2023-08-17 NOTE — Telephone Encounter (Signed)
 Communication  Reason for CRM: Patient is returning a message she received from [facility], Patient was seen on 08/15/2023 and discussed the Rheumatology referral with [facility]. She just needs the referral to be placed.   Please advise. I do not see anything in OV note from 8/4 regarding a rheumatology referral.

## 2023-08-18 NOTE — Telephone Encounter (Signed)
 Left pt a vm with this info. Pt advised to call back with any questions.

## 2023-09-19 ENCOUNTER — Ambulatory Visit (INDEPENDENT_AMBULATORY_CARE_PROVIDER_SITE_OTHER): Admitting: Orthopaedic Surgery

## 2023-09-19 ENCOUNTER — Encounter: Payer: Self-pay | Admitting: Sports Medicine

## 2023-09-19 ENCOUNTER — Ambulatory Visit (INDEPENDENT_AMBULATORY_CARE_PROVIDER_SITE_OTHER): Admitting: Sports Medicine

## 2023-09-19 ENCOUNTER — Other Ambulatory Visit

## 2023-09-19 DIAGNOSIS — M25512 Pain in left shoulder: Secondary | ICD-10-CM

## 2023-09-19 DIAGNOSIS — M24612 Ankylosis, left shoulder: Secondary | ICD-10-CM | POA: Diagnosis not present

## 2023-09-19 DIAGNOSIS — G8929 Other chronic pain: Secondary | ICD-10-CM

## 2023-09-19 MED ORDER — TIZANIDINE HCL 4 MG PO TABS: 4.0000 mg | ORAL_TABLET | Freq: Three times a day (TID) | ORAL | 2 refills | Status: AC | PRN

## 2023-09-19 MED ORDER — LIDOCAINE HCL 1 % IJ SOLN
2.0000 mL | INTRAMUSCULAR | Status: AC | PRN
  Administered 2023-09-19: 2 mL

## 2023-09-19 MED ORDER — METHYLPREDNISOLONE ACETATE 40 MG/ML IJ SUSP
40.0000 mg | INTRAMUSCULAR | Status: AC | PRN
  Administered 2023-09-19: 40 mg via INTRA_ARTICULAR

## 2023-09-19 MED ORDER — BUPIVACAINE HCL 0.25 % IJ SOLN
2.0000 mL | INTRAMUSCULAR | Status: AC | PRN
  Administered 2023-09-19: 2 mL via INTRA_ARTICULAR

## 2023-09-19 NOTE — Progress Notes (Signed)
   Procedure Note  Patient: Cathy Baxter             Date of Birth: May 16, 1960           MRN: [id number]             Visit Date: 09/19/2023  Procedures: Visit Diagnoses:  1. Chronic left shoulder pain    Lab Results  Component Value Date   HGBA1C 5.3 05/12/2023   Large Joint Inj: L glenohumeral on 09/19/2023 3:26 PM Indications: pain Details: 22 G 3.5 in needle, ultrasound-guided posterior approach Medications: 2 mL lidocaine  1 %; 2 mL bupivacaine  0.25 %; 40 mg methylPREDNISolone  acetate 40 MG/ML Outcome: tolerated well, no immediate complications  US -guided glenohumeral joint injection, left shoulder After discussion on risks/benefits/indications, informed verbal consent was obtained. A timeout was then performed. The patient was positioned lying lateral recumbent on examination table. The patient's shoulder was prepped with betadine  and multiple alcohol swabs  and utilizing ultrasound guidance, the patient's glenohumeral joint was identified on ultrasound. Using ultrasound guidance a 22-gauge, 3.5 inch needle with a mixture of 2:2:1 cc's lidocaine :bupivicaine:depomedrol was directed from a lateral to medial direction via in-plane technique into the glenohumeral joint with visualization of appropriate spread of injectate into the joint. Patient tolerated the procedure well without immediate complications.      Procedure, treatment alternatives, risks and benefits explained, specific risks discussed. Consent was given by the patient. Immediately prior to procedure a time out was called to verify the correct patient, procedure, equipment, support staff and site/side marked as required. Patient was prepped and draped in the usual sterile fashion.     - patient tolerated procedure well, discussed post-injection protocol - follow-up with Dr. Vernetta as indicated; I am happy to see them as needed  Lonell Sprang, DO Primary Care Sports Medicine Physician  [facility] -  Orthopedics  This note was dictated using Dragon naturally speaking software and may contain errors in syntax, spelling, or content which have not been identified prior to signing this note.

## 2023-09-19 NOTE — Progress Notes (Signed)
 The patient is a 63 year old female who has developed worsening left shoulder pain for few months now with no known injury.  She does have x-rays of that shoulder on the canopy system from early August.  I did review those x-rays and her shoulder is well located with no acute findings.  She does have a history of severe glaucoma.  She is not had any type of therapy for her shoulder.  She tries to avoid any type of steroids due to her glaucoma.  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  The shoulder itself is well located.  I did review the x-rays of her left shoulder from early August and there is no acute findings.  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  I gave her an outpatient physical therapy prescription for [facility] physical therapy and we will send in tizanidine  as a muscle relaxant.  Even though she does have severe glaucoma it is worth having Dr. Burnetta see her for a one-time intra-articular steroid injection in the left shoulder joint to hopefully decrease her pain they can then help with mobility with therapy.  He can then get her back to us  in about 3 to 4 weeks after this injection.  I did give her a handwritten prescription for agrees to physical therapy.

## 2023-10-25 ENCOUNTER — Ambulatory Visit: Payer: Self-pay

## 2023-10-25 NOTE — Telephone Encounter (Signed)
Please advise in PCP absence.  

## 2023-10-25 NOTE — Telephone Encounter (Signed)
 FYI Only or Action Required?: Action required by provider: clinical question for provider.  Patient was last seen in primary care on 08/15/2023 by Catherine Fuller A, DO.  Called Nurse Triage reporting Leg Pain and Fatigue.  Symptoms began several days ago.  Interventions attempted: Rest, hydration, or home remedies.  Symptoms are: gradually worsening.  Triage Disposition: See PCP When Office is Open (Within 3 Days)  Patient/caregiver understands and will follow disposition?: No, wishes to speak with PCPpied from CRM #[id number]. Topic: Clinical - Red Word Triage >> Oct 25, 2023  1:03 PM Ashley R wrote: Red Word that prompted transfer to Nurse Triage: Legs fell like they are about to fall off from 40mg  dose of lipitor for cholesterol. Lots of sleeping/ aching all over Reason for Disposition  [1] MODERATE pain (e.g., interferes with normal activities, limping) AND [2] present > 3 days  Answer Assessment - Initial Assessment Questions Additional info: 1) Cardiology on November 4th.  2) Taking half dose lipitor due to side effects. She is requesting change of medication due to side effects. Asking for PCP or assistant to call her back today.  3) Refused appointment. Wants call back by pcp or assistant today to discuss.   1. ONSET: When did the pain start?      One weeks  2. LOCATION: Where is the pain located?      Bilateral legs  3. PAIN: How bad is the pain?    (Scale 1-10; or mild, moderate, severe)     Severe 10/10 able to walk  4. WORK OR EXERCISE: Has there been any recent work or exercise that involved this part of the body?      no 5. CAUSE: What do you think is causing the leg pain?     Lipitor  6. OTHER SYMPTOMS: Do you have any other symptoms? (e.g., chest pain, back pain, breathing difficulty, swelling, rash, fever, numbness, weakness)     Fatigue  7. PREGNANCY: Is there any chance you are pregnant? When was your last menstrual period?  Protocols used: Leg  Pain-A-AH

## 2023-10-31 ENCOUNTER — Ambulatory Visit: Admitting: Orthopaedic Surgery

## 2023-11-01 NOTE — Progress Notes (Deleted)
  Cardiology Office Note   Date:  11/01/2023  ID:  Cathy Baxter, DOB 12-13-1960, MRN [id number] PCP: Catherine Charlies LABOR, DO  [facility] Providers Cardiologist:  Dr. Sheena     History of Present Illness Cathy Baxter is a 63 y.o. female with past medical history of hypothyroidism, hiatal hernia, obesity, hyperlipidemia.  Followed by Dr. Sheena.  Presents today for 22-month follow-up appointment.  Patient previously wore cardiac monitor in 03/2020 for evaluation of palpitations.  Showed 4 episodes of SVT with the longest lasting 9 beats, rare PACs and PVCs.  Echocardiogram 03/2020 showed EF 60-65%, no regional wall motion abnormalities, normal LV diastolic parameters, normal RV systolic function, normal PA systolic pressure, no significant valvular abnormalities.  Had a sleep study in 02/2022 that showed no significant obstructive sleep apnea, no significant central sleep apnea.  Later underwent CT cardiac scoring in 03/2022 that showed a coronary calcium  score of 0.  She has been on Lipitor 40 mg daily for cholesterol management.  HLD  - Lipid panel from 05/2023 showed LDL 191, HDL 57, triglycerides 142, total cholesterol 277 - Coronary calcium  score 0 in 03/2022  - Continue lipitor 40 mg daily   Palpitations  - Previous cardiac monitor in 03/2020 showed 4 episodes of SVT with the longest lasting 9 beats, rare PVCs, PACs  - Sleep study in 02/2022 showed no sleep apnea   Hypothyroidism  - On synthroid , followed by PCP   ROS: ***  Studies Reviewed      *** Risk Assessment/Calculations {Does this patient have ATRIAL FIBRILLATION?:[phone]} No BP recorded.  {Refresh Note OR Click here to enter BP  :1}***       Physical Exam VS:  There were no vitals taken for this visit.       Wt Readings from Last 3 Encounters:  08/15/23 200 lb 12.8 oz (91.1 kg)  05/12/23 207 lb 12.8 oz (94.3 kg)  11/22/22 237 lb (107.5 kg)    GEN: Well nourished, well developed in no acute distress NECK:  No JVD; No carotid bruits CARDIAC: ***RRR, no murmurs, rubs, gallops RESPIRATORY:  Clear to auscultation without rales, wheezing or rhonchi  ABDOMEN: Soft, non-tender, non-distended EXTREMITIES:  No edema; No deformity   ASSESSMENT AND PLAN ***    {Are you ordering a CV Procedure (e.g. stress test, cath, DCCV, TEE, etc)?   Press F2        :[id number]}  Dispo: ***  Signed, Rollo FABIENE Louder, PA-C

## 2023-11-14 ENCOUNTER — Encounter: Payer: Self-pay | Admitting: Radiology

## 2023-11-15 ENCOUNTER — Ambulatory Visit: Attending: Cardiology | Admitting: Emergency Medicine

## 2023-11-15 ENCOUNTER — Encounter: Payer: Self-pay | Admitting: Emergency Medicine

## 2023-11-15 VITALS — BP 125/82 | HR 67 | Ht 63.0 in | Wt 203.6 lb

## 2023-11-15 DIAGNOSIS — E785 Hyperlipidemia, unspecified: Secondary | ICD-10-CM | POA: Diagnosis not present

## 2023-11-15 DIAGNOSIS — I491 Atrial premature depolarization: Secondary | ICD-10-CM | POA: Diagnosis present

## 2023-11-15 DIAGNOSIS — Z79899 Other long term (current) drug therapy: Secondary | ICD-10-CM | POA: Diagnosis present

## 2023-11-15 DIAGNOSIS — R42 Dizziness and giddiness: Secondary | ICD-10-CM | POA: Diagnosis present

## 2023-11-15 DIAGNOSIS — R002 Palpitations: Secondary | ICD-10-CM | POA: Insufficient documentation

## 2023-11-15 DIAGNOSIS — I471 Supraventricular tachycardia, unspecified: Secondary | ICD-10-CM | POA: Insufficient documentation

## 2023-11-15 MED ORDER — PITAVASTATIN CALCIUM 4 MG PO TABS: 4.0000 mg | ORAL_TABLET | Freq: Every day | ORAL | 3 refills | Status: AC

## 2023-11-15 MED ORDER — EZETIMIBE 10 MG PO TABS: 10.0000 mg | ORAL_TABLET | Freq: Every day | ORAL | 3 refills | Status: AC

## 2023-11-15 NOTE — Assessment & Plan Note (Addendum)
 EKG: Normal sinus rhythm, 67 bpm Reports occasional palpitations, unchanged over the last few years since previously seen by Dr. Sheena Denies chest pain, shortness of breath, near-syncope Patient is not interested in propranolol  prescription that was previously prescribed Reports she never took the prescribed propranolol  previously, will remove from med list.

## 2023-11-15 NOTE — Patient Instructions (Signed)
 Medication Instructions:  START: Pitavastatin  4 mg daily (1 tablet daily) START: Ezetimibe  (Zetia ) 10 mg daily (1 tablet daily) *If you need a refill on your cardiac medications before your next appointment, please call your pharmacy*  Lab Work: Today: Fasting Lipid 6 weeks: LFT, Fasting Lipid If you have labs (blood work) drawn today and your tests are completely normal, you will receive your results only by: MyChart Message (if you have MyChart) OR A paper copy in the mail If you have any lab test that is abnormal or we need to change your treatment, we will call you to review the results.  Testing/Procedures: NONE  Follow-Up: At [facility], you and your health needs are our priority.  As part of our continuing mission to provide you with exceptional heart care, our providers are all part of one team.  This team includes your primary Cardiologist (physician) and Advanced Practice Providers or APPs (Physician Assistants and Nurse Practitioners) who all work together to provide you with the care you need, when you need it.  Your next appointment:   6 month(s)  Provider:   Kardie Tobb, DO

## 2023-11-15 NOTE — Assessment & Plan Note (Addendum)
 Lipid panel 05/12/2023: Cholesterol 277, HDL 57, LDL 191, triglycerides 142 LFTs 05/12/2023: AST 19, ALT 24 Patient states she's fasting today, will order fasting lipids Patient stopped her atorvastatin  earlier this month after not tolerating it Will restart patient on previously well-tolerated pitavastatin  4 mg daily as well as as a Zetia  10 mg daily Will order repeat fasting lipids and LFTs to be drawn in 6 weeks for further monitoring.

## 2023-11-15 NOTE — Progress Notes (Signed)
 Cardiology Office Note:    Date:  11/15/2023   ID:  Cathy Baxter, DOB 1960/04/23, MRN [id number]  PCP:  Catherine Charlies LABOR, DO   [facility] Providers Cardiologist:  Dub Huntsman, DO     Referring MD: Catherine Charlies LABOR, DO   Chief complaint: Follow-up hyperlipidemia     History of Present Illness:   Cathy Baxter is a 63 y.o. female with a hx of PSVT/PACs, nocturnal hypoxemia/upper airway restriction syndrome by sleep study in 2024, hypertension, hypothyroidism, asthma, GERD, hiatal hernia, presenting to the clinic for follow-up of hyperlipidemia.  Presented to cardiology in February 2022 complaining of palpitations, SOB, fatigue.  Cardiac event monitoring and echo were ordered, as well as a sleep study.  1 week ZIO monitoring 03/17/2020: Min HR 53 bpm, max HR 200 bpm, average HR 77 bpm, underlying rhythm sinus rhythm, 4 SVT runs occurred, fastest and longest run was 9 beats with a max rate of 200 bpm.  PACs/PVCs were rare (<1%).  No ventricular tachycardia, no pauses, no block, atrial fibrillation present.  10 patient triggered events, 1 associated with SVT, remaining associated with PACs. Propranolol  ordered as needed for palpitations.  Echo 03/26/2020: LVEF 60-65%, normal LV function, no RWMA, diastolic parameters normal.  Normal RV, normal PA systolic pressure.  Normal mitral valve.  Normal aortic valve.  RA pressure 3 mmHg.  Sleep study 04/20/2020: Limited study due to poor sleep efficiency, no significant OSA occurred during limited sleep time of only 60.5 minutes with absent REM sleep.  Last seen in the cardiology office 12/14/2021 with Dr. Huntsman. Patient was being worked up for weight loss surgery. Was referred to the Pharm.D. weight loss program. She also complained of bilateral lower extremity edema, received Lasix  40 mg for 5 days.  Second sleep study in February 2024 showed no significant OSA.  Patient did have increased upper airway resistance and mild oxygen desaturation to  85%.  Diagnosed with nocturnal hypoxemia and increased upper airway resistance syndrome, did not meet criteria for CPAP.  It was recommended that if the patient continued to have significant daytime sleepiness, could consider follow-up sleep study with multiple latency sleep test to assess for idiopathic hypersomnia or narcolepsy.  Presents independently, appears stable from a cardiovascular standpoint. She denies chest pain, orthopnea, n, v, dark/tarry/bloody stools, hematuria, syncope, edema, weight gain.  Reports she stopped her asthma medicines at the beginning of this month, wants to talk to her allergist about switching to a different medication. Has occasionally felt DOE since stopping, not SOB at rest. O2 initially 93% after walking up from the parking lot, improved to 97% after resting in clinic.  Reports occasional spells of vertigo on waking in the morning, occurring over the last couple of days, has been taking allergy medicine recently for this and her sinus problems.  Reports occasional palpitations that are unchanged since her previous visits years ago.  Her primary reason for making her appointment today was to get restarted on her previous cholesterol medications.  States her PCP started her on atorvastatin  recently, which made her legs hurt, so she stopped taking those over the last month.   ROS:   Please see the history of present illness.    All other systems reviewed and are negative.     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 luke [facility]   Muscle tension dysphonia    voice hoarness---- ENT , dr h. hall   Pigmentary glaucoma of both eyes    Pneumonia    PSVT (paroxysmal supraventricular tachycardia) 2022   cardiologist--- dr marla. tobb;  zio monitor 03-17-2020 PSVT which is likely AT, rarely symptomic PACs;  echo 03-26-2020, normal;  cardiac CTA 03-17-2022  calcium  score=zero   Right knee meniscal tear    RLS (restless legs syndrome)    Urge urinary incontinence    Uterine fibroid    Varicose veins of both legs with edema    vascular--- dr christella. [place]   Wears partial dentures    upper    Past Surgical History:  Procedure Laterality Date   CATARACT EXTRACTION W/ INTRAOCULAR LENS IMPLANT Bilateral 2020   staged same year each eye,  glaucoma surgery w/ stent insertion   CHOLECYSTECTOMY, LAPAROSCOPIC  2002   COLONOSCOPY  2014   ESOPHAGOGASTRODUODENOSCOPY  10/04/2017   dr outlaw   HYSTEROSCOPY WITH D & C N/A 11/13/2020   Procedure: DILATATION AND CURETTAGE /HYSTEROSCOPY;  Surgeon: Dannielle Bouchard, DO;  Location: [facility] OR;  Service: Gynecology;  Laterality: N/A;  rep Therisa to cover case confirmed on 11/10/20 CS   KNEE ARTHROSCOPY Left 01/26/2018   @[facility]  by dr c. vernetta   KNEE ARTHROSCOPY Right 09/16/2022   Procedure: RIGHT KNEE ARTHROSCOPY WITH PARTIAL MEDIAL MENISCECTOMY;  Surgeon: Vernetta Lonni GRADE, MD;  Location: [facility];  Service: Orthopedics;  Laterality: Right;   NASAL SINUS SURGERY  2000    Current Medications: Current Meds  Medication Sig   albuterol  (VENTOLIN  HFA) 108 (90 Base) MCG/ACT inhaler Inhale 2 puffs into the lungs every 4 (four) hours as needed for wheezing or shortness of breath (coughing fits).   brimonidine (ALPHAGAN) 0.2 % ophthalmic solution Place 1 drop into both eyes 2 (two) times daily.   Cholecalciferol (VITAMIN D3) 75 MCG (3000 UT) TABS Take 3,000 Units by mouth daily.   diclofenac  (VOLTAREN ) 75 MG EC tablet Take 1 tablet (75 mg total) by  mouth 2 (two) times daily.   dorzolamide-timolol (COSOPT) 22.3-6.8 MG/ML ophthalmic solution Place 1 drop into both eyes 2 (two) times daily.   ezetimibe  (ZETIA ) 10 MG tablet Take 1 tablet (10 mg total) by mouth daily.   famotidine  (PEPCID ) 20 MG tablet Take 1 tablet (20 mg total) by mouth 2 (two) times daily. (Patient taking differently: Take 20 mg by mouth 2 (two) times daily as needed for heartburn.)   fluticasone -salmeterol (ADVAIR) 250-50 MCG/ACT AEPB Inhale 1 puff into the lungs in the morning and at bedtime. Rinse mouth after each use.   latanoprost (XALATAN) 0.005 % ophthalmic solution    levothyroxine  (SYNTHROID ) 25 MCG tablet Take 1 tablet (25 mcg total) by mouth daily before breakfast.   Pitavastatin  Calcium  4 MG TABS Take 1 tablet (4 mg total) by mouth daily.   tiZANidine  (ZANAFLEX ) 4 MG tablet Take 1 tablet (4 mg total) by mouth every 8 (eight) hours as needed for muscle spasms.     Allergies:   Azithromycin , Oxycodone, Penicillins, Amoxicillin, Doxycycline, Prednisone , and  Demerol [meperidine]   Social History   Socioeconomic History   Marital status: Divorced    Spouse name: Not on file   Number of children: Not on file   Years of education: 11   Highest education level: GED or equivalent  Occupational History   Occupation: stock  Tobacco Use   Smoking status: Former    Current packs/day: 0.00    Average packs/day: 3.0 packs/day for 12.0 years (36.0 ttl pk-yrs)    Types: Cigarettes    Start date: 07/12/1971    Quit date: 07/12/1983    Years since quitting: 40.3   Smokeless tobacco: Never   Tobacco comments:    09-07-2022  per pt quit smoking cigarettes 1985, smoked for 12 yrs  Vaping Use   Vaping status: Never Used  Substance and Sexual Activity   Alcohol use: Not Currently   Drug use: Never   Sexual activity: Yes    Partners: Male    Birth control/protection: Post-menopausal  Other Topics Concern   Not on file  Social History Narrative   Divorced. She has had  3 children, one passed away young from cancer.    11th grade education. Works at [employer].    Former smoker.   Drinks caffeine.    Wears seatbelt, smoke detector in the home.    Firearms in the home.    Feels safe in her relationships.    Social Drivers of Health   Financial Resource Strain: High Risk (05/11/2023)   Overall Financial Resource Strain (CARDIA)    Difficulty of Paying Living Expenses: Hard  Food Insecurity: Food Insecurity Present (05/11/2023)   Hunger Vital Sign    Worried About Running Out of Food in the Last Year: Sometimes true    Ran Out of Food in the Last Year: Sometimes true  Transportation Needs: No Transportation Needs (05/11/2023)   PRAPARE - Administrator, Civil Service (Medical): No    Lack of Transportation (Non-Medical): No  Physical Activity: Unknown (05/11/2023)   Exercise Vital Sign    Days of Exercise per Week: 0 days    Minutes of Exercise per Session: Not on file  Stress: No Stress Concern Present (05/11/2023)   [employer] of Occupational Health - Occupational Stress Questionnaire    Feeling of Stress : Only a little  Social Connections: Socially Isolated (05/11/2023)   Social Connection and Isolation Panel    Frequency of Communication with Friends and Family: Three times a week    Frequency of Social Gatherings with Friends and Family: Never    Attends Religious Services: Never    Database Administrator or Organizations: No    Attends Engineer, Structural: Not on file    Marital Status: Divorced     Family History: The patient's family history includes Asthma in her mother; Breast cancer (age of onset: 40) in her sister; COPD in her mother; Cancer (age of onset: 28) in her son; Celiac disease in her daughter; Early death in her father; Heart disease in her mother. There is no history of Allergic rhinitis, Eczema, Urticaria, Angioedema, Immunodeficiency, or Atopy.  EKGs/Labs/Other Studies Reviewed:    The following  studies were reviewed today:  EKG Interpretation Date/Time:  Tuesday November 15 2023 15:12:20 EST Ventricular Rate:  67 PR Interval:  178 QRS Duration:  76 QT Interval:  388 QTC Calculation: 409 R Axis:   -26  Text Interpretation: Normal sinus rhythm Normal ECG Confirmed by Blanch Stang [phone]) on 11/15/2023 4:08:59 PM  Recent Labs: 05/12/2023: ALT 24; BUN 15; Creatinine, Ser 0.74; Hemoglobin 14.2; Platelets 379.0; Potassium 4.5; Sodium 137; TSH 3.67  Recent Lipid Panel    Component Value Date/Time   CHOL 277 (H) 05/12/2023 1032   CHOL 256 (H) 12/14/2021 1508   TRIG 142.0 05/12/2023 1032   HDL 57.00 05/12/2023 1032   HDL 68 12/14/2021 1508   CHOLHDL 5 05/12/2023 1032   VLDL 28.4 05/12/2023 1032   LDLCALC 191 (H) 05/12/2023 1032   LDLCALC 168 (H) 12/14/2021 1508     Physical Exam:    VS:  BP 125/82   Pulse 67   Ht 5' 3 (1.6 m)   Wt 203 lb 9.6 oz (92.4 kg)   SpO2 97%   BMI 36.07 kg/m        Wt Readings from Last 3 Encounters:  11/15/23 203 lb 9.6 oz (92.4 kg)  08/15/23 200 lb 12.8 oz (91.1 kg)  05/12/23 207 lb 12.8 oz (94.3 kg)     GEN:  Well nourished, well developed in no acute distress HEENT: Normal NECK: No carotid bruits CARDIAC:  S1-S2 normal, RRR, no murmurs, rubs, gallops RESPIRATORY:  Clear to auscultation without rales, wheezing or rhonchi  MUSCULOSKELETAL:  No edema; No deformity  SKIN: Warm and dry NEUROLOGIC:  Alert and oriented x 3 PSYCHIATRIC:  Normal affect       Assessment & Plan Hyperlipidemia, unspecified hyperlipidemia type Medication management Lipid panel 05/12/2023: Cholesterol 277, HDL 57, LDL 191, triglycerides 142 LFTs 05/12/2023: AST 19, ALT 24 Patient states she's fasting today, will order fasting lipids Patient stopped her atorvastatin  earlier this month after not tolerating it Will restart patient on previously well-tolerated pitavastatin  4 mg daily as well as as a Zetia  10 mg daily Will order repeat fasting lipids and  LFTs to be drawn in 6 weeks for further monitoring. Palpitations Paroxysmal SVT (supraventricular tachycardia) PAC (premature atrial contraction) EKG: Normal sinus rhythm, 67 bpm Reports occasional palpitations, unchanged over the last few years since previously seen by Dr. Sheena Denies chest pain, shortness of breath, near-syncope Patient is not interested in propranolol  prescription that was previously prescribed Reports she never took the prescribed propranolol  previously, will remove from med list.  Vertigo Reports episode of sensation of room spinning from getting up on waking over the last two days. Occurs for a few minutes then passes. Not associated with new headache, vision changes, nausea, slurred speech, weakness. Does not occur with positional changes.  BP stable in clinic. No audible carotid bruit Likely related to BPPV, as she states she's had isolated instances of this in the past.  Recommending follow up with PCP Strict ED precautions for any changes, new, or worsening symptoms  Follow up in 6 months with Dr. Sheena       Medication Adjustments/Labs and Tests Ordered: Current medicines are reviewed at length with the patient today.  Concerns regarding medicines are outlined above.  Orders Placed This Encounter  Procedures   Lipid Profile   Hepatic function panel   Lipid Profile   EKG 12-Lead   Meds ordered this encounter  Medications   Pitavastatin  Calcium  4 MG TABS    Sig: Take 1 tablet (4 mg total) by mouth daily.    Dispense:  90 tablet    Refill:  3   ezetimibe  (ZETIA ) 10 MG tablet    Sig: Take 1 tablet (10 mg total) by mouth daily.    Dispense:  90 tablet    Refill:  3  Patient Instructions  Medication Instructions:  START: Pitavastatin  4 mg daily (1 tablet daily) START: Ezetimibe  (Zetia ) 10 mg daily (1 tablet daily) *If you need a refill on your cardiac medications before your next appointment, please call your pharmacy*  Lab Work: Today: Fasting  Lipid 6 weeks: LFT, Fasting Lipid If you have labs (blood work) drawn today and your tests are completely normal, you will receive your results only by: MyChart Message (if you have MyChart) OR A paper copy in the mail If you have any lab test that is abnormal or we need to change your treatment, we will call you to review the results.  Testing/Procedures: NONE  Follow-Up: At [facility], you and your health needs are our priority.  As part of our continuing mission to provide you with exceptional heart care, our providers are all part of one team.  This team includes your primary Cardiologist (physician) and Advanced Practice Providers or APPs (Physician Assistants and Nurse Practitioners) who all work together to provide you with the care you need, when you need it.  Your next appointment:   6 month(s)  Provider:   Kardie Tobb, DO              Signed, Kiran Carline E Liliana Dang, NP  11/15/2023 4:17 PM    [facility]

## 2023-11-16 ENCOUNTER — Ambulatory Visit: Payer: Self-pay | Admitting: Emergency Medicine

## 2023-11-16 LAB — LIPID PANEL
Chol/HDL Ratio: 4.4 ratio (ref 0.0–4.4)
Cholesterol, Total: 286 mg/dL — ABNORMAL HIGH (ref 100–199)
HDL: 65 mg/dL (ref >39)
LDL Chol Calc (NIH): 199 mg/dL — ABNORMAL HIGH (ref 0–99)
Triglycerides: 122 mg/dL (ref 0–149)
VLDL Cholesterol Cal: 22 mg/dL (ref 5–40)

## 2023-11-16 LAB — HEPATIC FUNCTION PANEL
ALT: 31 IU/L (ref 0–32)
AST: 24 IU/L (ref 0–40)
Albumin: 4.3 g/dL (ref 3.9–4.9)
Alkaline Phosphatase: 85 IU/L (ref 49–135)
Bilirubin Total: 0.4 mg/dL (ref 0.0–1.2)
Bilirubin, Direct: 0.14 mg/dL (ref 0.00–0.40)
Total Protein: 7.5 g/dL (ref 6.0–8.5)

## 2023-11-17 ENCOUNTER — Other Ambulatory Visit (HOSPITAL_COMMUNITY): Payer: Self-pay

## 2023-11-22 ENCOUNTER — Other Ambulatory Visit (HOSPITAL_COMMUNITY): Payer: Self-pay

## 2023-11-22 ENCOUNTER — Telehealth: Payer: Self-pay | Admitting: Cardiology

## 2023-11-22 NOTE — Telephone Encounter (Signed)
 Pt c/o medication issue:  1. Name of Medication:   Pitavastatin  Calcium  4 MG TABS    2. How are you currently taking this medication (dosage and times per day)?  Take 1 tablet (4 mg total) by mouth daily.      3. Are you having a reaction (difficulty breathing--STAT)? No  4. What is your medication issue? Pt is requesting a callback regarding a PA being needed for this medication. Pt stated the pharmacy has been trying to contact the office for one but no luck. Pt is requesting a callback so that she can pick up all of her medications at once. Please advise.

## 2023-11-22 NOTE — Telephone Encounter (Signed)
 Pharmacy Patient Advocate Encounter   Received notification from Fax that prior authorization for PITAVASTATIN  is required/requested.   Insurance verification completed.   The patient is insured through [facility] MEDICAID.   Per test claim: PA required; PA submitted to above mentioned insurance via [employer] Key/confirmation #/EOC [id number] W Status is pending

## 2023-11-24 ENCOUNTER — Other Ambulatory Visit (HOSPITAL_COMMUNITY): Payer: Self-pay

## 2023-11-30 ENCOUNTER — Other Ambulatory Visit (HOSPITAL_COMMUNITY): Payer: Self-pay

## 2023-11-30 NOTE — Telephone Encounter (Signed)
 Pharmacy Patient Advocate Encounter  Received notification from [place] MEDICAID that Prior Authorization for PITAVASTATIN  has been APPROVED from 11/2023 to 11/2024. Unable to obtain price due to refill too soon rejection, last fill date 11/28/23 FOR 90 DAY SUPPLY.
